# Patient Record
Sex: Female | Born: 1945 | Race: White | Hispanic: No | Marital: Married | Smoking: Former smoker
Health system: Southern US, Community
[De-identification: ages and names within clinical notes are randomized; demographics above are authoritative.]

## PROBLEM LIST (undated history)

## (undated) DIAGNOSIS — I10 Essential (primary) hypertension: Secondary | ICD-10-CM

## (undated) DIAGNOSIS — Z860100 Personal history of colon polyps, unspecified: Secondary | ICD-10-CM

## (undated) DIAGNOSIS — T4145XA Adverse effect of unspecified anesthetic, initial encounter: Secondary | ICD-10-CM

## (undated) DIAGNOSIS — T8859XA Other complications of anesthesia, initial encounter: Secondary | ICD-10-CM

## (undated) DIAGNOSIS — I82409 Acute embolism and thrombosis of unspecified deep veins of unspecified lower extremity: Secondary | ICD-10-CM

## (undated) DIAGNOSIS — K589 Irritable bowel syndrome without diarrhea: Secondary | ICD-10-CM

## (undated) DIAGNOSIS — R112 Nausea with vomiting, unspecified: Secondary | ICD-10-CM

## (undated) DIAGNOSIS — R011 Cardiac murmur, unspecified: Secondary | ICD-10-CM

## (undated) DIAGNOSIS — Z9889 Other specified postprocedural states: Secondary | ICD-10-CM

## (undated) DIAGNOSIS — M1990 Unspecified osteoarthritis, unspecified site: Secondary | ICD-10-CM

## (undated) DIAGNOSIS — R51 Headache: Secondary | ICD-10-CM

## (undated) DIAGNOSIS — R519 Headache, unspecified: Secondary | ICD-10-CM

## (undated) DIAGNOSIS — I809 Phlebitis and thrombophlebitis of unspecified site: Secondary | ICD-10-CM

## (undated) DIAGNOSIS — I739 Peripheral vascular disease, unspecified: Secondary | ICD-10-CM

## (undated) DIAGNOSIS — J449 Chronic obstructive pulmonary disease, unspecified: Secondary | ICD-10-CM

## (undated) DIAGNOSIS — Z8711 Personal history of peptic ulcer disease: Secondary | ICD-10-CM

## (undated) DIAGNOSIS — K219 Gastro-esophageal reflux disease without esophagitis: Secondary | ICD-10-CM

## (undated) HISTORY — PX: SHOULDER ARTHROSCOPY: SHX128

## (undated) HISTORY — PX: ANKLE SURGERY: SHX546

## (undated) HISTORY — PX: APPENDECTOMY: SHX54

## (undated) HISTORY — PX: FOOT SURGERY: SHX648

## (undated) HISTORY — PX: ESOPHAGOGASTRODUODENOSCOPY: SHX1529

## (undated) HISTORY — PX: COLONOSCOPY: SHX174

## (undated) HISTORY — PX: ABDOMINAL HYSTERECTOMY: SHX81

## (undated) HISTORY — PX: BACK SURGERY: SHX140

## (undated) HISTORY — PX: CHOLECYSTECTOMY: SHX55

---

## 1975-03-11 HISTORY — PX: ANKLE SURGERY: SHX546

## 1975-03-11 HISTORY — PX: OTHER SURGICAL HISTORY: SHX169

## 1985-03-10 HISTORY — PX: APPENDECTOMY: SHX54

## 1990-03-10 HISTORY — PX: CERVICAL FUSION: SHX112

## 1998-03-10 HISTORY — PX: OTHER SURGICAL HISTORY: SHX169

## 2002-03-10 HISTORY — PX: CHOLECYSTECTOMY: SHX55

## 2004-12-19 ENCOUNTER — Ambulatory Visit: Payer: Self-pay | Admitting: Internal Medicine

## 2005-04-18 ENCOUNTER — Ambulatory Visit: Payer: Self-pay | Admitting: Unknown Physician Specialty

## 2005-05-21 ENCOUNTER — Ambulatory Visit: Payer: Self-pay | Admitting: Cardiology

## 2006-01-27 ENCOUNTER — Ambulatory Visit: Payer: Self-pay | Admitting: Internal Medicine

## 2007-02-25 ENCOUNTER — Ambulatory Visit: Payer: Self-pay | Admitting: Internal Medicine

## 2008-02-29 ENCOUNTER — Ambulatory Visit: Payer: Self-pay | Admitting: Internal Medicine

## 2008-03-13 ENCOUNTER — Ambulatory Visit: Payer: Self-pay | Admitting: Internal Medicine

## 2009-03-14 ENCOUNTER — Ambulatory Visit: Payer: Self-pay | Admitting: Unknown Physician Specialty

## 2009-04-03 ENCOUNTER — Ambulatory Visit: Payer: Self-pay | Admitting: Unknown Physician Specialty

## 2009-06-11 ENCOUNTER — Ambulatory Visit: Payer: Self-pay | Admitting: Unknown Physician Specialty

## 2009-09-24 ENCOUNTER — Ambulatory Visit: Payer: Self-pay | Admitting: Unknown Physician Specialty

## 2010-03-19 ENCOUNTER — Ambulatory Visit: Payer: Self-pay | Admitting: Unknown Physician Specialty

## 2011-03-31 ENCOUNTER — Ambulatory Visit: Payer: Self-pay | Admitting: Unknown Physician Specialty

## 2012-03-10 HISTORY — PX: FOOT SURGERY: SHX648

## 2012-04-01 ENCOUNTER — Ambulatory Visit: Payer: Self-pay | Admitting: Podiatry

## 2012-04-07 ENCOUNTER — Emergency Department: Payer: Self-pay | Admitting: Emergency Medicine

## 2012-04-07 LAB — COMPREHENSIVE METABOLIC PANEL
Alkaline Phosphatase: 142 U/L — ABNORMAL HIGH (ref 50–136)
Anion Gap: 10 (ref 7–16)
BUN: 16 mg/dL (ref 7–18)
Calcium, Total: 9.2 mg/dL (ref 8.5–10.1)
Chloride: 106 mmol/L (ref 98–107)
EGFR (African American): >60
Osmolality: 285 (ref 275–301)
Potassium: 4.3 mmol/L (ref 3.5–5.1)
SGOT(AST): 92 U/L — ABNORMAL HIGH (ref 15–37)
SGPT (ALT): 110 U/L — ABNORMAL HIGH (ref 12–78)
Sodium: 141 mmol/L (ref 136–145)

## 2012-04-07 LAB — CBC
HCT: 38.9 % (ref 35.0–47.0)
HGB: 13.1 g/dL (ref 12.0–16.0)
MCHC: 33.8 g/dL (ref 32.0–36.0)
MCV: 100 fL (ref 80–100)
Platelet: 254 10*3/uL (ref 150–440)
WBC: 12.6 10*3/uL — ABNORMAL HIGH (ref 3.6–11.0)

## 2012-04-07 LAB — URINALYSIS, COMPLETE
Bilirubin,UR: NEGATIVE
Glucose,UR: NEGATIVE mg/dL (ref 0–75)
Ketone: NEGATIVE
Nitrite: NEGATIVE
Ph: 7 (ref 4.5–8.0)
Protein: NEGATIVE
RBC,UR: <1 /HPF (ref 0–5)
Specific Gravity: 1.009 (ref 1.003–1.030)
Squamous Epithelial: NONE SEEN
WBC UR: 1 /HPF (ref 0–5)

## 2012-04-19 ENCOUNTER — Ambulatory Visit: Payer: Self-pay | Admitting: Physician Assistant

## 2013-05-03 ENCOUNTER — Ambulatory Visit: Payer: Self-pay | Admitting: Physician Assistant

## 2013-05-16 ENCOUNTER — Ambulatory Visit: Payer: Self-pay | Admitting: Physician Assistant

## 2014-04-17 DIAGNOSIS — K582 Mixed irritable bowel syndrome: Secondary | ICD-10-CM | POA: Insufficient documentation

## 2014-04-17 DIAGNOSIS — Z86010 Personal history of colonic polyps: Secondary | ICD-10-CM | POA: Insufficient documentation

## 2014-05-05 ENCOUNTER — Ambulatory Visit: Payer: Self-pay | Admitting: Unknown Physician Specialty

## 2014-05-17 DIAGNOSIS — R519 Headache, unspecified: Secondary | ICD-10-CM | POA: Insufficient documentation

## 2014-05-17 DIAGNOSIS — R5382 Chronic fatigue, unspecified: Secondary | ICD-10-CM | POA: Insufficient documentation

## 2014-05-17 DIAGNOSIS — R202 Paresthesia of skin: Secondary | ICD-10-CM | POA: Insufficient documentation

## 2014-05-17 DIAGNOSIS — G2581 Restless legs syndrome: Secondary | ICD-10-CM | POA: Insufficient documentation

## 2014-07-03 LAB — SURGICAL PATHOLOGY

## 2014-11-15 ENCOUNTER — Other Ambulatory Visit: Payer: Self-pay | Admitting: Physician Assistant

## 2014-11-15 DIAGNOSIS — Z1231 Encounter for screening mammogram for malignant neoplasm of breast: Secondary | ICD-10-CM

## 2014-11-20 ENCOUNTER — Other Ambulatory Visit: Payer: Self-pay | Admitting: Physician Assistant

## 2014-11-20 ENCOUNTER — Ambulatory Visit
Admission: RE | Admit: 2014-11-20 | Discharge: 2014-11-20 | Disposition: A | Discharge status: Home / Self Care | Payer: Medicare Other | Source: Ambulatory Visit | Attending: Physician Assistant | Admitting: Physician Assistant

## 2014-11-20 DIAGNOSIS — Z1231 Encounter for screening mammogram for malignant neoplasm of breast: Secondary | ICD-10-CM

## 2015-02-12 ENCOUNTER — Emergency Department
Admission: EM | Admit: 2015-02-12 | Discharge: 2015-02-12 | Disposition: A | Discharge status: Home / Self Care | Payer: Medicare Other | Attending: Emergency Medicine | Admitting: Emergency Medicine

## 2015-02-12 ENCOUNTER — Emergency Department: Payer: Medicare Other

## 2015-02-12 DIAGNOSIS — R1013 Epigastric pain: Secondary | ICD-10-CM | POA: Diagnosis present

## 2015-02-12 DIAGNOSIS — Z91040 Latex allergy status: Secondary | ICD-10-CM | POA: Insufficient documentation

## 2015-02-12 LAB — TROPONIN I: Troponin I: <0.03 ng/mL (ref <0.031)

## 2015-02-12 LAB — COMPREHENSIVE METABOLIC PANEL
ALBUMIN: 4.5 g/dL (ref 3.5–5.0)
ALK PHOS: 62 U/L (ref 38–126)
ALT: 22 U/L (ref 14–54)
AST: 19 U/L (ref 15–41)
Anion gap: 6 (ref 5–15)
BUN: 11 mg/dL (ref 6–20)
CALCIUM: 9.7 mg/dL (ref 8.9–10.3)
CO2: 26 mmol/L (ref 22–32)
CREATININE: 0.75 mg/dL (ref 0.44–1.00)
Chloride: 106 mmol/L (ref 101–111)
GFR calc Af Amer: >60 mL/min (ref >60)
GLUCOSE: 101 mg/dL — AB (ref 65–99)
POTASSIUM: 3.7 mmol/L (ref 3.5–5.1)
Sodium: 138 mmol/L (ref 135–145)
TOTAL PROTEIN: 7.8 g/dL (ref 6.5–8.1)

## 2015-02-12 LAB — CBC
HEMATOCRIT: 38.3 % (ref 35.0–47.0)
HEMOGLOBIN: 13 g/dL (ref 12.0–16.0)
MCH: 34.2 pg — ABNORMAL HIGH (ref 26.0–34.0)
MCHC: 33.9 g/dL (ref 32.0–36.0)
MCV: 100.7 fL — AB (ref 80.0–100.0)
Platelets: 247 10*3/uL (ref 150–440)
RBC: 3.8 MIL/uL (ref 3.80–5.20)
RDW: 12.5 % (ref 11.5–14.5)
WBC: 5 10*3/uL (ref 3.6–11.0)

## 2015-02-12 LAB — LIPASE, BLOOD: LIPASE: 25 U/L (ref 11–51)

## 2015-02-12 MED ORDER — GI COCKTAIL ~~LOC~~
30.0000 mL | Freq: Once | ORAL | Status: AC
  Administered 2015-02-12: 30 mL via ORAL
  Filled 2015-02-12: qty 30

## 2015-02-12 NOTE — Discharge Instructions (Signed)
Abdominal Pain, Adult Many things can cause abdominal pain. Usually, abdominal pain is not caused by a disease and will improve without treatment. It can often be observed and treated at home. Your health care provider will do a physical exam and possibly order blood tests and X-rays to help determine the seriousness of your pain. However, in many cases, more time must pass before a clear cause of the pain can be found. Before that point, your health care provider may not know if you need more testing or further treatment. HOME CARE INSTRUCTIONS Monitor your abdominal pain for any changes. The following actions may help to alleviate any discomfort you are experiencing:  Only take over-the-counter or prescription medicines as directed by your health care provider.  Do not take laxatives unless directed to do so by your health care provider.  Try a clear liquid diet (broth, tea, or water) as directed by your health care provider. Slowly move to a bland diet as tolerated. SEEK MEDICAL CARE IF:  You have unexplained abdominal pain.  You have abdominal pain associated with nausea or diarrhea.  You have pain when you urinate or have a bowel movement.  You experience abdominal pain that wakes you in the night.  You have abdominal pain that is worsened or improved by eating food.  You have abdominal pain that is worsened with eating fatty foods.  You have a fever.  You have chest pain or shortness of breath SEEK IMMEDIATE MEDICAL CARE IF:  Your pain does not go away within 2 hours.  You keep throwing up (vomiting).  Your pain is felt only in portions of the abdomen, such as the right side or the left lower portion of the abdomen.  You pass bloody or black tarry stools. MAKE SURE YOU:  Understand these instructions.  Will watch your condition.  Will get help right away if you are not doing well or get worse.   This information is not intended to replace advice given to you by your  health care provider. Make sure you discuss any questions you have with your health care provider.   Document Released: 12/04/2004 Document Revised: 11/15/2014 Document Reviewed: 11/03/2012 Elsevier Interactive Patient Education [employer].

## 2015-02-12 NOTE — ED Notes (Signed)
Patient was on her way to [place] today. Patient was riding in car with her husband when symptoms began. States began with chest pressure in center of chest, radiated to back and then her mouth was numb. Numbness was gone after 5-10 minutes. Chest pressure has eased but still feels uncomfortable.

## 2015-02-12 NOTE — ED Provider Notes (Signed)
[facility] Emergency Department Provider Note  ____________________________________________   I have reviewed the triage vital signs and the nursing notes.   HISTORY  Chief Complaint Chest Pain    HPI Jeanette Harris is a 69 y.o. female with a history of "stomach problems", had multiple endoscopies and has had ulcers and polyps in her stomach. Patient also had a negative catheterization of her heart a few years ago. She has no history of CAD. 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 Patient states she has a leg somewhat spicy for lunch. Shortly thereafter around 1:00 she started having epigastric, not chest pain but epigastric pain which was a burning sensation that radiated towards her back and up towards her throat. She did have some burping associated with it. There was no radiation to the arms or legs no diaphoresis no shortness of breath no nausea no vomiting. The pain persists and is directly since that time. She has hadthis kind of pain with her stomach before. She states she did feel a burning on the back of her tongue. She denies any shortness of breath or dyspnea. She states that this is very similar to prior GI upset but she wants to be sure. She has not had any melena or bright red blood per rectum.  History reviewed. No pertinent past medical history.  There are no active problems to display for this patient.   History reviewed. No pertinent past surgical history.  No current outpatient prescriptions on file.  Allergies Ivp dye and Latex  Family History  Problem Relation Age of Onset  . Breast cancer Maternal Aunt     Social History Social History  Substance Use Topics  . Smoking status: Never Smoker   . Smokeless tobacco: None  . Alcohol Use: No    Review of Systems Constitutional: No fever/chills Eyes: No visual changes. ENT: No sore throat. No stiff neck no neck  pain Cardiovascular: Denies chest pain. Respiratory: Denies shortness of breath. Gastrointestinal:   no vomiting.  No diarrhea.  No constipation. Genitourinary: Negative for dysuria. Musculoskeletal: Negative lower extremity swelling Skin: Negative for rash. Neurological: Negative for headaches, focal weakness or numbness. 10-point ROS otherwise negative.  ____________________________________________   PHYSICAL EXAM:  VITAL SIGNS: ED Triage Vitals  Enc Vitals Group     BP 02/12/15 1334 131/70 mmHg     Pulse Rate 02/12/15 1334 82     Resp 02/12/15 1334 16     Temp 02/12/15 1334 98.3 F (36.8 C)     Temp Source 02/12/15 1334 Oral     SpO2 02/12/15 1334 98 %     Weight 02/12/15 1334 170 lb (77.111 kg)     Height 02/12/15 1334 5\' 5"  (1.651 m)     Head Cir --      Peak Flow --      Pain Score 02/12/15 1335 6     Pain Loc --      Pain Edu? --      Excl. in [place]? --     Constitutional: Alert and oriented. Well appearing and in no acute distress. Eyes: Conjunctivae are normal. PERRL. EOMI. Head: Atraumatic. Nose: No congestion/rhinnorhea. Mouth/Throat: Mucous membranes are moist.  Oropharynx non-erythematous. Neck: No stridor.   Nontender with no meningismus Cardiovascular: Normal rate, regular rhythm. Grossly normal heart sounds.  Good peripheral circulation. Respiratory: Normal respiratory effort.  No retractions. Lungs CTAB. Abdominal: There is positive mild tenderness to the  epigastric region which reproduces the patient's discomfort. By process the spot patient states "ouch that's the pain right there", there is no abdominal discomfort anywhere else in her abdomen on serial exams. No guarding no rebound Back:  There is no focal tenderness or step off there is no midline tenderness there are no lesions noted. there is no CVA tenderness Musculoskeletal: No lower extremity tenderness. No joint effusions, no DVT signs strong distal pulses no edema Neurologic:  Normal speech and  language. No gross focal neurologic deficits are appreciated.  Skin:  Skin is warm, dry and intact. No rash noted. Psychiatric: Mood and affect are normal. Speech and behavior are normal.  ____________________________________________   LABS (all labs ordered are listed, but only abnormal results are displayed)  Labs Reviewed  CBC - Abnormal; Notable for the following:    MCV 100.7 (*)    MCH 34.2 (*)    All other components within normal limits  COMPREHENSIVE METABOLIC PANEL - Abnormal; Notable for the following:    Glucose, Bld 101 (*)    Total Bilirubin <0.1 (*)    All other components within normal limits  TROPONIN I  TROPONIN I  LIPASE, BLOOD   ____________________________________________  EKG  I personally interpreted any EKGs ordered by me or triage EKG shows normal sinus rhythm, rate 85 bpm no acute ST elevation or acute ST depression normal axis unremarkable EKG ____________________________________________  RADIOLOGY  I reviewed any imaging ordered by me or triage that were performed during my shift ____________________________________________   PROCEDURES  Procedure(s) performed: None  Critical Care performed: None  ____________________________________________   INITIAL IMPRESSION / ASSESSMENT AND PLAN / ED COURSE  Pertinent labs & imaging results that were available during my care of the patient were reviewed by me and considered in my medical decision making (see chart for details).  Patient with reproducible epigastric abdominal discomfort who takes daily antiacid has a history of reflux and ulcer disease with no evidence of GI bleed. Low suspicion for ACS. Patient has had negative heart catheter in the past she states. We did do 2 sets of troponins which are negative. I do not feel that further cardiac workup is weren't at this time. Nor do I feel that further imaging of her abdomen is warranted. Patient has no pain the instant she took a GI cocktail she  felt better. While this is not diagnostic necessarily it certainly contributes to confirmatory supposition about her condition. The patient has been eager to go since she got here and now that her pain is completely gone she really would like to be discharged. She declines admission to the hospital and I do not think it is warranted anyway. We will discharge the patient with close follow-up with her doctor as well as her GI specialist all of whom she has information for already. Return precautions and follow-up given. ____________________________________________   FINAL CLINICAL IMPRESSION(S) / ED DIAGNOSES  Final diagnoses:  None     Schuyler Amor, MD 02/12/15 [phone]

## 2015-02-12 NOTE — ED Notes (Signed)
Sudden onset of chest pain at 13:15 with jaw numbness and dizziness.  Patient denies nausea, vomiting, or shortness of breath.

## 2015-07-07 ENCOUNTER — Ambulatory Visit
Admission: EM | Admit: 2015-07-07 | Discharge: 2015-07-07 | Disposition: A | Discharge status: Home / Self Care | Payer: Medicare Other | Attending: Family Medicine | Admitting: Family Medicine

## 2015-07-07 ENCOUNTER — Ambulatory Visit: Payer: Medicare Other

## 2015-07-07 DIAGNOSIS — S93402A Sprain of unspecified ligament of left ankle, initial encounter: Secondary | ICD-10-CM | POA: Diagnosis not present

## 2015-07-07 DIAGNOSIS — Z87891 Personal history of nicotine dependence: Secondary | ICD-10-CM | POA: Diagnosis not present

## 2015-07-07 DIAGNOSIS — W19XXXA Unspecified fall, initial encounter: Secondary | ICD-10-CM | POA: Insufficient documentation

## 2015-07-07 DIAGNOSIS — M79662 Pain in left lower leg: Secondary | ICD-10-CM | POA: Diagnosis present

## 2015-07-07 DIAGNOSIS — S8012XA Contusion of left lower leg, initial encounter: Secondary | ICD-10-CM | POA: Diagnosis not present

## 2015-07-07 NOTE — ED Notes (Signed)
Pain and swelling after dog tripped pt on Wednesday, causing her to fall. Pt is concerned because the swelling is not improving.

## 2015-07-07 NOTE — Discharge Instructions (Signed)
Take medication as prescribed. Rest. Apply ice and elevate. Wear brace at home as needed for support.   Follow up with orthopedic this week as needed for continued pain.   Follow up with your primary care physician this week as needed. Return to Urgent care for new or worsening concerns.    Ankle Sprain An ankle sprain is an injury to the strong, fibrous tissues (ligaments) that hold the bones of your ankle joint together.  CAUSES An ankle sprain is usually caused by a fall or by twisting your ankle. Ankle sprains most commonly occur when you step on the outer edge of your foot, and your ankle turns inward. People who participate in sports are more prone to these types of injuries.  SYMPTOMS   Pain in your ankle. The pain may be present at rest or only when you are trying to stand or walk.  Swelling.  Bruising. Bruising may develop immediately or within 1 to 2 days after your injury.  Difficulty standing or walking, particularly when turning corners or changing directions. DIAGNOSIS  Your caregiver will ask you details about your injury and perform a physical exam of your ankle to determine if you have an ankle sprain. During the physical exam, your caregiver will press on and apply pressure to specific areas of your foot and ankle. Your caregiver will try to move your ankle in certain ways. An X-ray exam may be done to be sure a bone was not broken or a ligament did not separate from one of the bones in your ankle (avulsion fracture).  TREATMENT  Certain types of braces can help stabilize your ankle. Your caregiver can make a recommendation for this. Your caregiver may recommend the use of medicine for pain. If your sprain is severe, your caregiver may refer you to a surgeon who helps to restore function to parts of your skeletal system (orthopedist) or a physical therapist. [place] ice to your injury for 1-2 days or as directed by your caregiver. Applying ice helps  to reduce inflammation and pain.  Put ice in a plastic bag.  Place a towel between your skin and the bag.  Leave the ice on for 15-20 minutes at a time, every 2 hours while you are awake.  Only take over-the-counter or prescription medicines for pain, discomfort, or fever as directed by your caregiver.  Elevate your injured ankle above the level of your heart as much as possible for 2-3 days.  If your caregiver recommends crutches, use them as instructed. Gradually put weight on the affected ankle. Continue to use crutches or a cane until you can walk without feeling pain in your ankle.  If you have a plaster splint, wear the splint as directed by your caregiver. Do not rest it on anything harder than a pillow for the first 24 hours. Do not put weight on it. Do not get it wet. You may take it off to take a shower or bath.  You may have been given an elastic bandage to wear around your ankle to provide support. If the elastic bandage is too tight (you have numbness or tingling in your foot or your foot becomes cold and blue), adjust the bandage to make it comfortable.  If you have an air splint, you may blow more air into it or let air out to make it more comfortable. You may take your splint off at night and before taking a shower or bath. Wiggle your toes in  the splint several times per day to decrease swelling. SEEK MEDICAL CARE IF:   You have rapidly increasing bruising or swelling.  Your toes feel extremely cold or you lose feeling in your foot.  Your pain is not relieved with medicine. SEEK IMMEDIATE MEDICAL CARE IF:  Your toes are numb or blue.  You have severe pain that is increasing. MAKE SURE YOU:   Understand these instructions.  Will watch your condition.  Will get help right away if you are not doing well or get worse.   This information is not intended to replace advice given to you by your health care provider. Make sure you discuss any questions you have with  your health care provider.   Document Released: 02/24/2005 Document Revised: 03/17/2014 Document Reviewed: 03/08/2011 Elsevier Interactive Patient Education 2016 [place] A contusion is a deep bruise. Contusions are the result of a blunt injury to tissues and muscle fibers under the skin. The injury causes bleeding under the skin. The skin overlying the contusion may turn blue, purple, or yellow. Minor injuries will give you a painless contusion, but more severe contusions may stay painful and swollen for a few weeks.  CAUSES  This condition is usually caused by a blow, trauma, or direct force to an area of the body. SYMPTOMS  Symptoms of this condition include:  Swelling of the injured area.  Pain and tenderness in the injured area.  Discoloration. The area may have redness and then turn blue, purple, or yellow. DIAGNOSIS  This condition is diagnosed based on a physical exam and medical history. An X-ray, CT scan, or MRI may be needed to determine if there are any associated injuries, such as broken bones (fractures). TREATMENT  Specific treatment for this condition depends on what area of the body was injured. In general, the best treatment for a contusion is resting, icing, applying pressure to (compression), and elevating the injured area. This is often called the RICE strategy. Over-the-counter anti-inflammatory medicines may also be recommended for pain control.  HOME CARE INSTRUCTIONS   Rest the injured area.  If directed, apply ice to the injured area:  Put ice in a plastic bag.  Place a towel between your skin and the bag.  Leave the ice on for 20 minutes, 2-3 times per day.  If directed, apply light compression to the injured area using an elastic bandage. Make sure the bandage is not wrapped too tightly. Remove and reapply the bandage as directed by your health care provider.  If possible, raise (elevate) the injured area above the level of your heart  while you are sitting or lying down.  Take over-the-counter and prescription medicines only as told by your health care provider. SEEK MEDICAL CARE IF:  Your symptoms do not improve after several days of treatment.  Your symptoms get worse.  You have difficulty moving the injured area. SEEK IMMEDIATE MEDICAL CARE IF:   You have severe pain.  You have numbness in a hand or foot.  Your hand or foot turns pale or cold.   This information is not intended to replace advice given to you by your health care provider. Make sure you discuss any questions you have with your health care provider.   Document Released: 12/04/2004 Document Revised: 11/15/2014 Document Reviewed: 07/12/2014 Elsevier Interactive Patient Education [employer].

## 2015-07-07 NOTE — ED Provider Notes (Signed)
[facility]  ____________________________________________  Time seen: Approximately 11:27 AM  I have reviewed the triage vital signs and the nursing notes.   HISTORY  Chief Complaint Ankle Pain    HPI Jeanette Harris is a 70 y.o. female presents with a complaint of left lower leg pain post injury. Patient reports that this past Wednesday evening she was outside and on her deck area reports that her dog which is a large puppy ran into her left lower leg causing her to roll her left ankle and fall. Denies any other pain or injury. Denies head injury or loss of consciousness. Reports that she came in today as she is concerned that she still has pain and swelling to left ankle and concern that it may be broken.  Patient reports that she has been able to continue to walk but with a limp. Patient states that pain at rest is mild but pain when walking increases. Denies any numbness or tingling sensation. Patient reports that she does have chronic decreased sensation to both lower legs but denies any changes acutely. Patient states that she only fell because the dog tripped her.  Denies weakness, dizziness, chest pain, shortness of breath, abdominal pain, neck pain, back pain, other extremity pain or other extremity swelling.    No past medical history on file.  There are no active problems to display for this patient.   No past surgical history on file.  Current Outpatient Rx  Name  Route  Sig  Dispense  Refill  . acetaminophen (TYLENOL) 500 MG tablet   Oral   Take 500 mg by mouth every 6 (six) hours as needed.         . calcium-vitamin D (OSCAL WITH D) 500-200 MG-UNIT tablet   Oral   Take 1 tablet by mouth.         . Cyanocobalamin (B-12 COMPLIANCE INJECTION IJ)   Injection   Inject as directed.         . docusate sodium (COLACE) 250 MG capsule   Oral   Take 250 mg by mouth daily.         Marland Kitchen gabapentin (NEURONTIN) 300 MG capsule   Oral   Take 300 mg by  mouth 3 (three) times daily.         . lansoprazole (PREVACID) 30 MG capsule   Oral   Take 30 mg by mouth daily at 12 noon.         . Multiple Vitamin (MULTIVITAMIN) capsule   Oral   Take 1 capsule by mouth daily.         . nortriptyline (PAMELOR) 75 MG capsule   Oral   Take 75 mg by mouth at bedtime.         . sucralfate (CARAFATE) 1 g tablet   Oral   Take 1 g by mouth 4 (four) times daily -  with meals and at bedtime.         . topiramate (TOPAMAX) 50 MG tablet   Oral   Take 50 mg by mouth 2 (two) times daily.           Allergies Aspirin; Ivp dye; Nsaids; and Latex  Family History  Problem Relation Age of Onset  . Breast cancer Maternal Aunt     Social History Social History  Substance Use Topics  . Smoking status: Former Research scientist (life sciences)  . Smokeless tobacco: None  . Alcohol Use: No    Review of Systems Constitutional: No fever/chills Eyes: No  visual changes. ENT: No sore throat. Cardiovascular: Denies chest pain. Respiratory: Denies shortness of breath. Gastrointestinal: No abdominal pain.  No nausea, no vomiting.  No diarrhea.  No constipation. Genitourinary: Negative for dysuria. Musculoskeletal: Negative for back pain.Positive left ankle pain. Skin: Negative for rash. Neurological: Negative for headaches, focal weakness or numbness.  10-point ROS otherwise negative.  ____________________________________________   PHYSICAL EXAM:  VITAL SIGNS: ED Triage Vitals  Enc Vitals Group     BP 07/07/15 1032 127/88 mmHg     Pulse Rate 07/07/15 1032 73     Resp 07/07/15 1032 16     Temp 07/07/15 1032 98 F (36.7 C)     Temp Source 07/07/15 1032 Oral     SpO2 07/07/15 1032 100 %     Weight 07/07/15 1032 170 lb (77.111 kg)     Height 07/07/15 1032 5\' 6"  (1.676 m)     Head Cir --      Peak Flow --      Pain Score 07/07/15 1034 7     Pain Loc --      Pain Edu? --      Excl. in [place]? --     Constitutional: Alert and oriented. Well appearing and in  no acute distress. Eyes: Conjunctivae are normal. PERRL. EOMI. Head: Atraumatic.  Nose: No congestion/rhinnorhea.  Mouth/Throat: Mucous membranes are moist.   Neck: No stridor.  No cervical spine tenderness to palpation. Cardiovascular: Normal rate, regular rhythm. Grossly normal heart sounds.  Good peripheral circulation. Respiratory: Normal respiratory effort.  No retractions. Lungs CTAB. No wheezes, rales or rhonchi. Good air movement. Gastrointestinal: Soft and nontender. Musculoskeletal: No lower or upper extremity tenderness nor edema.  Bilateral pedal pulses equal and easily palpated.  No cervical, thoracic or lumbar tenderness to palpation. Except: Left lateral mid to distal fibula mild tenderness to palpation, lateral malleolus moderate tenderness to palpation with mild swelling, lateral  proximal foot mild to moderate tenderness to palpation with mild swelling, mild ecchymosis, pain with ankle rotation and left foot plantar flexion and dorsiflexion, sensation intact bilaterally, capillary refill less than 2 seconds to all toes bilateral feet, no motor or tendon deficits to left foot. Mild antalgic gait.  Neurologic:  Normal speech and language. No gross focal neurologic deficits are appreciated. No gait instability. Skin:  Skin is warm, dry and intact. No rash noted. Psychiatric: Mood and affect are normal. Speech and behavior are normal.  ____________________________________________   LABS (all labs ordered are listed, but only abnormal results are displayed)  Labs Reviewed - No data to display  RADIOLOGY   EXAM: LEFT TIBIA AND FIBULA - 2 VIEW  COMPARISON: None.  FINDINGS: There is no evidence of fracture or other focal bone lesions. Soft tissues are unremarkable.  IMPRESSION: Negative.  Signed,  Dulcy Fanny. Earleen Newport, DO  [facility]  [facility]   Electronically Signed By: Corrie Mckusick D.O. On: 07/07/2015  11:56          DG Foot Complete Left (Final result) Result time: 07/07/15 11:57:42   Final result by Rad Results In Interface (07/07/15 11:57:42)   Narrative:   CLINICAL DATA: 70 year old female with a history of fall  EXAM: LEFT FOOT - COMPLETE 3+ VIEW  COMPARISON: None.  FINDINGS: No acute fracture.  Surgical changes of fixation of the left fifth metatarsal, as well as at the head of the second metatarsal.  No unexpected radiopaque foreign body. No focal soft tissue swelling. Degenerative changes of the interphalangeal  joints  IMPRESSION: Negative for acute bony abnormality.  Signed,  Dulcy Fanny. Earleen Newport, DO  [facility]  [facility]   Electronically Signed By: Corrie Mckusick D.O. On: 07/07/2015 11:57   I, Marylene Land, personally viewed and evaluated these images (plain radiographs) as part of my medical decision making, as well as reviewing the written report by the radiologist.  ____________________________________________   PROCEDURES  Procedure(s) performed:  Denies need for splint or crutches  _________________________________________   INITIAL IMPRESSION / [place] / ED COURSE  Pertinent labs & imaging results that were available during my care of the patient were reviewed by me and considered in my medical decision making (see chart for details).  Very well-appearing patient. No acute distress. Presents for the complaints of left lower extremity pain post mechanical fall and injury. Reports only feel because her dog ran into her ankle. Denies other pain or injury. Will evaluate left tib-fib and left foot x-rays.  Left tibia and fibula and left foot x-ray is negative for acute bony abnormality per radiology. Patient states that she has a ankle brace support at home, denies need for splinting, bracing or crutches in urgent care. Denies need for pain medication. Encouraged rest, ice,  elevation and follow up with PCP or orthopedics as needed for continued pain.  Discussed follow up with Primary care physician this week. Discussed follow up and return parameters including no resolution or any worsening concerns. Patient verbalized understanding and agreed to plan.   ____________________________________________    FINAL CLINICAL IMPRESSION(S) / ED DIAGNOSES  Final diagnoses:  Left ankle sprain, initial encounter  Multiple leg contusions, left, initial encounter      Note: This dictation was prepared with Dragon dictation along with smaller phrase technology. Any transcriptional errors that result from this process are unintentional.    Marylene Land, NP 07/07/15 1320

## 2015-11-27 ENCOUNTER — Other Ambulatory Visit: Payer: Self-pay | Admitting: Physician Assistant

## 2015-11-27 DIAGNOSIS — Z1231 Encounter for screening mammogram for malignant neoplasm of breast: Secondary | ICD-10-CM

## 2015-12-17 ENCOUNTER — Ambulatory Visit
Admission: RE | Admit: 2015-12-17 | Discharge: 2015-12-17 | Disposition: A | Discharge status: Home / Self Care | Payer: Medicare Other | Source: Ambulatory Visit | Attending: Physician Assistant | Admitting: Physician Assistant

## 2015-12-17 DIAGNOSIS — Z1231 Encounter for screening mammogram for malignant neoplasm of breast: Secondary | ICD-10-CM | POA: Diagnosis present

## 2016-02-07 NOTE — Discharge Instructions (Signed)
Cataract Surgery, Care After °Refer to this sheet in the next few weeks. These instructions provide you with information about caring for yourself after your procedure. Your health care provider may also give you more specific instructions. Your treatment has been planned according to current medical practices, but problems sometimes occur. Call your health care provider if you have any problems or questions after your procedure. °What can I expect after the procedure? °After the procedure, it is common to have: °· Itching. °· Discomfort. °· Fluid discharge. °· Sensitivity to light and to touch. °· Bruising. °Follow these instructions at home: °Eye Care  °· Check your eye every day for signs of infection. Watch for: °¨ Redness, swelling, or pain. °¨ Fluid, blood, or pus. °¨ Warmth. °¨ Bad smell. °Activity  °· Avoid strenuous activities, such as playing contact sports, for as long as told by your health care provider. °· Do not drive or operate heavy machinery until your health care provider approves. °· Do not bend or lift heavy objects . Bending increases pressure in the eye. You can walk, climb stairs, and do light household chores. °· Ask your health care provider when you can return to work. If you work in a dusty environment, you may be advised to wear protective eyewear for a period of time. °General instructions  °· Take or apply over-the-counter and prescription medicines only as told by your health care provider. This includes eye drops. °· Do not touch or rub your eyes. °· If you were given a protective shield, wear it as told by your health care provider. If you were not given a protective shield, wear sunglasses as told by your health care provider to protect your eyes. °· Keep the area around your eye clean and dry. Avoid swimming or allowing water to hit you directly in the face while showering until told by your health care provider. Keep soap and shampoo out of your eyes. °· Do not put a contact lens  into the affected eye or eyes until your health care provider approves. °· Keep all follow-up visits as told by your health care provider. This is important. °Contact a health care provider if: ° °· You have increased bruising around your eye. °· You have pain that is not helped with medicine. °· You have a fever. °· You have redness, swelling, or pain in your eye. °· You have fluid, blood, or pus coming from your incision. °· Your vision gets worse. °Get help right away if: °· You have sudden vision loss. °This information is not intended to replace advice given to you by your health care provider. Make sure you discuss any questions you have with your health care provider. °Document Released: 09/13/2004 Document Revised: 07/05/2015 Document Reviewed: 01/04/2015 °Elsevier Interactive Patient Education © 2017 Elsevier Inc[employer] ° ° ° ° °General Anesthesia, Adult, Care After °These instructions provide you with information about caring for yourself after your procedure. Your health care provider may also give you more specific instructions. Your treatment has been planned according to current medical practices, but problems sometimes occur. Call your health care provider if you have any problems or questions after your procedure. °What can I expect after the procedure? °After the procedure, it is common to have: °· Vomiting. °· A sore throat. °· Mental slowness. °It is common to feel: °· Nauseous. °· Cold or shivery. °· Sleepy. °· Tired. °· Sore or achy, even in parts of your body where you did not have surgery. °Follow these instructions at   home: °For at least 24 hours after the procedure:  °· Do not: °¨ Participate in activities where you could fall or become injured. °¨ Drive. °¨ Use heavy machinery. °¨ Drink alcohol. °¨ Take sleeping pills or medicines that cause drowsiness. °¨ Make important decisions or sign legal documents. °¨ Take care of children on your own. °· Rest. °Eating and drinking  °· If you vomit, drink  water, juice, or soup when you can drink without vomiting. °· Drink enough fluid to keep your urine clear or pale yellow. °· Make sure you have little or no nausea before eating solid foods. °· Follow the diet recommended by your health care provider. °General instructions  °· Have a responsible adult stay with you until you are awake and alert. °· Return to your normal activities as told by your health care provider. Ask your health care provider what activities are safe for you. °· Take over-the-counter and prescription medicines only as told by your health care provider. °· If you smoke, do not smoke without supervision. °· Keep all follow-up visits as told by your health care provider. This is important. °Contact a health care provider if: °· You continue to have nausea or vomiting at home, and medicines are not helpful. °· You cannot drink fluids or start eating again. °· You cannot urinate after 8-12 hours. °· You develop a skin rash. °· You have fever. °· You have increasing redness at the site of your procedure. °Get help right away if: °· You have difficulty breathing. °· You have chest pain. °· You have unexpected bleeding. °· You feel that you are having a life-threatening or urgent problem. °This information is not intended to replace advice given to you by your health care provider. Make sure you discuss any questions you have with your health care provider. °Document Released: 06/02/2000 Document Revised: 07/30/2015 Document Reviewed: 02/08/2015 °Elsevier Interactive Patient Education © 2017 Elsevier Inc. ° °

## 2016-02-13 ENCOUNTER — Encounter
Admission: RE | Disposition: A | Discharge status: Home / Self Care | Payer: Self-pay | Source: Ambulatory Visit | Attending: Ophthalmology

## 2016-02-13 ENCOUNTER — Ambulatory Visit
Admission: RE | Admit: 2016-02-13 | Discharge: 2016-02-13 | Disposition: A | Discharge status: Home / Self Care | Payer: Medicare Other | Source: Ambulatory Visit | Attending: Ophthalmology | Admitting: Ophthalmology

## 2016-02-13 ENCOUNTER — Ambulatory Visit: Payer: Medicare Other | Admitting: Anesthesiology

## 2016-02-13 DIAGNOSIS — H2512 Age-related nuclear cataract, left eye: Secondary | ICD-10-CM | POA: Diagnosis present

## 2016-02-13 DIAGNOSIS — K219 Gastro-esophageal reflux disease without esophagitis: Secondary | ICD-10-CM | POA: Diagnosis not present

## 2016-02-13 DIAGNOSIS — Z87891 Personal history of nicotine dependence: Secondary | ICD-10-CM | POA: Insufficient documentation

## 2016-02-13 DIAGNOSIS — J449 Chronic obstructive pulmonary disease, unspecified: Secondary | ICD-10-CM | POA: Insufficient documentation

## 2016-02-13 HISTORY — DX: Peripheral vascular disease, unspecified: I73.9

## 2016-02-13 HISTORY — DX: Phlebitis and thrombophlebitis of unspecified site: I80.9

## 2016-02-13 HISTORY — DX: Gastro-esophageal reflux disease without esophagitis: K21.9

## 2016-02-13 HISTORY — PX: CATARACT EXTRACTION W/PHACO: SHX586

## 2016-02-13 HISTORY — DX: Adverse effect of unspecified anesthetic, initial encounter: T41.45XA

## 2016-02-13 HISTORY — DX: Other complications of anesthesia, initial encounter: T88.59XA

## 2016-02-13 HISTORY — DX: Cardiac murmur, unspecified: R01.1

## 2016-02-13 HISTORY — DX: Other specified postprocedural states: Z98.890

## 2016-02-13 HISTORY — DX: Chronic obstructive pulmonary disease, unspecified: J44.9

## 2016-02-13 HISTORY — DX: Nausea with vomiting, unspecified: R11.2

## 2016-02-13 HISTORY — DX: Headache, unspecified: R51.9

## 2016-02-13 HISTORY — DX: Unspecified osteoarthritis, unspecified site: M19.90

## 2016-02-13 HISTORY — DX: Acute embolism and thrombosis of unspecified deep veins of unspecified lower extremity: I82.409

## 2016-02-13 HISTORY — DX: Headache: R51

## 2016-02-13 SURGERY — PHACOEMULSIFICATION, CATARACT, WITH IOL INSERTION
Anesthesia: Monitor Anesthesia Care | Laterality: Left | Wound class: Clean

## 2016-02-13 MED ORDER — EPINEPHRINE PF 1 MG/ML IJ SOLN
INTRAOCULAR | Status: DC | PRN
  Administered 2016-02-13: 63 mL via OPHTHALMIC

## 2016-02-13 MED ORDER — POLYMYXIN B-TRIMETHOPRIM 10000-0.1 UNIT/ML-% OP SOLN
1.0000 [drp] | OPHTHALMIC | Status: AC
  Administered 2016-02-13: 1 [drp] via OPHTHALMIC

## 2016-02-13 MED ORDER — TIMOLOL MALEATE 0.5 % OP SOLN
OPHTHALMIC | Status: DC | PRN
  Administered 2016-02-13: 1 [drp] via OPHTHALMIC

## 2016-02-13 MED ORDER — LIDOCAINE HCL (PF) 4 % IJ SOLN
INTRAOCULAR | Status: DC | PRN
  Administered 2016-02-13: 1 mL via OPHTHALMIC

## 2016-02-13 MED ORDER — CEFUROXIME OPHTHALMIC INJECTION 1 MG/0.1 ML
INJECTION | OPHTHALMIC | Status: DC | PRN
  Administered 2016-02-13: 0.1 mL via OPHTHALMIC

## 2016-02-13 MED ORDER — POLYMYXIN B-TRIMETHOPRIM 10000-0.1 UNIT/ML-% OP SOLN
1.0000 [drp] | OPHTHALMIC | Status: DC | PRN
  Administered 2016-02-13 (×2): 1 [drp] via OPHTHALMIC

## 2016-02-13 MED ORDER — MOXIFLOXACIN HCL 0.5 % OP SOLN: 1.0000 [drp] | OPHTHALMIC | Status: DC | PRN

## 2016-02-13 MED ORDER — ACETAMINOPHEN 160 MG/5ML PO SOLN: 325.0000 mg | ORAL | Status: DC | PRN

## 2016-02-13 MED ORDER — MIDAZOLAM HCL 2 MG/2ML IJ SOLN
INTRAMUSCULAR | Status: DC | PRN
  Administered 2016-02-13: 2 mg via INTRAVENOUS

## 2016-02-13 MED ORDER — FENTANYL CITRATE (PF) 100 MCG/2ML IJ SOLN
INTRAMUSCULAR | Status: DC | PRN
  Administered 2016-02-13: 100 ug via INTRAVENOUS

## 2016-02-13 MED ORDER — BRIMONIDINE TARTRATE 0.2 % OP SOLN
OPHTHALMIC | Status: DC | PRN
  Administered 2016-02-13: 1 [drp] via OPHTHALMIC

## 2016-02-13 MED ORDER — NA HYALUR & NA CHOND-NA HYALUR 0.4-0.35 ML IO KIT
PACK | INTRAOCULAR | Status: DC | PRN
  Administered 2016-02-13: 1 mL via INTRAOCULAR

## 2016-02-13 MED ORDER — ACETAMINOPHEN 325 MG PO TABS
325.0000 mg | ORAL_TABLET | ORAL | Status: DC | PRN
  Administered 2016-02-13: 650 mg via ORAL

## 2016-02-13 MED ORDER — ONDANSETRON HCL 4 MG/2ML IJ SOLN
INTRAMUSCULAR | Status: DC | PRN
  Administered 2016-02-13: 4 mg via INTRAVENOUS

## 2016-02-13 MED ORDER — ARMC OPHTHALMIC DILATING DROPS
1.0000 "application " | OPHTHALMIC | Status: DC | PRN
  Administered 2016-02-13 (×3): 1 via OPHTHALMIC

## 2016-02-13 SURGICAL SUPPLY — 27 items
CANNULA ANT/CHMB 27GA (MISCELLANEOUS) ×3 IMPLANT
CARTRIDGE ABBOTT (MISCELLANEOUS) IMPLANT
GLOVE SURG LX 7.5 STRW (GLOVE) ×2
GLOVE SURG LX STRL 7.5 STRW (GLOVE) ×1 IMPLANT
GLOVE SURG TRIUMPH 8.0 PF LTX (GLOVE) ×3 IMPLANT
GOWN STRL REUS W/ TWL LRG LVL3 (GOWN DISPOSABLE) ×2 IMPLANT
GOWN STRL REUS W/TWL LRG LVL3 (GOWN DISPOSABLE) ×4
LENS IOL ACRSF IQ TRC 5 21.5 (Intraocular Lens) ×1 IMPLANT
LENS IOL ACRYSOF IQ TORIC 21.5 (Intraocular Lens) ×2 IMPLANT
LENS IOL IQ TORIC 5 21.5 (Intraocular Lens) ×1 IMPLANT
MARKER SKIN DUAL TIP RULER LAB (MISCELLANEOUS) ×3 IMPLANT
NDL RETROBULBAR .5 NSTRL (NEEDLE) IMPLANT
NEEDLE FILTER BLUNT 18X 1/2SAF (NEEDLE) ×2
NEEDLE FILTER BLUNT 18X1 1/2 (NEEDLE) ×1 IMPLANT
PACK CATARACT BRASINGTON (MISCELLANEOUS) ×3 IMPLANT
PACK EYE AFTER SURG (MISCELLANEOUS) ×3 IMPLANT
PACK OPTHALMIC (MISCELLANEOUS) ×3 IMPLANT
RING MALYGIN 7.0 (MISCELLANEOUS) IMPLANT
SUT ETHILON 10-0 CS-B-6CS-B-6 (SUTURE)
SUT VICRYL  9 0 (SUTURE)
SUT VICRYL 9 0 (SUTURE) IMPLANT
SUTURE EHLN 10-0 CS-B-6CS-B-6 (SUTURE) IMPLANT
SYR 3ML LL SCALE MARK (SYRINGE) ×3 IMPLANT
SYR 5ML LL (SYRINGE) ×3 IMPLANT
SYR TB 1ML LUER SLIP (SYRINGE) ×3 IMPLANT
WATER STERILE IRR 250ML POUR (IV SOLUTION) ×3 IMPLANT
WIPE NON LINTING 3.25X3.25 (MISCELLANEOUS) ×3 IMPLANT

## 2016-02-13 NOTE — Op Note (Signed)
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   PROCEDURE:  Phacoemulsification with Toric posterior chamber intraocular lens placement of the left eye.   LENS:  Implant Name Type Inv. Item Serial No. Manufacturer Lot No. LRB No. Used  [id number] 21.5D     [id number] ALCON   Left 1   Toric intraocular lens with 3.0 diopters of cylindrical power with axis orientation at 73 degrees.   ULTRASOUND TIME: 13 % of 1 minutes, 8 seconds.  CDE 8.7   SURGEON:  Wyonia Hough, MD   ANESTHESIA:  Topical with tetracaine drops and 2% Xylocaine jelly, augmented with 1% preservative-free intracameral lidocaine.  COMPLICATIONS:  None.   DESCRIPTION OF PROCEDURE:  The patient was identified in the holding room and transported to the operating suite and placed in the supine position under the operating microscope.  The left eye was identified as the operative eye, and it was prepped and draped in the usual sterile ophthalmic fashion.    A clear-corneal paracentesis incision was made at the 1:30 position.  0.5 ml of preservative-free 1% lidocaine was injected into the anterior chamber. The anterior chamber was filled with Viscoat.  A 2.4 millimeter near clear corneal incision was then made at the 10:30 position.  A cystotome and capsulorrhexis forceps were then used to make a curvilinear capsulorrhexis.  Hydrodissection and hydrodelineation were then performed using balanced salt solution.   Phacoemulsification was then used in stop and chop fashion to remove the lens, nucleus and epinucleus.  The remaining cortex was aspirated using the irrigation and aspiration handpiece.  Provisc viscoelastic was then placed into the capsular bag to distend it for lens placement.  The Verion digital marker was used to align the implant at the intended axis.   A 21.5 diopter lens was  then injected into the capsular bag.  It was rotated clockwise until the axis marks on the lens were approximately 15 degrees in the counterclockwise direction to the intended alignment.  The viscoelastic was aspirated from the eye using the irrigation aspiration handpiece.  Then, a Koch spatula through the sideport incision was used to rotate the lens in a clockwise direction until the axis markings of the intraocular lens were lined up with the Verion alignment.  Balanced salt solution was then used to hydrate the wounds. Cefuroxime 0.1 ml of a 10mg /ml solution was injected into the anterior chamber for a dose of 1 mg of intracameral antibiotic at the completion of the case.    The eye was noted to have a physiologic pressure and there was no wound leak noted.   Timolol and Brimonidine drops were applied to the eye.  The patient was taken to the recovery room in stable condition having had no complications of anesthesia or surgery.  Jeanette Harris 02/13/2016, 11:32 AM

## 2016-02-13 NOTE — Anesthesia Procedure Notes (Signed)
Procedure Name: MAC Performed by: Corben Auzenne Pre-anesthesia Checklist: Patient identified, Emergency Drugs available, Suction available, Timeout performed and Patient being monitored Patient Re-evaluated:Patient Re-evaluated prior to inductionOxygen Delivery Method: Nasal cannula Placement Confirmation: positive ETCO2       

## 2016-02-13 NOTE — H&P (Signed)
The History and Physical notes are on paper, have been signed, and are to be scanned. The patient remains stable and unchanged from the H&P.   Previous H&P reviewed, patient examined, and there are no changes.  Dali Kraner 02/13/2016 10:11 AM

## 2016-02-13 NOTE — Transfer of Care (Signed)
Immediate Anesthesia Transfer of Care Note  Patient: Jeanette Harris  Procedure(s) Performed: Procedure(s) with comments: CATARACT EXTRACTION PHACO AND INTRAOCULAR LENS PLACEMENT (IOC) (Left) - TORIC  Patient Location: PACU  Anesthesia Type: MAC  Level of Consciousness: awake, alert  and patient cooperative  Airway and Oxygen Therapy: Patient Spontanous Breathing and Patient connected to supplemental oxygen  Post-op Assessment: Post-op Vital signs reviewed, Patient's Cardiovascular Status Stable, Respiratory Function Stable, Patent Airway and No signs of Nausea or vomiting  Post-op Vital Signs: Reviewed and stable  Complications: No apparent anesthesia complications

## 2016-02-13 NOTE — Anesthesia Preprocedure Evaluation (Signed)
Anesthesia Evaluation  Patient identified by MRN, date of birth, ID band Patient awake    Reviewed: Allergy & Precautions, H&P , NPO status , Patient's Chart, lab work & pertinent test results  Airway Mallampati: II  TM Distance: >3 FB Neck ROM: full    Dental no notable dental hx.    Pulmonary COPD, former smoker,    Pulmonary exam normal        Cardiovascular Normal cardiovascular exam     Neuro/Psych    GI/Hepatic GERD  ,  Endo/Other    Renal/GU      Musculoskeletal   Abdominal   Peds  Hematology   Anesthesia Other Findings   Reproductive/Obstetrics                             Anesthesia Physical Anesthesia Plan  ASA: II  Anesthesia Plan: MAC   Post-op Pain Management:    Induction:   Airway Management Planned:   Additional Equipment:   Intra-op Plan:   Post-operative Plan:   Informed Consent: I have reviewed the patients History and Physical, chart, labs and discussed the procedure including the risks, benefits and alternatives for the proposed anesthesia with the patient or authorized representative who has indicated his/her understanding and acceptance.     Plan Discussed with:   Anesthesia Plan Comments:         Anesthesia Quick Evaluation

## 2016-02-13 NOTE — Anesthesia Postprocedure Evaluation (Signed)
Anesthesia Post Note  Patient: Jeanette Harris  Procedure(s) Performed: Procedure(s) (LRB): CATARACT EXTRACTION PHACO AND INTRAOCULAR LENS PLACEMENT (IOC) (Left)  Patient location during evaluation: PACU Anesthesia Type: MAC Level of consciousness: awake and alert and oriented Pain management: satisfactory to patient Vital Signs Assessment: post-procedure vital signs reviewed and stable Respiratory status: spontaneous breathing, nonlabored ventilation and respiratory function stable Cardiovascular status: blood pressure returned to baseline and stable Postop Assessment: Adequate PO intake and No signs of nausea or vomiting Anesthetic complications: no    Raliegh Ip

## 2016-02-14 ENCOUNTER — Encounter: Payer: Self-pay | Admitting: Ophthalmology

## 2016-02-25 ENCOUNTER — Encounter: Payer: Self-pay | Admitting: *Deleted

## 2016-02-27 ENCOUNTER — Ambulatory Visit: Payer: Medicare Other | Admitting: Anesthesiology

## 2016-02-27 ENCOUNTER — Encounter
Admission: RE | Disposition: A | Discharge status: Home / Self Care | Payer: Self-pay | Source: Ambulatory Visit | Attending: Ophthalmology

## 2016-02-27 ENCOUNTER — Ambulatory Visit
Admission: RE | Admit: 2016-02-27 | Discharge: 2016-02-27 | Disposition: A | Discharge status: Home / Self Care | Payer: Medicare Other | Source: Ambulatory Visit | Attending: Ophthalmology | Admitting: Ophthalmology

## 2016-02-27 DIAGNOSIS — R011 Cardiac murmur, unspecified: Secondary | ICD-10-CM | POA: Insufficient documentation

## 2016-02-27 DIAGNOSIS — M1990 Unspecified osteoarthritis, unspecified site: Secondary | ICD-10-CM | POA: Insufficient documentation

## 2016-02-27 DIAGNOSIS — H2511 Age-related nuclear cataract, right eye: Secondary | ICD-10-CM | POA: Insufficient documentation

## 2016-02-27 DIAGNOSIS — Z87891 Personal history of nicotine dependence: Secondary | ICD-10-CM | POA: Diagnosis not present

## 2016-02-27 DIAGNOSIS — J449 Chronic obstructive pulmonary disease, unspecified: Secondary | ICD-10-CM | POA: Diagnosis not present

## 2016-02-27 DIAGNOSIS — K219 Gastro-esophageal reflux disease without esophagitis: Secondary | ICD-10-CM | POA: Insufficient documentation

## 2016-02-27 HISTORY — PX: CATARACT EXTRACTION W/PHACO: SHX586

## 2016-02-27 SURGERY — PHACOEMULSIFICATION, CATARACT, WITH IOL INSERTION
Anesthesia: Monitor Anesthesia Care | Laterality: Right | Wound class: Clean

## 2016-02-27 MED ORDER — BRIMONIDINE TARTRATE 0.2 % OP SOLN
OPHTHALMIC | Status: DC | PRN
  Administered 2016-02-27: 1 [drp] via OPHTHALMIC

## 2016-02-27 MED ORDER — TIMOLOL MALEATE 0.5 % OP SOLN
OPHTHALMIC | Status: DC | PRN
  Administered 2016-02-27: 1 [drp] via OPHTHALMIC

## 2016-02-27 MED ORDER — CEFUROXIME OPHTHALMIC INJECTION 1 MG/0.1 ML
INJECTION | OPHTHALMIC | Status: DC | PRN
  Administered 2016-02-27: .3 mL via OPHTHALMIC

## 2016-02-27 MED ORDER — MOXIFLOXACIN HCL 0.5 % OP SOLN
1.0000 [drp] | OPHTHALMIC | Status: DC | PRN
  Administered 2016-02-27 (×3): 1 [drp] via OPHTHALMIC

## 2016-02-27 MED ORDER — ACETAMINOPHEN 325 MG PO TABS
325.0000 mg | ORAL_TABLET | ORAL | Status: DC | PRN
Start: 2016-02-27 — End: 2016-02-27

## 2016-02-27 MED ORDER — NA HYALUR & NA CHOND-NA HYALUR 0.4-0.35 ML IO KIT
PACK | INTRAOCULAR | Status: DC | PRN
  Administered 2016-02-27: 1 mL via INTRAOCULAR

## 2016-02-27 MED ORDER — EPINEPHRINE PF 1 MG/ML IJ SOLN
INTRAMUSCULAR | Status: DC | PRN
  Administered 2016-02-27: 60 mL via OPHTHALMIC

## 2016-02-27 MED ORDER — MIDAZOLAM HCL 2 MG/2ML IJ SOLN
INTRAMUSCULAR | Status: DC | PRN
  Administered 2016-02-27: 2 mg via INTRAVENOUS

## 2016-02-27 MED ORDER — LIDOCAINE HCL (PF) 4 % IJ SOLN
INTRAOCULAR | Status: DC | PRN
  Administered 2016-02-27: 1.5 mL via OPHTHALMIC

## 2016-02-27 MED ORDER — ACETAMINOPHEN 160 MG/5ML PO SOLN: 325.0000 mg | ORAL | Status: DC | PRN

## 2016-02-27 MED ORDER — LACTATED RINGERS IV SOLN: INTRAVENOUS | Status: DC

## 2016-02-27 MED ORDER — ARMC OPHTHALMIC DILATING DROPS
1.0000 | OPHTHALMIC | Status: DC | PRN
Start: 2016-02-27 — End: 2016-02-27
  Administered 2016-02-27 (×2): 1 via OPHTHALMIC

## 2016-02-27 MED ORDER — FENTANYL CITRATE (PF) 100 MCG/2ML IJ SOLN
INTRAMUSCULAR | Status: DC | PRN
  Administered 2016-02-27: 100 ug via INTRAVENOUS

## 2016-02-27 SURGICAL SUPPLY — 27 items
CANNULA ANT/CHMB 27GA (MISCELLANEOUS) ×3 IMPLANT
CARTRIDGE ABBOTT (MISCELLANEOUS) IMPLANT
GLOVE SURG LX 7.5 STRW (GLOVE) ×2
GLOVE SURG LX STRL 7.5 STRW (GLOVE) ×1 IMPLANT
GLOVE SURG TRIUMPH 8.0 PF LTX (GLOVE) ×3 IMPLANT
GOWN STRL REUS W/ TWL LRG LVL3 (GOWN DISPOSABLE) ×2 IMPLANT
GOWN STRL REUS W/TWL LRG LVL3 (GOWN DISPOSABLE) ×4
LENS IOL ACRSF IQ TRC 5 20.5 (Intraocular Lens) ×1 IMPLANT
LENS IOL ACRYSOF IQ TORIC 20.5 (Intraocular Lens) ×2 IMPLANT
LENS IOL IQ TORIC 5 20.5 (Intraocular Lens) ×1 IMPLANT
MARKER SKIN DUAL TIP RULER LAB (MISCELLANEOUS) ×3 IMPLANT
NDL RETROBULBAR .5 NSTRL (NEEDLE) IMPLANT
NEEDLE FILTER BLUNT 18X 1/2SAF (NEEDLE) ×2
NEEDLE FILTER BLUNT 18X1 1/2 (NEEDLE) ×1 IMPLANT
PACK CATARACT BRASINGTON (MISCELLANEOUS) ×3 IMPLANT
PACK EYE AFTER SURG (MISCELLANEOUS) ×3 IMPLANT
PACK OPTHALMIC (MISCELLANEOUS) ×3 IMPLANT
RING MALYGIN 7.0 (MISCELLANEOUS) IMPLANT
SUT ETHILON 10-0 CS-B-6CS-B-6 (SUTURE)
SUT VICRYL  9 0 (SUTURE)
SUT VICRYL 9 0 (SUTURE) IMPLANT
SUTURE EHLN 10-0 CS-B-6CS-B-6 (SUTURE) IMPLANT
SYR 3ML LL SCALE MARK (SYRINGE) ×3 IMPLANT
SYR 5ML LL (SYRINGE) ×3 IMPLANT
SYR TB 1ML LUER SLIP (SYRINGE) ×3 IMPLANT
WATER STERILE IRR 250ML POUR (IV SOLUTION) ×3 IMPLANT
WIPE NON LINTING 3.25X3.25 (MISCELLANEOUS) ×3 IMPLANT

## 2016-02-27 NOTE — Anesthesia Preprocedure Evaluation (Signed)
Anesthesia Evaluation  Patient identified by MRN, date of birth, ID band Patient awake    Reviewed: Allergy & Precautions, H&P , NPO status , Patient's Chart, lab work & pertinent test results  Airway Mallampati: II  TM Distance: >3 FB Neck ROM: full    Dental no notable dental hx.    Pulmonary COPD, former smoker,    Pulmonary exam normal        Cardiovascular Normal cardiovascular exam     Neuro/Psych    GI/Hepatic GERD  ,  Endo/Other    Renal/GU      Musculoskeletal   Abdominal   Peds  Hematology   Anesthesia Other Findings   Reproductive/Obstetrics                             Anesthesia Physical  Anesthesia Plan  ASA: II  Anesthesia Plan: MAC   Post-op Pain Management:    Induction:   Airway Management Planned:   Additional Equipment:   Intra-op Plan:   Post-operative Plan:   Informed Consent: I have reviewed the patients History and Physical, chart, labs and discussed the procedure including the risks, benefits and alternatives for the proposed anesthesia with the patient or authorized representative who has indicated his/her understanding and acceptance.     Plan Discussed with:   Anesthesia Plan Comments:         Anesthesia Quick Evaluation

## 2016-02-27 NOTE — Anesthesia Postprocedure Evaluation (Signed)
Anesthesia Post Note  Patient: Rayen L Kinlaw  Procedure(s) Performed: Procedure(s) (LRB): CATARACT EXTRACTION PHACO AND INTRAOCULAR LENS PLACEMENT (IOC) (Right)  Patient location during evaluation: PACU Anesthesia Type: MAC Level of consciousness: awake and alert and oriented Pain management: satisfactory to patient Vital Signs Assessment: post-procedure vital signs reviewed and stable Respiratory status: spontaneous breathing, nonlabored ventilation and respiratory function stable Cardiovascular status: blood pressure returned to baseline and stable Postop Assessment: Adequate PO intake and No signs of nausea or vomiting Anesthetic complications: no    Raliegh Ip

## 2016-02-27 NOTE — Discharge Instructions (Signed)
Cataract Surgery, Care After °Refer to this sheet in the next few weeks. These instructions provide you with information about caring for yourself after your procedure. Your health care provider may also give you more specific instructions. Your treatment has been planned according to current medical practices, but problems sometimes occur. Call your health care provider if you have any problems or questions after your procedure. °What can I expect after the procedure? °After the procedure, it is common to have: °· Itching. °· Discomfort. °· Fluid discharge. °· Sensitivity to light and to touch. °· Bruising. °Follow these instructions at home: °Eye Care  °· Check your eye every day for signs of infection. Watch for: °¨ Redness, swelling, or pain. °¨ Fluid, blood, or pus. °¨ Warmth. °¨ Bad smell. °Activity  °· Avoid strenuous activities, such as playing contact sports, for as long as told by your health care provider. °· Do not drive or operate heavy machinery until your health care provider approves. °· Do not bend or lift heavy objects . Bending increases pressure in the eye. You can walk, climb stairs, and do light household chores. °· Ask your health care provider when you can return to work. If you work in a dusty environment, you may be advised to wear protective eyewear for a period of time. °General instructions  °· Take or apply over-the-counter and prescription medicines only as told by your health care provider. This includes eye drops. °· Do not touch or rub your eyes. °· If you were given a protective shield, wear it as told by your health care provider. If you were not given a protective shield, wear sunglasses as told by your health care provider to protect your eyes. °· Keep the area around your eye clean and dry. Avoid swimming or allowing water to hit you directly in the face while showering until told by your health care provider. Keep soap and shampoo out of your eyes. °· Do not put a contact lens  into the affected eye or eyes until your health care provider approves. °· Keep all follow-up visits as told by your health care provider. This is important. °Contact a health care provider if: ° °· You have increased bruising around your eye. °· You have pain that is not helped with medicine. °· You have a fever. °· You have redness, swelling, or pain in your eye. °· You have fluid, blood, or pus coming from your incision. °· Your vision gets worse. °Get help right away if: °· You have sudden vision loss. °This information is not intended to replace advice given to you by your health care provider. Make sure you discuss any questions you have with your health care provider. °Document Released: 09/13/2004 Document Revised: 07/05/2015 Document Reviewed: 01/04/2015 °Elsevier Interactive Patient Education © 2017 Elsevier Inc[employer] ° ° ° ° °General Anesthesia, Adult, Care After °These instructions provide you with information about caring for yourself after your procedure. Your health care provider may also give you more specific instructions. Your treatment has been planned according to current medical practices, but problems sometimes occur. Call your health care provider if you have any problems or questions after your procedure. °What can I expect after the procedure? °After the procedure, it is common to have: °· Vomiting. °· A sore throat. °· Mental slowness. °It is common to feel: °· Nauseous. °· Cold or shivery. °· Sleepy. °· Tired. °· Sore or achy, even in parts of your body where you did not have surgery. °Follow these instructions at   home: °For at least 24 hours after the procedure:  °· Do not: °¨ Participate in activities where you could fall or become injured. °¨ Drive. °¨ Use heavy machinery. °¨ Drink alcohol. °¨ Take sleeping pills or medicines that cause drowsiness. °¨ Make important decisions or sign legal documents. °¨ Take care of children on your own. °· Rest. °Eating and drinking  °· If you vomit, drink  water, juice, or soup when you can drink without vomiting. °· Drink enough fluid to keep your urine clear or pale yellow. °· Make sure you have little or no nausea before eating solid foods. °· Follow the diet recommended by your health care provider. °General instructions  °· Have a responsible adult stay with you until you are awake and alert. °· Return to your normal activities as told by your health care provider. Ask your health care provider what activities are safe for you. °· Take over-the-counter and prescription medicines only as told by your health care provider. °· If you smoke, do not smoke without supervision. °· Keep all follow-up visits as told by your health care provider. This is important. °Contact a health care provider if: °· You continue to have nausea or vomiting at home, and medicines are not helpful. °· You cannot drink fluids or start eating again. °· You cannot urinate after 8-12 hours. °· You develop a skin rash. °· You have fever. °· You have increasing redness at the site of your procedure. °Get help right away if: °· You have difficulty breathing. °· You have chest pain. °· You have unexpected bleeding. °· You feel that you are having a life-threatening or urgent problem. °This information is not intended to replace advice given to you by your health care provider. Make sure you discuss any questions you have with your health care provider. °Document Released: 06/02/2000 Document Revised: 07/30/2015 Document Reviewed: 02/08/2015 °Elsevier Interactive Patient Education © 2017 Elsevier Inc. ° °

## 2016-02-27 NOTE — Transfer of Care (Signed)
Immediate Anesthesia Transfer of Care Note  Patient: Jeanette Harris  Procedure(s) Performed: Procedure(s) with comments: CATARACT EXTRACTION PHACO AND INTRAOCULAR LENS PLACEMENT (IOC) (Right) - TORIC Latex sensitivity  Patient Location: PACU  Anesthesia Type: MAC  Level of Consciousness: awake, alert  and patient cooperative  Airway and Oxygen Therapy: Patient Spontanous Breathing and Patient connected to supplemental oxygen  Post-op Assessment: Post-op Vital signs reviewed, Patient's Cardiovascular Status Stable, Respiratory Function Stable, Patent Airway and No signs of Nausea or vomiting  Post-op Vital Signs: Reviewed and stable  Complications: No apparent anesthesia complications

## 2016-02-27 NOTE — Anesthesia Procedure Notes (Signed)
Procedure Name: MAC Date/Time: 02/27/2016 11:50 AM Performed by: Janna Arch Pre-anesthesia Checklist: Patient identified, Emergency Drugs available, Suction available and Patient being monitored Patient Re-evaluated:Patient Re-evaluated prior to inductionOxygen Delivery Method: Nasal cannula

## 2016-02-27 NOTE — H&P (Signed)
The History and Physical notes are on paper, have been signed, and are to be scanned. The patient remains stable and unchanged from the H&P.   Previous H&P reviewed, patient examined, and there are no changes.  Jeanette Harris 02/27/2016 11:00 AM

## 2016-02-27 NOTE — Op Note (Signed)
LOCATION:  [place]   PREOPERATIVE DIAGNOSIS:  Nuclear sclerotic cataract of the right eye.  H25.11   POSTOPERATIVE DIAGNOSIS:  Nuclear sclerotic cataract of the right eye.   PROCEDURE:  Phacoemulsification with Toric posterior chamber intraocular lens placement of the right eye.   LENS:   Implant Name Type Inv. Item Serial No. Manufacturer Lot No. LRB No. Used  AcrySof IQ  IOL  Intraocular Lens   [id number] ALCON   Right 1     [id number] 20.5 D Toric intraocular lens with 3.0 diopters of cylindrical power with axis orientation at 92 degrees.   ULTRASOUND TIME: 16 % of 0 minutes, 53 seconds.  CDE 8.4   SURGEON:  Wyonia Hough, MD   ANESTHESIA: Topical with tetracaine drops and 2% Xylocaine jelly, augmented with 1% preservative-free intracameral lidocaine. .   COMPLICATIONS:  None.   DESCRIPTION OF PROCEDURE:  The patient was identified in the holding room and transported to the operating suite and placed in the supine position under the operating microscope.  The right eye was identified as the operative eye, and it was prepped and draped in the usual sterile ophthalmic fashion.    A clear-corneal paracentesis incision was made at the 12:00 position.  0.5 ml of preservative-free 1% lidocaine was injected into the anterior chamber. The anterior chamber was filled with Viscoat.  A 2.4 millimeter near clear corneal incision was then made at the 9:00 position.  A cystotome and capsulorrhexis forceps were then used to make a curvilinear capsulorrhexis.  Hydrodissection and hydrodelineation were then performed using balanced salt solution.   Phacoemulsification was then used in stop and chop fashion to remove the lens, nucleus and epinucleus.  The remaining cortex was aspirated using the irrigation and aspiration handpiece.  Provisc viscoelastic was then placed into the capsular bag to distend it for lens placement.  The Verion digital marker was used to align the implant at  the intended axis.   A Toric lens was then injected into the capsular bag.  It was rotated clockwise until the axis marks on the lens were approximately 15 degrees in the counterclockwise direction to the intended alignment.  The viscoelastic was aspirated from the eye using the irrigation aspiration handpiece.  Then, a Koch spatula through the sideport incision was used to rotate the lens in a clockwise direction until the axis markings of the intraocular lens were lined up with the Verion alignment.  Balanced salt solution was then used to hydrate the wounds. Cefuroxime 0.1 ml of a 10mg /ml solution was injected into the anterior chamber for a dose of 1 mg of intracameral antibiotic at the completion of the case.    The eye was noted to have a physiologic pressure and there was no wound leak noted.   Timolol and Brimonidine drops were applied to the eye.  The patient was taken to the recovery room in stable condition having had no complications of anesthesia or surgery.  Amelio Brosky 02/27/2016, 12:09 PM

## 2016-02-28 ENCOUNTER — Encounter: Payer: Self-pay | Admitting: Ophthalmology

## 2016-06-12 DIAGNOSIS — E782 Mixed hyperlipidemia: Secondary | ICD-10-CM | POA: Insufficient documentation

## 2016-12-10 ENCOUNTER — Other Ambulatory Visit: Payer: Self-pay | Admitting: Physician Assistant

## 2016-12-10 DIAGNOSIS — Z1231 Encounter for screening mammogram for malignant neoplasm of breast: Secondary | ICD-10-CM

## 2016-12-22 ENCOUNTER — Ambulatory Visit
Admission: RE | Admit: 2016-12-22 | Discharge: 2016-12-22 | Disposition: A | Discharge status: Home / Self Care | Payer: Medicare Other | Source: Ambulatory Visit | Attending: Physician Assistant | Admitting: Physician Assistant

## 2016-12-22 DIAGNOSIS — Z1231 Encounter for screening mammogram for malignant neoplasm of breast: Secondary | ICD-10-CM

## 2017-01-02 ENCOUNTER — Other Ambulatory Visit: Payer: Self-pay | Admitting: Physician Assistant

## 2017-01-02 DIAGNOSIS — N6310 Unspecified lump in the right breast, unspecified quadrant: Secondary | ICD-10-CM

## 2017-01-02 DIAGNOSIS — N63 Unspecified lump in unspecified breast: Secondary | ICD-10-CM

## 2017-01-02 DIAGNOSIS — N6320 Unspecified lump in the left breast, unspecified quadrant: Secondary | ICD-10-CM

## 2017-01-05 ENCOUNTER — Other Ambulatory Visit: Payer: Self-pay | Admitting: Physician Assistant

## 2017-01-05 DIAGNOSIS — N6459 Other signs and symptoms in breast: Secondary | ICD-10-CM

## 2017-01-07 ENCOUNTER — Ambulatory Visit
Admission: RE | Admit: 2017-01-07 | Discharge: 2017-01-07 | Disposition: A | Discharge status: Home / Self Care | Payer: Medicare Other | Source: Ambulatory Visit | Attending: Physician Assistant | Admitting: Physician Assistant

## 2017-01-07 ENCOUNTER — Other Ambulatory Visit: Payer: Self-pay | Admitting: Physician Assistant

## 2017-01-07 DIAGNOSIS — Z1231 Encounter for screening mammogram for malignant neoplasm of breast: Secondary | ICD-10-CM

## 2017-01-07 DIAGNOSIS — N6459 Other signs and symptoms in breast: Secondary | ICD-10-CM

## 2017-02-06 ENCOUNTER — Other Ambulatory Visit: Payer: Self-pay | Admitting: Physician Assistant

## 2017-02-06 DIAGNOSIS — R198 Other specified symptoms and signs involving the digestive system and abdomen: Secondary | ICD-10-CM

## 2017-02-11 ENCOUNTER — Ambulatory Visit
Admission: RE | Admit: 2017-02-11 | Discharge: 2017-02-11 | Disposition: A | Discharge status: Home / Self Care | Payer: Medicare Other | Source: Ambulatory Visit | Attending: Physician Assistant | Admitting: Physician Assistant

## 2017-02-11 DIAGNOSIS — R198 Other specified symptoms and signs involving the digestive system and abdomen: Secondary | ICD-10-CM | POA: Insufficient documentation

## 2017-02-28 ENCOUNTER — Emergency Department: Payer: Medicare Other

## 2017-02-28 ENCOUNTER — Encounter: Payer: Self-pay | Admitting: Emergency Medicine

## 2017-02-28 ENCOUNTER — Emergency Department
Admission: EM | Admit: 2017-02-28 | Discharge: 2017-02-28 | Disposition: A | Discharge status: Home / Self Care | Payer: Medicare Other | Attending: Emergency Medicine | Admitting: Emergency Medicine

## 2017-02-28 ENCOUNTER — Other Ambulatory Visit: Payer: Self-pay

## 2017-02-28 DIAGNOSIS — Z87891 Personal history of nicotine dependence: Secondary | ICD-10-CM | POA: Insufficient documentation

## 2017-02-28 DIAGNOSIS — J449 Chronic obstructive pulmonary disease, unspecified: Secondary | ICD-10-CM | POA: Diagnosis not present

## 2017-02-28 DIAGNOSIS — Y998 Other external cause status: Secondary | ICD-10-CM | POA: Insufficient documentation

## 2017-02-28 DIAGNOSIS — Y9301 Activity, walking, marching and hiking: Secondary | ICD-10-CM | POA: Insufficient documentation

## 2017-02-28 DIAGNOSIS — Y929 Unspecified place or not applicable: Secondary | ICD-10-CM | POA: Diagnosis not present

## 2017-02-28 DIAGNOSIS — Z91040 Latex allergy status: Secondary | ICD-10-CM | POA: Diagnosis not present

## 2017-02-28 DIAGNOSIS — Z79899 Other long term (current) drug therapy: Secondary | ICD-10-CM | POA: Insufficient documentation

## 2017-02-28 DIAGNOSIS — W19XXXA Unspecified fall, initial encounter: Secondary | ICD-10-CM | POA: Diagnosis not present

## 2017-02-28 DIAGNOSIS — S79912A Unspecified injury of left hip, initial encounter: Secondary | ICD-10-CM | POA: Diagnosis present

## 2017-02-28 DIAGNOSIS — S83512A Sprain of anterior cruciate ligament of left knee, initial encounter: Secondary | ICD-10-CM | POA: Diagnosis not present

## 2017-02-28 MED ORDER — OXYCODONE HCL 5 MG PO TABS
5.0000 mg | ORAL_TABLET | Freq: Once | ORAL | Status: AC
  Administered 2017-02-28: 5 mg via ORAL
  Filled 2017-02-28: qty 1

## 2017-02-28 MED ORDER — OXYCODONE-ACETAMINOPHEN 5-325 MG PO TABS: 1.0000 | ORAL_TABLET | ORAL | 0 refills | Status: DC | PRN

## 2017-02-28 NOTE — ED Triage Notes (Signed)
L leg edema and pain since yesterday. Fell 3 weeks ago, no other injury. Does not appear swollen to me but states feels swollen and unable to bend well.

## 2017-02-28 NOTE — ED Provider Notes (Signed)
[facility] Emergency Department Provider Note ____________________________________________  Time seen: Approximately 2:38 PM  I have reviewed the triage vital signs and the nursing notes.   HISTORY  Chief Complaint Leg Pain    HPI Jeanette Harris is a 71 y.o. female who presents to the emergency department for evaluation of left leg pain. She states that she fell 3 weeks ago due to feeling that her "leg gave out." She landed on her left hip/leg and has had pain since that time. Pain is worse with ambulation. She feels that "something is definitely wrong." She went to [facility] but was sent here to rule out DVT.  Past Medical History:  Diagnosis Date  . Arthritis   . Complication of anesthesia   . COPD (chronic obstructive pulmonary disease) ([place])    patient denies  . DVT (deep venous thrombosis) ([place])   . GERD (gastroesophageal reflux disease)   . Headache   . Heart murmur   . Peripheral vascular disease (HCC)    phlebitis  . Phlebitis   . PONV (postoperative nausea and vomiting)     There are no active problems to display for this patient.   Past Surgical History:  Procedure Laterality Date  . ABDOMINAL HYSTERECTOMY    . ANKLE SURGERY    . APPENDECTOMY    . BACK SURGERY    . CATARACT EXTRACTION W/PHACO Left 02/13/2016   Procedure: CATARACT EXTRACTION PHACO AND INTRAOCULAR LENS PLACEMENT (IOC);  Surgeon: Leandrew Koyanagi, MD;  Location: [place];  Service: Ophthalmology;  Laterality: Left;  TORIC  . CATARACT EXTRACTION W/PHACO Right 02/27/2016   Procedure: CATARACT EXTRACTION PHACO AND INTRAOCULAR LENS PLACEMENT (IOC);  Surgeon: Leandrew Koyanagi, MD;  Location: [place];  Service: Ophthalmology;  Laterality: Right;  TORIC Latex sensitivity  . CHOLECYSTECTOMY    . COLONOSCOPY    . ESOPHAGOGASTRODUODENOSCOPY    . FOOT SURGERY    . SHOULDER ARTHROSCOPY      Prior to Admission medications   Medication Sig  Start Date End Date Taking? Authorizing Provider  acetaminophen (TYLENOL) 500 MG tablet Take 500 mg by mouth every 6 (six) hours as needed.    [provider]  calcium-vitamin D (OSCAL WITH D) 500-200 MG-UNIT tablet Take 1 tablet by mouth.    [provider]  Cyanocobalamin (B-12 COMPLIANCE INJECTION IJ) Inject as directed.    [provider]  docusate sodium (COLACE) 250 MG capsule Take 250 mg by mouth daily.    [provider]  lansoprazole (PREVACID) 30 MG capsule Take 30 mg by mouth daily at 12 noon.    [provider]  Multiple Vitamin (MULTIVITAMIN) capsule Take 1 capsule by mouth daily.    [provider]  nortriptyline (PAMELOR) 75 MG capsule Take 75 mg by mouth at bedtime.    [provider]  oxyCODONE-acetaminophen (ROXICET) 5-325 MG tablet Take 1 tablet by mouth every 4 (four) hours as needed for severe pain. 02/28/17   Natina Wiginton B, FNP  rOPINIRole (REQUIP) 0.5 MG tablet Take 0.5 mg by mouth at bedtime.    [provider]  sucralfate (CARAFATE) 1 g tablet Take 1 g by mouth 4 (four) times daily -  with meals and at bedtime.    [provider]  topiramate (TOPAMAX) 50 MG tablet Take 50 mg by mouth 2 (two) times daily.    [provider]    Allergies Aspirin; Ivp dye [iodinated diagnostic agents]; Nsaids; and Latex  Family History  Problem Relation Age of Onset  . Breast cancer Maternal Aunt     Social History Social History   Tobacco Use  . Smoking status: Former Research scientist (life sciences)  . Smokeless tobacco: Never Used  Substance Use Topics  . Alcohol use: No  . Drug use: No    Review of Systems Constitutional: Positive for recent fall. Cardiovascular: Negative for chest pain. Respiratory: Negative for shortness of breath. Musculoskeletal: Positive for left leg pain. Skin: Negative for open wounds or lesions.  Neurological: Negative for  paresthesias.  ____________________________________________   PHYSICAL EXAM:  VITAL SIGNS: ED Triage Vitals  Enc Vitals Group     BP 02/28/17 1241 (!) 120/54     Pulse Rate 02/28/17 1241 88     Resp 02/28/17 1241 20     Temp 02/28/17 1241 98.3 F (36.8 C)     Temp Source 02/28/17 1241 Oral     SpO2 02/28/17 1241 98 %     Weight 02/28/17 1243 178 lb (80.7 kg)     Height 02/28/17 1243 5' 5.5" (1.664 m)     Head Circumference --      Peak Flow --      Pain Score 02/28/17 1241 9     Pain Loc --      Pain Edu? --      Excl. in [place]? --     Constitutional: Alert and oriented. Well appearing and in no acute distress. Eyes: Conjunctivae are clear without discharge or drainage Head: Atraumatic Neck: Full, active ROM observed. Respiratory: Respirations even and unlabored. Musculoskeletal: Tenderness with palpation over the lateral hip. No bony tenderness over the left femur. No obvious deformity of the patella. Patella tracks midline. Tender with palpation in the popliteal area. Mild swelling diffuse over the left knee. No bony tenderness over the tibia or fibula or ankle. Lachman's Test is positive for stimulation of pain. Neurologic: Awake, alert, and oriented. Sharp and dull sensation intact over the left lower extremity. Skin:  Chronic skin changes over the left lower extremity. No obvious wounds or lesions. No erythema noted.  Psychiatric: Affect and behavior appropriate.   ____________________________________________   LABS (all labs ordered are listed, but only abnormal results are displayed)  Labs Reviewed - No data to display ____________________________________________  RADIOLOGY  US of the left lower extremity is negative for DVT per radiology.  X-ray of the left femur and hip is negative for acute bony abnormality. ____________________________________________   PROCEDURES  .Splint Application Date/Time: 97/98/9211 3:59 PM Performed by: Victorino Dike,  FNP Authorized by: Victorino Dike, FNP   Consent:    Consent obtained:  Verbal   Consent given by:  Patient Pre-procedure details:    Sensation:  Normal Procedure details:    Laterality:  Left   Location:  Leg   Splint type:  Knee immobilizer   Supplies:  Prefabricated splint Post-procedure details:    Pain:  Unchanged   Sensation:  Normal   Patient tolerance of procedure:  Tolerated well, no immediate complications    ____________________________________________   INITIAL IMPRESSION / ASSESSMENT AND PLAN / ED COURSE  Jeanette Harris is a 71 y.o. female who presents to the emergency department for evaluation and treatment of left leg pain. She was placed in a knee immobilizer and encouraged to call and schedule an appointment with her orthopedist. She was advised to return to the ER for symptoms that change or worsen if unable to see her PCP or specialist.  Medications  oxyCODONE (Oxy  IR/ROXICODONE) immediate release tablet 5 mg (5 mg Oral Given 02/28/17 1452)    Pertinent labs & imaging results that were available during my care of the patient were reviewed by me and considered in my medical decision making (see chart for details).  _________________________________________   FINAL CLINICAL IMPRESSION(S) / ED DIAGNOSES  Final diagnoses:  Sprain of anterior cruciate ligament of left knee, initial encounter    ED Discharge Orders        Ordered    oxyCODONE-acetaminophen (ROXICET) 5-325 MG tablet  Every 4 hours PRN     02/28/17 1534       If controlled substance prescribed during this visit, 12 month history viewed on the [place] prior to issuing an initial prescription for Schedule II or III opiod.    Victorino Dike, FNP 02/28/17 1601    Nena Polio, MD 03/01/17 [phone]

## 2017-02-28 NOTE — ED Notes (Signed)
[place] neg for DVT, to flex wait.

## 2017-03-20 ENCOUNTER — Other Ambulatory Visit: Payer: Self-pay | Admitting: Physician Assistant

## 2017-03-20 DIAGNOSIS — M2392 Unspecified internal derangement of left knee: Secondary | ICD-10-CM

## 2017-03-26 ENCOUNTER — Ambulatory Visit
Admission: RE | Admit: 2017-03-26 | Discharge: 2017-03-26 | Disposition: A | Discharge status: Home / Self Care | Payer: Medicare Other | Source: Ambulatory Visit | Attending: Physician Assistant | Admitting: Physician Assistant

## 2017-03-26 DIAGNOSIS — X58XXXA Exposure to other specified factors, initial encounter: Secondary | ICD-10-CM | POA: Diagnosis not present

## 2017-03-26 DIAGNOSIS — S83282A Other tear of lateral meniscus, current injury, left knee, initial encounter: Secondary | ICD-10-CM | POA: Insufficient documentation

## 2017-03-26 DIAGNOSIS — M2392 Unspecified internal derangement of left knee: Secondary | ICD-10-CM

## 2017-03-26 DIAGNOSIS — S83242A Other tear of medial meniscus, current injury, left knee, initial encounter: Secondary | ICD-10-CM | POA: Diagnosis not present

## 2017-04-01 ENCOUNTER — Other Ambulatory Visit: Payer: Self-pay | Admitting: Orthopedic Surgery

## 2017-04-01 ENCOUNTER — Ambulatory Visit
Admission: RE | Admit: 2017-04-01 | Discharge: 2017-04-01 | Disposition: A | Discharge status: Home / Self Care | Payer: Medicare Other | Source: Ambulatory Visit | Attending: Orthopedic Surgery | Admitting: Orthopedic Surgery

## 2017-04-01 DIAGNOSIS — I82509 Chronic embolism and thrombosis of unspecified deep veins of unspecified lower extremity: Secondary | ICD-10-CM | POA: Insufficient documentation

## 2017-04-01 DIAGNOSIS — I82402 Acute embolism and thrombosis of unspecified deep veins of left lower extremity: Secondary | ICD-10-CM | POA: Insufficient documentation

## 2017-04-01 DIAGNOSIS — I82409 Acute embolism and thrombosis of unspecified deep veins of unspecified lower extremity: Secondary | ICD-10-CM

## 2017-04-01 DIAGNOSIS — I824Y2 Acute embolism and thrombosis of unspecified deep veins of left proximal lower extremity: Secondary | ICD-10-CM | POA: Insufficient documentation

## 2017-04-01 HISTORY — DX: Acute embolism and thrombosis of unspecified deep veins of unspecified lower extremity: I82.409

## 2017-05-13 ENCOUNTER — Other Ambulatory Visit: Payer: Self-pay | Admitting: Physician Assistant

## 2017-05-13 ENCOUNTER — Ambulatory Visit
Admission: RE | Admit: 2017-05-13 | Discharge: 2017-05-13 | Disposition: A | Discharge status: Home / Self Care | Payer: Medicare Other | Source: Ambulatory Visit | Attending: Physician Assistant | Admitting: Physician Assistant

## 2017-05-13 DIAGNOSIS — M79605 Pain in left leg: Secondary | ICD-10-CM | POA: Diagnosis not present

## 2017-05-13 DIAGNOSIS — Z86718 Personal history of other venous thrombosis and embolism: Secondary | ICD-10-CM | POA: Diagnosis not present

## 2017-05-13 DIAGNOSIS — M79604 Pain in right leg: Secondary | ICD-10-CM | POA: Insufficient documentation

## 2017-06-01 ENCOUNTER — Encounter (INDEPENDENT_AMBULATORY_CARE_PROVIDER_SITE_OTHER): Payer: Self-pay | Admitting: Vascular Surgery

## 2017-06-01 ENCOUNTER — Ambulatory Visit (INDEPENDENT_AMBULATORY_CARE_PROVIDER_SITE_OTHER): Payer: Medicare Other | Admitting: Vascular Surgery

## 2017-06-01 VITALS — BP 129/74 | HR 85 | Resp 16 | Ht 66.0 in | Wt 180.0 lb

## 2017-06-01 DIAGNOSIS — M5137 Other intervertebral disc degeneration, lumbosacral region: Secondary | ICD-10-CM

## 2017-06-01 DIAGNOSIS — I82402 Acute embolism and thrombosis of unspecified deep veins of left lower extremity: Secondary | ICD-10-CM | POA: Diagnosis not present

## 2017-06-01 DIAGNOSIS — E70319 Ocular albinism, unspecified: Secondary | ICD-10-CM

## 2017-06-01 DIAGNOSIS — Z86718 Personal history of other venous thrombosis and embolism: Secondary | ICD-10-CM | POA: Insufficient documentation

## 2017-06-01 DIAGNOSIS — I872 Venous insufficiency (chronic) (peripheral): Secondary | ICD-10-CM | POA: Insufficient documentation

## 2017-06-01 DIAGNOSIS — M51379 Other intervertebral disc degeneration, lumbosacral region without mention of lumbar back pain or lower extremity pain: Secondary | ICD-10-CM | POA: Insufficient documentation

## 2017-06-01 NOTE — Progress Notes (Signed)
Subjective:    Patient ID: Jeanette Harris, female    DOB: Jun 18, 1945, 72 y.o.   MRN: [id number] Chief Complaint  Patient presents with  . New Patient (Initial Visit)    bilateral leg pain   Presents as a new patient referred by PA [facility] for evaluation of left lower extremity DVT and bilateral lower extremity pain.  Seen with friend.  The patient endorses a long-standing history of bilateral lower extremity edema and pain.  The patient notes that she was told she has "brain problems" in the "deep veins" and she was told that there was "nothing they can do".  The patient is scheduled to undergo a left knee replacement with Dr. Posey Pronto in the near future.  Patient notes an acute worsening in pain to her left lower extremity and December/January.  The patient states that she was originally diagnosed with a DVT to the left lower extremity.  The patient underwent an ultrasound on February 28, 2017 which was notable for no lower extremity DVT.  On April 01, 2017 the patient was found to have "POSITIVE for occlusive DVT in left calf veins, popliteal and femoral veins".  These 2 ultrasounds were completed at [facility].  The patient underwent another ultrasound on May 13, 2017 at the [facility] outpatient imaging center which was notable for no DVT.  Patient presents today complaining of discoloration to the bilateral feet especially her left foot which has been present for many years.  Patient notes a bilateral pain to the back of her thighs with and without ambulation.  Patient notes 2 back surgeries.  The patient notes swelling to the bilateral lower extremity.  The patient has become very frustrated with her symptoms and feels that they are lifestyle limiting.  The patient denies any rest pain or ulceration to the bilateral lower extremity.  The patient denies any fever, nausea or vomiting.  Review of Systems  Constitutional: Negative.   HENT: Negative.   Eyes: Negative.     Respiratory: Negative.   Cardiovascular: Positive for leg swelling.       LLE DVT Venous Reflux  Gastrointestinal: Negative.   Endocrine: Negative.   Genitourinary: Negative.   Musculoskeletal: Negative.   Skin: Negative.   Allergic/Immunologic: Negative.   Neurological: Negative.   Hematological: Negative.   Psychiatric/Behavioral: Negative.       Objective:   Physical Exam  Constitutional: She is oriented to person, place, and time. She appears well-developed and well-nourished. No distress.  HENT:  Head: Normocephalic and atraumatic.  Eyes: Pupils are equal, round, and reactive to light. Conjunctivae are normal.  Neck: Normal range of motion.  Cardiovascular: Normal rate, regular rhythm, normal heart sounds and intact distal pulses.  Pulses:      Radial pulses are 2+ on the right side, and 2+ on the left side.  Hard to palpate pedal pulses  Pulmonary/Chest: Effort normal and breath sounds normal.  Musculoskeletal: Normal range of motion. She exhibits edema (Mild nonpitting edema noted bilaterally).  Neurological: She is alert and oriented to person, place, and time.  Skin: She is not diaphoretic.  Severe stasis dermatitis to the bilateral feet.  No skin changes.  There is no cellulitis.  There are less than 1 cm varicosities noted to the bilateral lower extremity.  Psychiatric: She has a normal mood and affect. Her behavior is normal. Judgment and thought content normal.  Vitals reviewed.  BP 129/74   Pulse 85   Resp 16   Ht 5'  6" (1.676 m)   Wt 180 lb (81.6 kg)   BMI 29.05 kg/m   Past Medical History:  Diagnosis Date  . Arthritis   . Complication of anesthesia   . COPD (chronic obstructive pulmonary disease) ([place])    patient denies  . DVT (deep venous thrombosis) ([place])   . GERD (gastroesophageal reflux disease)   . Headache   . Heart murmur   . Peripheral vascular disease (HCC)    phlebitis  . Phlebitis   . PONV (postoperative nausea and vomiting)     Social History   Socioeconomic History  . Marital status: Married    Spouse name: Not on file  . Number of children: Not on file  . Years of education: Not on file  . Highest education level: Not on file  Occupational History  . Not on file  Social Needs  . Financial resource strain: Not on file  . Food insecurity:    Worry: Not on file    Inability: Not on file  . Transportation needs:    Medical: Not on file    Non-medical: Not on file  Tobacco Use  . Smoking status: Former Research scientist (life sciences)  . Smokeless tobacco: Never Used  Substance and Sexual Activity  . Alcohol use: No  . Drug use: No  . Sexual activity: Not on file  Lifestyle  . Physical activity:    Days per week: Not on file    Minutes per session: Not on file  . Stress: Not on file  Relationships  . Social connections:    Talks on phone: Not on file    Gets together: Not on file    Attends religious service: Not on file    Active member of club or organization: Not on file    Attends meetings of clubs or organizations: Not on file    Relationship status: Not on file  . Intimate partner violence:    Fear of current or ex partner: Not on file    Emotionally abused: Not on file    Physically abused: Not on file    Forced sexual activity: Not on file  Other Topics Concern  . Not on file  Social History Narrative  . Not on file   Past Surgical History:  Procedure Laterality Date  . ABDOMINAL HYSTERECTOMY    . ANKLE SURGERY    . APPENDECTOMY    . BACK SURGERY    . CATARACT EXTRACTION W/PHACO Left 02/13/2016   Procedure: CATARACT EXTRACTION PHACO AND INTRAOCULAR LENS PLACEMENT (IOC);  Surgeon: Leandrew Koyanagi, MD;  Location: [place];  Service: Ophthalmology;  Laterality: Left;  TORIC  . CATARACT EXTRACTION W/PHACO Right 02/27/2016   Procedure: CATARACT EXTRACTION PHACO AND INTRAOCULAR LENS PLACEMENT (IOC);  Surgeon: Leandrew Koyanagi, MD;  Location: [place];  Service: Ophthalmology;   Laterality: Right;  TORIC Latex sensitivity  . CHOLECYSTECTOMY    . COLONOSCOPY    . ESOPHAGOGASTRODUODENOSCOPY    . FOOT SURGERY    . SHOULDER ARTHROSCOPY     Family History  Problem Relation Age of Onset  . Breast cancer Maternal Aunt    Allergies  Allergen Reactions  . Aspirin Other (See Comments)    unknown  . Ivp Dye [Iodinated Diagnostic Agents] Nausea And Vomiting  . Nsaids Other (See Comments)    Unknown  . Latex Rash      Assessment & Plan:  Presents as a new patient referred by PA [facility] for evaluation of left lower extremity DVT  and bilateral lower extremity pain.  Seen with friend.  The patient endorses a long-standing history of bilateral lower extremity edema and pain.  The patient notes that she was told she has "brain problems" in the "deep veins" and she was told that there was "nothing they can do".  The patient is scheduled to undergo a left knee replacement with Dr. Posey Pronto in the near future.  Patient notes an acute worsening in pain to her left lower extremity and December/January.  The patient states that she was originally diagnosed with a DVT to the left lower extremity.  The patient underwent an ultrasound on February 28, 2017 which was notable for no lower extremity DVT.  On April 01, 2017 the patient was found to have "POSITIVE for occlusive DVT in left calf veins, popliteal and femoral veins".  These 2 ultrasounds were completed at [facility].  The patient underwent another ultrasound on May 13, 2017 at the [facility] outpatient imaging center which was notable for no DVT.  Patient presents today complaining of discoloration to the bilateral feet especially her left foot which has been present for many years.  Patient notes a bilateral pain to the back of her thighs with and without ambulation.  Patient notes 2 back surgeries.  The patient notes swelling to the bilateral lower extremity.  The patient has become very frustrated with her  symptoms and feels that they are lifestyle limiting.  The patient denies any rest pain or ulceration to the bilateral lower extremity.  The patient denies any fever, nausea or vomiting.  1. Deep vein thrombosis (DVT) of left lower extremity, unspecified chronicity, unspecified vein (HCC) - New I will bring the patient back to undergo a bilateral DVT / Reflux study Patient is certain that she has a left lower extremity DVT even though the recent DVT study noted that there is no DVT or SVT seen.  And is upset that our office does not complete for the ultrasounds of the legs. I will also bring the patient back to undergo a venous reflux study as her physical exam does seem that she has some type of venous reflux.  The patient notes that she was told in the past she has venous reflux of the deep system however has just recently started wearing compression socks. I will also bring the patient back to undergo an ABI to assess for any contributing peripheral artery disease due to the patient's discomfort which I do feel is osteoarthritis or degenerative disc disease from her lumbosacral disease of her spine. In the meantime, the patient should continue wearing compression socks and elevating her legs on a daily basis  - VAS [place] ABI WITH/WO TBI; Future - VAS [place] LOWER EXTREMITY VENOUS REFLUX; Future  2. Venous reflux - New As above  3. OA (ocular albinism) (HCC) - Stable This is most likely a contributing factor to the patient's lower extremity pain however I will order an ABI to rule out any contributing peripheral artery disease  4. DDD (degenerative disc disease), lumbosacral - Stable This is most likely a contributing factor to the patient's lower extremity pain however I will order an ABI to rule out any contributing peripheral artery disease  Current Outpatient Medications on File Prior to Visit  Medication Sig Dispense Refill  . acetaminophen (TYLENOL) 500 MG tablet Take 500 mg by mouth every 6  (six) hours as needed.    . Cyanocobalamin (B-12 COMPLIANCE INJECTION IJ) Inject as directed.    . docusate  sodium (COLACE) 250 MG capsule Take 250 mg by mouth daily.    . lansoprazole (PREVACID) 30 MG capsule Take 30 mg by mouth daily at 12 noon.    . Multiple Vitamin (MULTIVITAMIN) capsule Take 1 capsule by mouth daily.    . nortriptyline (PAMELOR) 75 MG capsule Take 75 mg by mouth at bedtime.    . sucralfate (CARAFATE) 1 g tablet Take 1 g by mouth 4 (four) times daily -  with meals and at bedtime.    . topiramate (TOPAMAX) 50 MG tablet Take 50 mg by mouth 2 (two) times daily.    . calcium-vitamin D (OSCAL WITH D) 500-200 MG-UNIT tablet Take 1 tablet by mouth.    . oxyCODONE-acetaminophen (ROXICET) 5-325 MG tablet Take 1 tablet by mouth every 4 (four) hours as needed for severe pain. (Patient not taking: Reported on 06/01/2017) 20 tablet 0  . rOPINIRole (REQUIP) 0.5 MG tablet Take 0.5 mg by mouth at bedtime.     No current facility-administered medications on file prior to visit.    There are no Patient Instructions on file for this visit. No follow-ups on file.  Calysta Craigo A Mosella Kasa, PA-C

## 2017-06-18 ENCOUNTER — Other Ambulatory Visit (INDEPENDENT_AMBULATORY_CARE_PROVIDER_SITE_OTHER): Payer: Self-pay | Admitting: Vascular Surgery

## 2017-06-18 ENCOUNTER — Encounter (INDEPENDENT_AMBULATORY_CARE_PROVIDER_SITE_OTHER): Payer: Medicare Other

## 2017-06-18 ENCOUNTER — Ambulatory Visit (INDEPENDENT_AMBULATORY_CARE_PROVIDER_SITE_OTHER): Payer: Medicare Other | Admitting: Vascular Surgery

## 2017-06-18 ENCOUNTER — Encounter (INDEPENDENT_AMBULATORY_CARE_PROVIDER_SITE_OTHER): Payer: Self-pay | Admitting: Vascular Surgery

## 2017-06-18 ENCOUNTER — Ambulatory Visit (INDEPENDENT_AMBULATORY_CARE_PROVIDER_SITE_OTHER): Payer: Medicare Other

## 2017-06-18 VITALS — BP 123/67 | HR 78 | Resp 16 | Ht 66.0 in | Wt 181.0 lb

## 2017-06-18 DIAGNOSIS — I82402 Acute embolism and thrombosis of unspecified deep veins of left lower extremity: Secondary | ICD-10-CM

## 2017-06-18 DIAGNOSIS — M5137 Other intervertebral disc degeneration, lumbosacral region: Secondary | ICD-10-CM

## 2017-06-18 DIAGNOSIS — I872 Venous insufficiency (chronic) (peripheral): Secondary | ICD-10-CM | POA: Diagnosis not present

## 2017-06-18 DIAGNOSIS — M1712 Unilateral primary osteoarthritis, left knee: Secondary | ICD-10-CM

## 2017-06-21 ENCOUNTER — Encounter (INDEPENDENT_AMBULATORY_CARE_PROVIDER_SITE_OTHER): Payer: Self-pay | Admitting: Vascular Surgery

## 2017-06-21 DIAGNOSIS — M1990 Unspecified osteoarthritis, unspecified site: Secondary | ICD-10-CM | POA: Insufficient documentation

## 2017-06-21 NOTE — Progress Notes (Signed)
MRN : [id number]  Jeanette Harris is a 72 y.o. (26-Oct-1945) female who presents with chief complaint of  Chief Complaint  Patient presents with  . Follow-up  .  History of Present Illness: The patient presents to the office for evaluation of DVT.  DVT was identified several months ago in the left leg at [facility] by Duplex ultrasound.  The initial symptoms were pain and swelling in the lower extremity.  She has a torn left meniscus and is planning to have Dr. Posey Pronto repair this within the month.  Currently is she is taking Eliquis 5 mg twice daily.  The patient notes the leg continues to be very painful with dependency and swells quite a bite.  Symptoms are much better with elevation.  The patient notes minimal edema in the morning which steadily worsens throughout the day.    The patient has not been using compression therapy at this point.  No SOB or pleuritic chest pains.  No cough or hemoptysis.  No blood per rectum or blood in any sputum.  No excessive bruising per the patient.   Current Meds  Medication Sig  . acetaminophen (TYLENOL) 500 MG tablet Take 500 mg by mouth every 6 (six) hours as needed.  . calcium-vitamin D (OSCAL WITH D) 500-200 MG-UNIT tablet Take 1 tablet by mouth.  . Cyanocobalamin (B-12 COMPLIANCE INJECTION IJ) Inject as directed.  . docusate sodium (COLACE) 250 MG capsule Take 250 mg by mouth daily.  . lansoprazole (PREVACID) 30 MG capsule Take 30 mg by mouth daily at 12 noon.  . Multiple Vitamin (MULTIVITAMIN) capsule Take 1 capsule by mouth daily.  . nortriptyline (PAMELOR) 75 MG capsule Take 75 mg by mouth at bedtime.  . sucralfate (CARAFATE) 1 g tablet Take 1 g by mouth 4 (four) times daily -  with meals and at bedtime.  . topiramate (TOPAMAX) 50 MG tablet Take 50 mg by mouth 2 (two) times daily.    Past Medical History:  Diagnosis Date  . Arthritis   . Complication of anesthesia   . COPD (chronic obstructive pulmonary disease) ([place])    patient denies    . DVT (deep venous thrombosis) ([place])   . GERD (gastroesophageal reflux disease)   . Headache   . Heart murmur   . Peripheral vascular disease (HCC)    phlebitis  . Phlebitis   . PONV (postoperative nausea and vomiting)     Past Surgical History:  Procedure Laterality Date  . ABDOMINAL HYSTERECTOMY    . ANKLE SURGERY    . APPENDECTOMY    . BACK SURGERY    . CATARACT EXTRACTION W/PHACO Left 02/13/2016   Procedure: CATARACT EXTRACTION PHACO AND INTRAOCULAR LENS PLACEMENT (IOC);  Surgeon: Leandrew Koyanagi, MD;  Location: [place];  Service: Ophthalmology;  Laterality: Left;  TORIC  . CATARACT EXTRACTION W/PHACO Right 02/27/2016   Procedure: CATARACT EXTRACTION PHACO AND INTRAOCULAR LENS PLACEMENT (IOC);  Surgeon: Leandrew Koyanagi, MD;  Location: [place];  Service: Ophthalmology;  Laterality: Right;  TORIC Latex sensitivity  . CHOLECYSTECTOMY    . COLONOSCOPY    . ESOPHAGOGASTRODUODENOSCOPY    . FOOT SURGERY    . SHOULDER ARTHROSCOPY      Social History Social History   Tobacco Use  . Smoking status: Former Research scientist (life sciences)  . Smokeless tobacco: Never Used  Substance Use Topics  . Alcohol use: No  . Drug use: No    Family History Family History  Problem Relation Age of Onset  .  Breast cancer Maternal Aunt   No family history of bleeding/clotting disorders, porphyria or autoimmune disease   Allergies  Allergen Reactions  . Aspirin Other (See Comments)    unknown  . Ivp Dye [Iodinated Diagnostic Agents] Nausea And Vomiting  . Nsaids Other (See Comments)    Unknown  . Latex Rash     REVIEW OF SYSTEMS (Negative unless checked)  Constitutional: [] Weight loss  [] Fever  [] Chills Cardiac: [] Chest pain   [] Chest pressure   [] Palpitations   [] Shortness of breath when laying flat   [] Shortness of breath with exertion. Vascular:  [] Pain in legs with walking   [x] Pain in legs with standing  [x] History of DVT   [] Phlebitis   [x] Swelling in legs    [] Varicose veins   [] Non-healing ulcers Pulmonary:   [] Uses home oxygen   [] Productive cough   [] Hemoptysis   [] Wheeze  [] COPD   [] Asthma Neurologic:  [] Dizziness   [] Seizures   [] History of stroke   [] History of TIA  [] Aphasia   [] Vissual changes   [] Weakness or numbness in arm   [] Weakness or numbness in leg Musculoskeletal:   [x] Joint swelling   [x] Joint pain   [] Low back pain Hematologic:  [] Easy bruising  [] Easy bleeding   [] Hypercoagulable state   [] Anemic Gastrointestinal:  [] Diarrhea   [] Vomiting  [] Gastroesophageal reflux/heartburn   [] Difficulty swallowing. Genitourinary:  [] Chronic kidney disease   [] Difficult urination  [] Frequent urination   [] Blood in urine Skin:  [] Rashes   [] Ulcers  Psychological:  [] History of anxiety   []  History of major depression.  Physical Examination  Vitals:   06/18/17 1050  BP: 123/67  Pulse: 78  Resp: 16  Weight: 181 lb (82.1 kg)  Height: 5\' 6"  (1.676 m)   Body mass index is 29.21 kg/m. Gen: WD/WN, NAD Head: [place]/AT, No temporalis wasting.  Ear/Nose/Throat: Hearing grossly intact, nares w/o erythema or drainage, poor dentition Eyes: PER, EOMI, sclera nonicteric.  Neck: Supple, no masses.  No bruit or JVD.  Pulmonary:  Good air movement, clear to auscultation bilaterally, no use of accessory muscles.  Cardiac: RRR, normal S1, S2, no Murmurs. Vascular: swelling 2+ left leg  Scattered small varicose veins Vessel Right Left  Radial Palpable Palpable  PT Palpable Palpable  DP Palpable Palpable  Gastrointestinal: soft, non-distended. No guarding/no peritoneal signs.  Musculoskeletal: M/S 5/5 throughout.  No deformity or atrophy.  Neurologic: CN 2-12 intact. Pain and light touch intact in extremities.  Symmetrical.  Speech is fluent. Motor exam as listed above. Psychiatric: Judgment intact, Mood & affect appropriate for pt's clinical situation. Dermatologic: No rashes or ulcers noted.  No changes consistent with cellulitis. Lymph : No Cervical  lymphadenopathy, no lichenification or skin changes of chronic lymphedema.  CBC Lab Results  Component Value Date   WBC 5.0 02/12/2015   HGB 13.0 02/12/2015   HCT 38.3 02/12/2015   MCV 100.7 (H) 02/12/2015   PLT 247 02/12/2015    BMET    Component Value Date/Time   NA 138 02/12/2015 1341   NA 141 04/07/2012 0214   K 3.7 02/12/2015 1341   K 4.3 04/07/2012 0214   CL 106 02/12/2015 1341   CL 106 04/07/2012 0214   CO2 26 02/12/2015 1341   CO2 25 04/07/2012 0214   GLUCOSE 101 (H) 02/12/2015 1341   GLUCOSE 137 (H) 04/07/2012 0214   BUN 11 02/12/2015 1341   BUN 16 04/07/2012 0214   CREATININE 0.75 02/12/2015 1341   CREATININE 0.95 04/07/2012 0214   CALCIUM  9.7 02/12/2015 1341   CALCIUM 9.2 04/07/2012 0214   GFRNONAA >60 02/12/2015 1341   GFRNONAA >60 04/07/2012 0214   GFRAA >60 02/12/2015 1341   GFRAA >60 04/07/2012 0214   CrCl cannot be calculated (Patient's most recent lab result is older than the maximum 21 days allowed.).  COAG No results found for: INR, PROTIME  Radiology No results found.  Assessment/Plan 1. 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  IVC filter is not indicated at present.  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  ABIs performed today are normal bilaterally  The patient is on anticoagulation and will finish 4 months of therapy in approximately 3 weeks  Elevation was stressed, use of a recliner was discussed.  I have had a long discussion with the patient regarding DVT and post phlebitic changes such as swelling and why it  causes symptoms such as pain.  The patient will wear graduated compression stockings class 1 (20-30 mmHg), beginning after three full days of anticoagulation, on a daily basis a prescription was given. The  patient will  beginning wearing the stockings first thing in the morning and removing them in the evening. The patient is instructed specifically not to sleep in the stockings.  In addition, behavioral modification including elevation during the day and avoidance of prolonged dependency will be initiated.    The patient will continue anticoagulation for now as there have not been any problems or complications at this point.   She is cleared for orthopedic surgery and meniscus repair from a vascular standpoint.  I will discuss with Dr. Posey Pronto perioperative DVT prophylaxis.  It would seem prudent if her repair is in the near future that she just continue her Eliquis.  I do not believe filter insertion is warranted at this time given the findings on venous ultrasound today.   A total of 70 minutes was spent with this patient and greater than 50% was spent in counseling and coordination of care with the patient.  Discussion included the treatment options for vascular disease including indications for surgery and intervention.  Also discussed is the appropriate timing of treatment.  In addition medical therapy was discussed.  2. Venous reflux See #1  3. Primary osteoarthritis of left knee Continue NSAID medications as already ordered, these medications have been reviewed and there are no changes at this time.  Continued activity and therapy was stressed.  Okay for meniscus repair from vascular.   4. DDD (degenerative disc disease), lumbosacral Continue NSAID medications as already ordered, these medications have been reviewed and there are no changes at this time.  Continued activity and therapy was stressed.     Hortencia Pilar, MD  06/21/2017 12:26 PM

## 2017-07-20 ENCOUNTER — Ambulatory Visit (INDEPENDENT_AMBULATORY_CARE_PROVIDER_SITE_OTHER): Payer: Medicare Other | Admitting: Vascular Surgery

## 2017-07-20 ENCOUNTER — Encounter (INDEPENDENT_AMBULATORY_CARE_PROVIDER_SITE_OTHER): Payer: Self-pay | Admitting: Vascular Surgery

## 2017-07-20 VITALS — BP 122/70 | HR 74 | Resp 16 | Ht 65.5 in | Wt 182.8 lb

## 2017-07-20 DIAGNOSIS — K219 Gastro-esophageal reflux disease without esophagitis: Secondary | ICD-10-CM | POA: Insufficient documentation

## 2017-07-20 DIAGNOSIS — I82402 Acute embolism and thrombosis of unspecified deep veins of left lower extremity: Secondary | ICD-10-CM | POA: Diagnosis not present

## 2017-07-20 DIAGNOSIS — M1712 Unilateral primary osteoarthritis, left knee: Secondary | ICD-10-CM | POA: Diagnosis not present

## 2017-07-20 NOTE — Progress Notes (Signed)
MRN : [id number]  Jeanette Harris is a 72 y.o. (1946-02-04) female who presents with chief complaint of  Chief Complaint  Patient presents with  . Follow-up    1 month no studies  .  History of Present Illness:   The patient presents to the office for evaluation of DVT.  DVT was identified several months ago in the left leg at [facility] by Duplex ultrasound.  The initial symptoms were pain and swelling in the lower extremity.  She has a torn left meniscus and is planning to have Dr. Posey Pronto repair this on July 25th.  Currently is she is taking Eliquis 5 mg twice daily.  The patient notes the leg continues to be painful with dependency and swells quite a bite.  Symptoms are much better with elevation.  The patient notes minimal edema in the morning which steadily worsens throughout the day.    The patient has not been using compression therapy at this point.  She relates that her niece died last month of a PE and her brother has had multiple PE's  Current Meds  Medication Sig  . acetaminophen (TYLENOL) 500 MG tablet Take 500 mg by mouth every 6 (six) hours as needed.  Marland Kitchen apixaban (ELIQUIS) 5 MG TABS tablet Take by mouth.  . calcium-vitamin D (OSCAL WITH D) 500-200 MG-UNIT tablet Take 1 tablet by mouth.  . Cyanocobalamin (B-12 COMPLIANCE INJECTION IJ) Inject as directed.  . docusate sodium (COLACE) 250 MG capsule Take 250 mg by mouth daily.  . lansoprazole (PREVACID) 30 MG capsule Take 30 mg by mouth daily at 12 noon.  . Multiple Vitamin (MULTIVITAMIN) capsule Take 1 capsule by mouth daily.  . nortriptyline (PAMELOR) 75 MG capsule Take 75 mg by mouth at bedtime.  Marland Kitchen rOPINIRole (REQUIP) 0.5 MG tablet Take 0.5 mg by mouth at bedtime.  . sucralfate (CARAFATE) 1 g tablet Take 1 g by mouth 4 (four) times daily -  with meals and at bedtime.  . topiramate (TOPAMAX) 50 MG tablet Take 50 mg by mouth 2 (two) times daily.    Past Medical History:  Diagnosis Date  . Arthritis   .  Complication of anesthesia   . COPD (chronic obstructive pulmonary disease) ([place])    patient denies  . DVT (deep venous thrombosis) ([place])   . GERD (gastroesophageal reflux disease)   . Headache   . Heart murmur   . Peripheral vascular disease (HCC)    phlebitis  . Phlebitis   . PONV (postoperative nausea and vomiting)     Past Surgical History:  Procedure Laterality Date  . ABDOMINAL HYSTERECTOMY    . ANKLE SURGERY    . APPENDECTOMY    . BACK SURGERY    . CATARACT EXTRACTION W/PHACO Left 02/13/2016   Procedure: CATARACT EXTRACTION PHACO AND INTRAOCULAR LENS PLACEMENT (IOC);  Surgeon: Leandrew Koyanagi, MD;  Location: [place];  Service: Ophthalmology;  Laterality: Left;  TORIC  . CATARACT EXTRACTION W/PHACO Right 02/27/2016   Procedure: CATARACT EXTRACTION PHACO AND INTRAOCULAR LENS PLACEMENT (IOC);  Surgeon: Leandrew Koyanagi, MD;  Location: [place];  Service: Ophthalmology;  Laterality: Right;  TORIC Latex sensitivity  . CHOLECYSTECTOMY    . COLONOSCOPY    . ESOPHAGOGASTRODUODENOSCOPY    . FOOT SURGERY    . SHOULDER ARTHROSCOPY      Social History Social History   Tobacco Use  . Smoking status: Former Research scientist (life sciences)  . Smokeless tobacco: Never Used  Substance Use Topics  . Alcohol use:  No  . Drug use: No    Family History Family History  Problem Relation Age of Onset  . Breast cancer Maternal Aunt     Allergies  Allergen Reactions  . Aspirin Other (See Comments)    unknown  . Ivp Dye [Iodinated Diagnostic Agents] Nausea And Vomiting  . Nsaids Other (See Comments)    Unknown  . Latex Rash     REVIEW OF SYSTEMS (Negative unless checked)  Constitutional: [] Weight loss  [] Fever  [] Chills Cardiac: [] Chest pain   [] Chest pressure   [] Palpitations   [] Shortness of breath when laying flat   [] Shortness of breath with exertion. Vascular:  [] Pain in legs with walking   [x] Pain in legs at rest  [x] History of DVT   [] Phlebitis   [x] Swelling in  legs   [x] Varicose veins   [] Non-healing ulcers Pulmonary:   [] Uses home oxygen   [] Productive cough   [] Hemoptysis   [] Wheeze  [] COPD   [] Asthma Neurologic:  [] Dizziness   [] Seizures   [] History of stroke   [] History of TIA  [] Aphasia   [] Vissual changes   [] Weakness or numbness in arm   [] Weakness or numbness in leg Musculoskeletal:   [] Joint swelling   [] Joint pain   [] Low back pain Hematologic:  [] Easy bruising  [] Easy bleeding   [] Hypercoagulable state   [] Anemic Gastrointestinal:  [] Diarrhea   [] Vomiting  [] Gastroesophageal reflux/heartburn   [] Difficulty swallowing. Genitourinary:  [] Chronic kidney disease   [] Difficult urination  [] Frequent urination   [] Blood in urine Skin:  [] Rashes   [] Ulcers  Psychological:  [] History of anxiety   []  History of major depression.  Physical Examination  Vitals:   07/20/17 1014  BP: 122/70  Pulse: 74  Resp: 16  Weight: 182 lb 12.8 oz (82.9 kg)  Height: 5' 5.5" (1.664 m)   Body mass index is 29.96 kg/m. Gen: WD/WN, NAD Head: [place]/AT, No temporalis wasting.  Ear/Nose/Throat: Hearing grossly intact, nares w/o erythema or drainage Eyes: PER, EOMI, sclera nonicteric.  Neck: Supple, no large masses.   Pulmonary:  Good air movement, no audible wheezing bilaterally, no use of accessory muscles.  Cardiac: RRR, no JVD Vascular: scattered varicosities present bilaterally.  Mild venous stasis changes to the legs bilaterally.  2+ soft pitting edema Vessel Right Left  Radial Palpable Palpable  PT Palpable Palpable  DP Palpable Palpable  Gastrointestinal: Non-distended. No guarding/no peritoneal signs.  Musculoskeletal: M/S 5/5 throughout.  No deformity or atrophy.  Neurologic: CN 2-12 intact. Symmetrical.  Speech is fluent. Motor exam as listed above. Psychiatric: Judgment intact, Mood & affect appropriate for pt's clinical situation. Dermatologic: mild venous rashes no ulcers noted.  No changes consistent with cellulitis. Lymph : No lichenification  or skin changes of chronic lymphedema.  CBC Lab Results  Component Value Date   WBC 5.0 02/12/2015   HGB 13.0 02/12/2015   HCT 38.3 02/12/2015   MCV 100.7 (H) 02/12/2015   PLT 247 02/12/2015    BMET    Component Value Date/Time   NA 138 02/12/2015 1341   NA 141 04/07/2012 0214   K 3.7 02/12/2015 1341   K 4.3 04/07/2012 0214   CL 106 02/12/2015 1341   CL 106 04/07/2012 0214   CO2 26 02/12/2015 1341   CO2 25 04/07/2012 0214   GLUCOSE 101 (H) 02/12/2015 1341   GLUCOSE 137 (H) 04/07/2012 0214   BUN 11 02/12/2015 1341   BUN 16 04/07/2012 0214   CREATININE 0.75 02/12/2015 1341   CREATININE 0.95 04/07/2012 0214  CALCIUM 9.7 02/12/2015 1341   CALCIUM 9.2 04/07/2012 0214   GFRNONAA >60 02/12/2015 1341   GFRNONAA >60 04/07/2012 0214   GFRAA >60 02/12/2015 1341   GFRAA >60 04/07/2012 0214   CrCl cannot be calculated (Patient's most recent lab result is older than the maximum 21 days allowed.).  COAG No results found for: INR, PROTIME  Radiology No results found.   Assessment/Plan 1. 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  IVC filter is not indicated at present.  The patient is initiated on anticoagulation   Elevation was stressed, use of a recliner was discussed.  I have had a long discussion with the patient regarding DVT and post phlebitic changes such as swelling and why it  causes symptoms such as pain.  The patient will wear graduated compression stockings class 1 (20-30 mmHg), beginning after three full days of anticoagulation, on a daily basis a prescription was given. The patient will  beginning wearing the stockings first thing in the morning and removing them in the evening. The patient is instructed specifically not to sleep in the stockings.  In addition, behavioral modification including elevation during the day and avoidance of  prolonged dependency will be initiated.    The patient will continue anticoagulation for now as there have not been any problems or complications at this point.   I will obtain a hypercoagulable pannal as she has a significant family history of DVT as well as her personal history  - VAS [place] LOWER EXTREMITY VENOUS (DVT); Future - Hypercoagulable panel, comprehensive; Future  2. Primary osteoarthritis of left knee Continue NSAID medications as already ordered, these medications have been reviewed and there are no changes at this time.  Continued activity and therapy was stressed.   3. Gastroesophageal reflux disease, esophagitis presence not specified Continue antihypertensive medications as already ordered, these medications have been reviewed and there are no changes at this time.  Avoidence of caffeine and alcohol  Moderate elevation of the head of the bed     Hortencia Pilar, MD  07/20/2017 10:42 AM

## 2017-08-17 ENCOUNTER — Telehealth (INDEPENDENT_AMBULATORY_CARE_PROVIDER_SITE_OTHER): Payer: Self-pay

## 2017-08-17 NOTE — Telephone Encounter (Signed)
Patient called asking about the procedure she will get done at [facility]  and was concerned about the insurance coverage.I spoke with GS and the patient should get the hyper coagulable panel 2weeks before coming in the office for her next appointment on 09/24/17.Any questions about insurance the patient should speak to the hospital

## 2017-08-18 ENCOUNTER — Telehealth (INDEPENDENT_AMBULATORY_CARE_PROVIDER_SITE_OTHER): Payer: Self-pay

## 2017-08-18 NOTE — Telephone Encounter (Signed)
Patient called to find out the name of the testing that Dr. Delana Meyer has ordered for her to have done over at the hospital. I called her back and gave her the name of the test, and I also informed her to contact the hospital to get the cost of the testing and how much it's going to cost her.

## 2017-08-20 ENCOUNTER — Other Ambulatory Visit (INDEPENDENT_AMBULATORY_CARE_PROVIDER_SITE_OTHER): Payer: Self-pay | Admitting: Vascular Surgery

## 2017-08-20 DIAGNOSIS — I82402 Acute embolism and thrombosis of unspecified deep veins of left lower extremity: Secondary | ICD-10-CM

## 2017-08-20 NOTE — Progress Notes (Signed)
I will reorder the hypercoagulable panel that was ordered last month at the time of her visit.  Patient went to have it drawn in the lab said there are no orders.  There is clearly an order placed in early May.

## 2017-08-31 ENCOUNTER — Other Ambulatory Visit
Admission: RE | Admit: 2017-08-31 | Discharge: 2017-08-31 | Disposition: A | Discharge status: Home / Self Care | Payer: Medicare Other | Source: Ambulatory Visit | Attending: Vascular Surgery | Admitting: Vascular Surgery

## 2017-08-31 DIAGNOSIS — D6859 Other primary thrombophilia: Secondary | ICD-10-CM | POA: Insufficient documentation

## 2017-08-31 DIAGNOSIS — I82409 Acute embolism and thrombosis of unspecified deep veins of unspecified lower extremity: Secondary | ICD-10-CM | POA: Insufficient documentation

## 2017-08-31 DIAGNOSIS — I82402 Acute embolism and thrombosis of unspecified deep veins of left lower extremity: Secondary | ICD-10-CM

## 2017-08-31 LAB — ANTITHROMBIN III: AntiThromb III Func: 104 % (ref 75–120)

## 2017-09-01 LAB — HOMOCYSTEINE: Homocysteine: 8.2 umol/L (ref 0.0–15.0)

## 2017-09-01 LAB — LUPUS ANTICOAGULANT PANEL
DRVVT: 27.1 s (ref 0.0–47.0)
PTT Lupus Anticoagulant: 28.5 s (ref 0.0–51.9)

## 2017-09-01 LAB — PROTEIN S, TOTAL: Protein S Ag, Total: 105 % (ref 60–150)

## 2017-09-01 LAB — PROTEIN S ACTIVITY: Protein S Activity: 100 % (ref 63–140)

## 2017-09-01 LAB — CARDIOLIPIN ANTIBODIES, IGG, IGM, IGA: Anticardiolipin IgA: <9 APL U/mL (ref 0–11)

## 2017-09-01 LAB — PROTEIN C ACTIVITY: Protein C Activity: 147 % (ref 73–180)

## 2017-09-02 LAB — BETA-2-GLYCOPROTEIN I ABS, IGG/M/A: Beta-2-Glycoprotein I IgA: <9 GPI IgA units (ref 0–25)

## 2017-09-02 LAB — PROTEIN C, TOTAL: Protein C, Total: 128 % (ref 60–150)

## 2017-09-04 LAB — FACTOR 5 LEIDEN

## 2017-09-07 LAB — PROTHROMBIN GENE MUTATION

## 2017-09-23 ENCOUNTER — Telehealth (INDEPENDENT_AMBULATORY_CARE_PROVIDER_SITE_OTHER): Payer: Self-pay

## 2017-09-23 ENCOUNTER — Other Ambulatory Visit: Payer: Self-pay

## 2017-09-23 ENCOUNTER — Encounter: Payer: Self-pay | Admitting: *Deleted

## 2017-09-23 NOTE — Telephone Encounter (Signed)
Patient called and stated that there is a scheduling conflict because her appointment was changed to a different day. She would like a call back today to get clarification.

## 2017-09-24 ENCOUNTER — Ambulatory Visit (INDEPENDENT_AMBULATORY_CARE_PROVIDER_SITE_OTHER): Payer: Medicare Other | Admitting: Vascular Surgery

## 2017-09-28 ENCOUNTER — Encounter (INDEPENDENT_AMBULATORY_CARE_PROVIDER_SITE_OTHER): Payer: Self-pay | Admitting: Vascular Surgery

## 2017-09-28 ENCOUNTER — Ambulatory Visit (INDEPENDENT_AMBULATORY_CARE_PROVIDER_SITE_OTHER): Payer: Medicare Other | Admitting: Vascular Surgery

## 2017-09-28 VITALS — BP 117/73 | HR 78 | Resp 16 | Ht 65.5 in | Wt 181.8 lb

## 2017-09-28 DIAGNOSIS — I82402 Acute embolism and thrombosis of unspecified deep veins of left lower extremity: Secondary | ICD-10-CM | POA: Diagnosis not present

## 2017-09-28 DIAGNOSIS — I872 Venous insufficiency (chronic) (peripheral): Secondary | ICD-10-CM | POA: Diagnosis not present

## 2017-09-28 DIAGNOSIS — M1712 Unilateral primary osteoarthritis, left knee: Secondary | ICD-10-CM | POA: Diagnosis not present

## 2017-09-28 NOTE — Progress Notes (Signed)
MRN : [id number]  Jeanette Harris is a 72 y.o. (26-Sep-1945) female who presents with chief complaint of  Chief Complaint  Patient presents with  . Follow-up    40month no studies  .  History of Present Illness:  The patient presents to the office for evaluation of DVT.  DVT was identified at [facility] by Duplex ultrasound.  The initial symptoms were pain and swelling in the lower extremity.  The patient notes her leg  is not very painful with dependency and swells just a little.  Symptoms are better with elevation.  The patient notes minimal edema in the morning which steadily worsens throughout the day.  Patient was taken off her Eliquis at the last appointment and has done well without anticoagulation.  The patient has not been using compression therapy at this point.  No SOB or pleuritic chest pains.  No cough or hemoptysis.  No blood per rectum or blood in any sputum.  No excessive bruising per the patient.       Current Meds  Medication Sig  . acetaminophen (TYLENOL) 500 MG tablet Take 500 mg by mouth every 6 (six) hours as needed.  . calcium-vitamin D (OSCAL WITH D) 500-200 MG-UNIT tablet Take 1 tablet by mouth.  . Cyanocobalamin (B-12 COMPLIANCE INJECTION IJ) Inject as directed.  . docusate sodium (COLACE) 250 MG capsule Take 250 mg by mouth daily.  . lansoprazole (PREVACID) 30 MG capsule Take 30 mg by mouth daily at 12 noon.  . Multiple Vitamin (MULTIVITAMIN) capsule Take 1 capsule by mouth daily.  . nortriptyline (PAMELOR) 75 MG capsule Take 75 mg by mouth at bedtime.  Marland Kitchen rOPINIRole (REQUIP) 0.5 MG tablet Take 0.5 mg by mouth at bedtime.  . sucralfate (CARAFATE) 1 g tablet Take 1 g by mouth 4 (four) times daily -  with meals and at bedtime.  . topiramate (TOPAMAX) 50 MG tablet Take 50 mg by mouth 2 (two) times daily.    Past Medical History:  Diagnosis Date  . Arthritis   . Complication of anesthesia   . COPD (chronic obstructive pulmonary disease) ([place])    patient  denies  . DVT (deep venous thrombosis) ([place]) 04/01/2017   left lower leg  . GERD (gastroesophageal reflux disease)   . Headache   . Heart murmur   . Peripheral vascular disease (HCC)    phlebitis  . Phlebitis   . PONV (postoperative nausea and vomiting)     Past Surgical History:  Procedure Laterality Date  . ABDOMINAL HYSTERECTOMY    . ANKLE SURGERY    . APPENDECTOMY    . BACK SURGERY    . CATARACT EXTRACTION W/PHACO Left 02/13/2016   Procedure: CATARACT EXTRACTION PHACO AND INTRAOCULAR LENS PLACEMENT (IOC);  Surgeon: Leandrew Koyanagi, MD;  Location: [place];  Service: Ophthalmology;  Laterality: Left;  TORIC  . CATARACT EXTRACTION W/PHACO Right 02/27/2016   Procedure: CATARACT EXTRACTION PHACO AND INTRAOCULAR LENS PLACEMENT (IOC);  Surgeon: Leandrew Koyanagi, MD;  Location: [place];  Service: Ophthalmology;  Laterality: Right;  TORIC Latex sensitivity  . CHOLECYSTECTOMY    . COLONOSCOPY    . ESOPHAGOGASTRODUODENOSCOPY    . FOOT SURGERY    . SHOULDER ARTHROSCOPY      Social History Social History   Tobacco Use  . Smoking status: Former Research scientist (life sciences)  . Smokeless tobacco: Never Used  . Tobacco comment: quit in the 1980s  Substance Use Topics  . Alcohol use: No  . Drug use: No  Family History Family History  Problem Relation Age of Onset  . Breast cancer Maternal Aunt     Allergies  Allergen Reactions  . Aspirin Other (See Comments)    unknown  . Ivp Dye [Iodinated Diagnostic Agents] Nausea And Vomiting  . Nsaids Other (See Comments)    Unknown  . Latex Rash     REVIEW OF SYSTEMS (Negative unless checked)  Constitutional: [] Weight loss  [] Fever  [] Chills Cardiac: [] Chest pain   [] Chest pressure   [] Palpitations   [] Shortness of breath when laying flat   [] Shortness of breath with exertion. Vascular:  [] Pain in legs with walking   [] Pain in legs at rest  [] History of DVT   [] Phlebitis   [] Swelling in legs   [] Varicose veins    [] Non-healing ulcers Pulmonary:   [] Uses home oxygen   [] Productive cough   [] Hemoptysis   [] Wheeze  [] COPD   [] Asthma Neurologic:  [] Dizziness   [] Seizures   [] History of stroke   [] History of TIA  [] Aphasia   [] Vissual changes   [] Weakness or numbness in arm   [] Weakness or numbness in leg Musculoskeletal:   [] Joint swelling   [] Joint pain   [] Low back pain Hematologic:  [] Easy bruising  [] Easy bleeding   [] Hypercoagulable state   [] Anemic Gastrointestinal:  [] Diarrhea   [] Vomiting  [] Gastroesophageal reflux/heartburn   [] Difficulty swallowing. Genitourinary:  [] Chronic kidney disease   [] Difficult urination  [] Frequent urination   [] Blood in urine Skin:  [] Rashes   [] Ulcers  Psychological:  [] History of anxiety   []  History of major depression.  Physical Examination  Vitals:   09/28/17 1537  BP: 117/73  Pulse: 78  Resp: 16  Weight: 181 lb 12.8 oz (82.5 kg)  Height: 5' 5.5" (1.664 m)   Body mass index is 29.79 kg/m. Gen: WD/WN, NAD Head: [place]/AT, No temporalis wasting.  Ear/Nose/Throat: Hearing grossly intact, nares w/o erythema or drainage Eyes: PER, EOMI, sclera nonicteric.  Neck: Supple, no large masses.   Pulmonary:  Good air movement, no audible wheezing bilaterally, no use of accessory muscles.  Cardiac: RRR, no JVD Vascular: scattered varicosities present bilaterally.  Mild venous stasis changes to the legs bilaterally.  Trace soft pitting edema Vessel Right Left  Radial Palpable Palpable  PT Palpable Palpable  DP Palpable Palpable  Gastrointestinal: Non-distended. No guarding/no peritoneal signs.  Musculoskeletal: M/S 5/5 throughout.  No deformity or atrophy.  Neurologic: CN 2-12 intact. Symmetrical.  Speech is fluent. Motor exam as listed above. Psychiatric: Judgment intact, Mood & affect appropriate for pt's clinical situation. Dermatologic: Mild venous rashes no ulcers noted.  No changes consistent with cellulitis. Lymph : No lichenification or skin changes of  chronic lymphedema.  CBC Lab Results  Component Value Date   WBC 5.0 02/12/2015   HGB 13.0 02/12/2015   HCT 38.3 02/12/2015   MCV 100.7 (H) 02/12/2015   PLT 247 02/12/2015    BMET    Component Value Date/Time   NA 138 02/12/2015 1341   NA 141 04/07/2012 0214   K 3.7 02/12/2015 1341   K 4.3 04/07/2012 0214   CL 106 02/12/2015 1341   CL 106 04/07/2012 0214   CO2 26 02/12/2015 1341   CO2 25 04/07/2012 0214   GLUCOSE 101 (H) 02/12/2015 1341   GLUCOSE 137 (H) 04/07/2012 0214   BUN 11 02/12/2015 1341   BUN 16 04/07/2012 0214   CREATININE 0.75 02/12/2015 1341   CREATININE 0.95 04/07/2012 0214   CALCIUM 9.7 02/12/2015 1341   CALCIUM  9.2 04/07/2012 0214   GFRNONAA >60 02/12/2015 1341   GFRNONAA >60 04/07/2012 0214   GFRAA >60 02/12/2015 1341   GFRAA >60 04/07/2012 0214   CrCl cannot be calculated (Patient's most recent lab result is older than the maximum 21 days allowed.).  COAG No results found for: INR, PROTIME  Radiology No results found.    Assessment/Plan  1. Deep vein thrombosis (DVT) of left lower extremity, unspecified chronicity, unspecified vein (HCC) Patient has done well off anticoagulation.  She continues to elevate.  She has minimal postphlebitic symptoms.  She is scheduled for knee surgery upcoming she is cleared from a vascular standpoint.  I would recommend that she used her remaining Eliquis as her post procedure prophylaxis and instructed her to cut her 5 mg pills in half and take 2.5 mg in the morning and 2.5 mg in the evening for 2 weeks post procedure.  Patient is again urged to wear graduated compression.  I have reviewed the patient's hypercoagulable panel and printed out a copy for her it was completely negative.  Therefore there is no indication to restart anticoagulation at this time.  She will follow-up with me as needed.  2. Primary osteoarthritis of left knee Plans for surgery with Dr. Posey Pronto  3. Venous reflux Graduated compression is  recommended.   Hortencia Pilar, MD  09/28/2017 3:56 PM

## 2017-09-30 ENCOUNTER — Encounter (INDEPENDENT_AMBULATORY_CARE_PROVIDER_SITE_OTHER): Payer: Self-pay | Admitting: Vascular Surgery

## 2017-10-01 ENCOUNTER — Ambulatory Visit
Admission: RE | Admit: 2017-10-01 | Discharge: 2017-10-01 | Disposition: A | Discharge status: Home / Self Care | Payer: Medicare Other | Source: Ambulatory Visit | Attending: Orthopedic Surgery | Admitting: Orthopedic Surgery

## 2017-10-01 ENCOUNTER — Ambulatory Visit: Payer: Medicare Other | Admitting: Anesthesiology

## 2017-10-01 ENCOUNTER — Encounter
Admission: RE | Disposition: A | Discharge status: Home / Self Care | Payer: Self-pay | Source: Ambulatory Visit | Attending: Orthopedic Surgery

## 2017-10-01 DIAGNOSIS — M1712 Unilateral primary osteoarthritis, left knee: Secondary | ICD-10-CM | POA: Diagnosis not present

## 2017-10-01 DIAGNOSIS — R011 Cardiac murmur, unspecified: Secondary | ICD-10-CM | POA: Diagnosis not present

## 2017-10-01 DIAGNOSIS — S83232A Complex tear of medial meniscus, current injury, left knee, initial encounter: Secondary | ICD-10-CM | POA: Diagnosis present

## 2017-10-01 DIAGNOSIS — Z888 Allergy status to other drugs, medicaments and biological substances status: Secondary | ICD-10-CM | POA: Insufficient documentation

## 2017-10-01 DIAGNOSIS — X58XXXA Exposure to other specified factors, initial encounter: Secondary | ICD-10-CM | POA: Diagnosis not present

## 2017-10-01 DIAGNOSIS — Z87891 Personal history of nicotine dependence: Secondary | ICD-10-CM | POA: Diagnosis not present

## 2017-10-01 DIAGNOSIS — Z91040 Latex allergy status: Secondary | ICD-10-CM | POA: Diagnosis not present

## 2017-10-01 DIAGNOSIS — Z86010 Personal history of colonic polyps: Secondary | ICD-10-CM | POA: Insufficient documentation

## 2017-10-01 DIAGNOSIS — G629 Polyneuropathy, unspecified: Secondary | ICD-10-CM | POA: Insufficient documentation

## 2017-10-01 DIAGNOSIS — Z91041 Radiographic dye allergy status: Secondary | ICD-10-CM | POA: Diagnosis not present

## 2017-10-01 DIAGNOSIS — K589 Irritable bowel syndrome without diarrhea: Secondary | ICD-10-CM | POA: Diagnosis not present

## 2017-10-01 DIAGNOSIS — Z886 Allergy status to analgesic agent status: Secondary | ICD-10-CM | POA: Diagnosis not present

## 2017-10-01 DIAGNOSIS — I10 Essential (primary) hypertension: Secondary | ICD-10-CM | POA: Diagnosis not present

## 2017-10-01 DIAGNOSIS — S83282A Other tear of lateral meniscus, current injury, left knee, initial encounter: Secondary | ICD-10-CM | POA: Insufficient documentation

## 2017-10-01 DIAGNOSIS — K219 Gastro-esophageal reflux disease without esophagitis: Secondary | ICD-10-CM | POA: Insufficient documentation

## 2017-10-01 DIAGNOSIS — I739 Peripheral vascular disease, unspecified: Secondary | ICD-10-CM | POA: Diagnosis not present

## 2017-10-01 DIAGNOSIS — E785 Hyperlipidemia, unspecified: Secondary | ICD-10-CM | POA: Diagnosis not present

## 2017-10-01 HISTORY — PX: KNEE ARTHROSCOPY WITH MENISCAL REPAIR: SHX5653

## 2017-10-01 SURGERY — ARTHROSCOPY, KNEE, WITH MENISCUS REPAIR
Anesthesia: General | Laterality: Left | Wound class: "Clean "

## 2017-10-01 MED ORDER — FENTANYL CITRATE (PF) 100 MCG/2ML IJ SOLN
25.0000 ug | INTRAMUSCULAR | Status: DC | PRN
  Administered 2017-10-01 (×3): 25 ug via INTRAVENOUS

## 2017-10-01 MED ORDER — LIDOCAINE HCL (CARDIAC) PF 100 MG/5ML IV SOSY
PREFILLED_SYRINGE | INTRAVENOUS | Status: DC | PRN
  Administered 2017-10-01: 30 mg via INTRATRACHEAL

## 2017-10-01 MED ORDER — PROPOFOL 10 MG/ML IV BOLUS
INTRAVENOUS | Status: DC | PRN
  Administered 2017-10-01: 140 mg via INTRAVENOUS
  Administered 2017-10-01: 40 mg via INTRAVENOUS

## 2017-10-01 MED ORDER — ONDANSETRON HCL 4 MG/2ML IJ SOLN
INTRAMUSCULAR | Status: DC | PRN
  Administered 2017-10-01: 4 mg via INTRAVENOUS

## 2017-10-01 MED ORDER — OXYCODONE HCL 5 MG PO TABS: 5.0000 mg | ORAL_TABLET | ORAL | 0 refills | Status: AC | PRN

## 2017-10-01 MED ORDER — DEXAMETHASONE SODIUM PHOSPHATE 4 MG/ML IJ SOLN
INTRAMUSCULAR | Status: DC | PRN
  Administered 2017-10-01: 4 mg via INTRAVENOUS

## 2017-10-01 MED ORDER — DEXTROSE 5 % IV SOLN
2000.0000 mg | Freq: Once | INTRAVENOUS | Status: AC
  Administered 2017-10-01: 2000 mg via INTRAVENOUS

## 2017-10-01 MED ORDER — ONDANSETRON 4 MG PO TBDP: 4.0000 mg | ORAL_TABLET | Freq: Three times a day (TID) | ORAL | 0 refills | Status: DC | PRN

## 2017-10-01 MED ORDER — OXYCODONE HCL 5 MG/5ML PO SOLN
5.0000 mg | Freq: Once | ORAL | Status: AC | PRN
  Administered 2017-10-01: 5 mg via ORAL

## 2017-10-01 MED ORDER — LIDOCAINE-EPINEPHRINE 1 %-1:100000 IJ SOLN
INTRAMUSCULAR | Status: DC | PRN
  Administered 2017-10-01: 30 mL

## 2017-10-01 MED ORDER — ACETAMINOPHEN 10 MG/ML IV SOLN
1000.0000 mg | Freq: Once | INTRAVENOUS | Status: AC
  Administered 2017-10-01: 1000 mg via INTRAVENOUS

## 2017-10-01 MED ORDER — BUPIVACAINE HCL 0.5 % IJ SOLN
INTRAMUSCULAR | Status: DC | PRN
  Administered 2017-10-01: 30 mL

## 2017-10-01 MED ORDER — SCOPOLAMINE 1 MG/3DAYS TD PT72
1.0000 | MEDICATED_PATCH | TRANSDERMAL | Status: DC
  Administered 2017-10-01: 1.5 mg via TRANSDERMAL

## 2017-10-01 MED ORDER — LACTATED RINGERS IV SOLN
INTRAVENOUS | Status: DC
  Administered 2017-10-01: 12:00:00 via INTRAVENOUS

## 2017-10-01 MED ORDER — MIDAZOLAM HCL 5 MG/5ML IJ SOLN
INTRAMUSCULAR | Status: DC | PRN
  Administered 2017-10-01: 1 mg via INTRAVENOUS

## 2017-10-01 MED ORDER — OXYCODONE HCL 5 MG PO TABS: 5.0000 mg | ORAL_TABLET | Freq: Once | ORAL | Status: AC | PRN

## 2017-10-01 MED ORDER — ACETAMINOPHEN 500 MG PO TABS: 1000.0000 mg | ORAL_TABLET | Freq: Three times a day (TID) | ORAL | 2 refills | Status: AC

## 2017-10-01 MED ORDER — LACTATED RINGERS IV SOLN
INTRAVENOUS | Status: DC
  Administered 2017-10-01: 13:00:00 via INTRAVENOUS

## 2017-10-01 MED ORDER — APIXABAN 5 MG PO TABS: 2.5000 mg | ORAL_TABLET | Freq: Two times a day (BID) | ORAL | 0 refills | Status: DC

## 2017-10-01 MED ORDER — FENTANYL CITRATE (PF) 100 MCG/2ML IJ SOLN
INTRAMUSCULAR | Status: DC | PRN
  Administered 2017-10-01: 50 ug via INTRAVENOUS
  Administered 2017-10-01 (×2): 25 ug via INTRAVENOUS

## 2017-10-01 MED ORDER — PROMETHAZINE HCL 25 MG/ML IJ SOLN: 6.2500 mg | INTRAMUSCULAR | Status: DC | PRN

## 2017-10-01 SURGICAL SUPPLY — 57 items
"PENCIL ELECTRO HAND CTR " (MISCELLANEOUS) IMPLANT
ADAPTER IRRIG TUBE 2 SPIKE SOL (ADAPTER) ×4 IMPLANT
BANDAGE ACE 6X5 VEL STRL LF (GAUZE/BANDAGES/DRESSINGS) ×2 IMPLANT
BLADE SURG 15 STRL LF DISP TIS (BLADE) ×1 IMPLANT
BLADE SURG 15 STRL SS (BLADE) ×1
BLADE SURG SZ11 CARB STEEL (BLADE) ×2 IMPLANT
BNDG COHESIVE 4X5 TAN STRL (GAUZE/BANDAGES/DRESSINGS) ×2 IMPLANT
BNDG ESMARK 6X12 TAN STRL LF (GAUZE/BANDAGES/DRESSINGS) IMPLANT
BRACE KNEE POST OP SHORT (BRACE) ×1 IMPLANT
BUR RADIUS 3.5 (BURR) ×2 IMPLANT
BUR RADIUS 4.0X18.5 (BURR) IMPLANT
CHLORAPREP W/TINT 26ML (MISCELLANEOUS) ×2 IMPLANT
CLEANER CAUTERY TIP 5X5 PAD (MISCELLANEOUS) ×1 IMPLANT
COOLER POLAR GLACIER W/PUMP (MISCELLANEOUS) ×2 IMPLANT
COVER LIGHT HANDLE UNIVERSAL (MISCELLANEOUS) ×4 IMPLANT
CUFF TOURN SGL QUICK 30 (MISCELLANEOUS) ×1
CUFF TOURN SGL QUICK 34 (TOURNIQUET CUFF)
CUFF TRNQT CYL 34X4X40X1 (TOURNIQUET CUFF) IMPLANT
CUFF TRNQT CYL LO 30X4X (MISCELLANEOUS) IMPLANT
DRAPE IMP U-DRAPE 54X76 (DRAPES) ×2 IMPLANT
DRAPE LEGGINS SURG 28X43 STRL (DRAPES) ×2 IMPLANT
GAUZE SPONGE 4X4 12PLY STRL (GAUZE/BANDAGES/DRESSINGS) ×2 IMPLANT
GLOVE BIO SURGEON STRL SZ7.5 (GLOVE) ×2 IMPLANT
GLOVE BIOGEL PI IND STRL 8 (GLOVE) ×2 IMPLANT
GLOVE BIOGEL PI INDICATOR 8 (GLOVE)
GLOVE INDICATOR 7.5 STRL GRN (GLOVE) ×2 IMPLANT
GLOVE PI ULTRA LF STRL 7.5 (GLOVE) IMPLANT
GLOVE PI ULTRA NON LATEX 7.5 (GLOVE) ×2
GOWN STRL REUS W/ TWL LRG LVL3 (GOWN DISPOSABLE) ×1 IMPLANT
GOWN STRL REUS W/ TWL XL LVL3 (GOWN DISPOSABLE) ×1 IMPLANT
GOWN STRL REUS W/TWL LRG LVL3 (GOWN DISPOSABLE) ×1
GOWN STRL REUS W/TWL XL LVL3 (GOWN DISPOSABLE) ×1
IV LACTATED RINGER IRRG 3000ML (IV SOLUTION) ×10
IV LR IRRIG 3000ML ARTHROMATIC (IV SOLUTION) ×10 IMPLANT
KIT TURNOVER KIT A (KITS) ×2 IMPLANT
MAT ABSORB  FLUID 56X50 GRAY (MISCELLANEOUS) ×2
MAT ABSORB FLUID 56X50 GRAY (MISCELLANEOUS) ×2 IMPLANT
NEPTUNE MANIFOLD (MISCELLANEOUS) ×2 IMPLANT
PACK ARTHROSCOPY KNEE (MISCELLANEOUS) ×2 IMPLANT
PAD CLEANER CAUTERY TIP 5X5 (MISCELLANEOUS)
PAD WRAPON POLAR KNEE (MISCELLANEOUS) ×1 IMPLANT
PADDING CAST BLEND 6X4 STRL (MISCELLANEOUS) ×1 IMPLANT
PADDING STRL CAST 6IN (MISCELLANEOUS) ×1
PENCIL ELECTRO HAND CTR (MISCELLANEOUS) IMPLANT
SET TUBE SUCT SHAVER OUTFL 24K (TUBING) ×2 IMPLANT
SET TUBE TIP INTRA-ARTICULAR (MISCELLANEOUS) ×2 IMPLANT
SUT ETHILON 3 0 FSLX (SUTURE) ×2 IMPLANT
SUT FIBERWIRE 2-0 18 17.9 3/8 (SUTURE)
SUT VIC AB 0 CT1 27 (SUTURE)
SUT VIC AB 0 CT1 27XCR 8 STRN (SUTURE) IMPLANT
SUT VIC AB 2-0 CT1 27 (SUTURE)
SUT VIC AB 2-0 CT1 TAPERPNT 27 (SUTURE) IMPLANT
SUTURE FIBERWR 2-0 18 17.9 3/8 (SUTURE) IMPLANT
TUBING ARTHRO INFLOW-ONLY STRL (TUBING) ×2 IMPLANT
WAND HAND CNTRL MULTIVAC 50 (MISCELLANEOUS) IMPLANT
WAND HAND CNTRL MULTIVAC 90 (MISCELLANEOUS) ×1 IMPLANT
WRAPON POLAR PAD KNEE (MISCELLANEOUS) ×2

## 2017-10-01 NOTE — Op Note (Addendum)
Operative Note    SURGERY DATE: 10/01/2017   PRE-OP DIAGNOSIS:  1. Left medial meniscus tear 2. Left lateral meniscus tear 3. Left knee degenerative changes   POST-OP DIAGNOSIS:  1. Left medial meniscus tear 2. Left lateral meniscus tear 3 Left knee degenerative changes   PROCEDURES:  1. Left knee arthroscopy, partial medial and partial lateral meniscectomy 2. Chondroplasty of left knee medial and lateral compartments   SURGEON: Cato Mulligan, MD   ANESTHESIA: Gen   ESTIMATED BLOOD LOSS: minimal   TOTAL IV FLUIDS: per anesthesia   INDICATION(S):  Jeanette Harris is a 72 y.o. female with signs and symptoms as well as MRI finding of medial meniscus tear and lateral meniscus tear. She was found to have DVT on her MRI and confirmed with DVT scan. She was on anticoagulation for ~6 months prior to the procedure. She has known mild degenerative changes and understands that she may not get complete or long-lasting pain relief from this operation. After discussion of risks, benefits, and alternatives to surgery, the patient elected to proceed.   OPERATIVE FINDINGS:    Examination under anesthesia: A careful examination under anesthesia was performed.  Passive range of motion was: Hyperextension: 2.  Extension: 0.  Flexion: 120.  Lachman: normal. Pivot Shift: normal.  Posterior drawer: normal.  Varus stability in full extension: normal.  Varus stability in 30 degrees of flexion: normal.  Valgus stability in full extension: normal.  Valgus stability in 30 degrees of flexion: normal.   Intra-operative findings: A thorough arthroscopic examination of the knee was performed.  The findings are: 1. Suprapatellar pouch: Normal 2. Undersurface of median ridge: Deep chondral fissure 3. Medial patellar facet: Grade 2 degenerative changes 4. Lateral patellar facet: Grade 1 degenerative changes 5. Trochlea: Grade 1 degenerative changes 6. Lateral gutter/popliteus tendon: Normal 7. Hoffa's fat pad:  Inflamed 8. Medial gutter/plica: Normal 9. ACL: Normal 10. PCL: Normal 11. Medial meniscus: Complex tear with both radial component adjacent to the medial meniscus root. This was not a full meniscal width tear as ~15% of the meniscus rim at the posterior horn adjacent to the root was intact after debriding the tear. Root was intact and stable to probing. 12. Medial compartment cartilage: Focal area of Grade 3 degenerative changes on MFC measuring ~50mm x 63mm. Remainder of cartilage surfaces normal to grade 1 changes. 13. Lateral meniscus: Tear of ~20% of the anterior fibers of the lateral meniscus root. Root was intact and stable to probing. Mild fraying of the anterior horn fibers ~10% of the meniscal width.  14. Lateral compartment cartilage: ~15 x75mm focal Grade 4 chondral defect of LFC with surrounding softening of cartilage. Remainder of compartment had normal or Grade 1 degenerative changes.    OPERATIVE REPORT:     I identified Jeanette Harris in the pre-operative holding area. I marked the operative knee with my initials. I reviewed the risks and benefits of the proposed surgical intervention and the patient (and/or patient's guardian) wished to proceed. The patient was transferred to the operative suite and placed in the supine position with all bony prominences padded.  Anesthesia was administered. Appropriate IV antibiotics were administered prior to incision. The extremity was then prepped and draped in standard fashion. A time out was performed confirming the correct extremity, correct patient, and correct procedure.   Arthroscopy portals were marked. Local anesthetic was injected to the planned portal sites. The anterolateral portal was established with an 11 blade.      The  arthroscope was placed in the anterolateral portal and then into the suprapatellar pouch.  A diagnostic knee scope was completed with the above findings. The medial meniscus and lateral meniscus tears were  identified.   Next the medial portal was established under needle localization. The lateral meniscus tear was debrided using an oscillating shaver and arthroscopic biter until the meniscus had stable edges. Lateral compartment chondroplasty was performed by debriding the grade 4 lesion until stable cartilage edges were obtained. Next, the MCL was pie-crusted to improve visualization of the posterior horn of the medial meniscus. The meniscal tear was debrided using an arthroscopic biter and an oscillating shaver until the meniscus had stable borders. A chondroplasty was performed of the medial compartment such that there were stable cartilage edges without any loose fragments of cartilage. Arthroscopic fluid was removed from the joint.   The portals were closed with 3-0 Nylon suture. Sterile dressings included Xeroform, 4x4s, Sof-Rol, and Bias wrap. A Polarcare was placed.  The patient was then awakened and taken to the PACU hemodynamically stable without complication.     POSTOPERATIVE PLAN: The patient will be discharged home today once they meet PACU criteria. Eliquis 2.5mg  bid for 2 weeks for DVT prophylaxis (per recommendations of her vascular surgeon). Physical therapy will start on POD#3-4. Weight-bearing as tolerated. They will follow up in 2 weeks per protocol.

## 2017-10-01 NOTE — Transfer of Care (Signed)
Immediate Anesthesia Transfer of Care Note  Patient: Jeanette Harris  Procedure(s) Performed: KNEE ARTHROSCOPY PARTIAL AND LATERAL MENISCECTOMY AND CHONDROPLASTY (Left )  Patient Location: PACU  Anesthesia Type: General  Level of Consciousness: awake, alert  and patient cooperative  Airway and Oxygen Therapy: Patient Spontanous Breathing and Patient connected to supplemental oxygen  Post-op Assessment: Post-op Vital signs reviewed, Patient's Cardiovascular Status Stable, Respiratory Function Stable, Patent Airway and No signs of Nausea or vomiting  Post-op Vital Signs: Reviewed and stable  Complications: No apparent anesthesia complications

## 2017-10-01 NOTE — Anesthesia Procedure Notes (Signed)
Procedure Name: LMA Insertion Date/Time: 10/01/2017 1:02 PM Performed by: Cameron Ali, CRNA Pre-anesthesia Checklist: Patient identified, Emergency Drugs available, Suction available, Timeout performed and Patient being monitored Patient Re-evaluated:Patient Re-evaluated prior to induction Oxygen Delivery Method: Circle system utilized Preoxygenation: Pre-oxygenation with 100% oxygen Induction Type: IV induction LMA: LMA inserted LMA Size: 4.0 and 3.0 Number of attempts: 1 Placement Confirmation: positive ETCO2 and breath sounds checked- equal and bilateral Tube secured with: Tape Dental Injury: Teeth and Oropharynx as per pre-operative assessment

## 2017-10-01 NOTE — H&P (Signed)
Paper H&P to be scanned into permanent record. H&P reviewed. No significant changes noted.  

## 2017-10-01 NOTE — Anesthesia Preprocedure Evaluation (Addendum)
Anesthesia Evaluation    History of Anesthesia Complications (+) PONV  Airway Mallampati: II  TM Distance: >3 FB Neck ROM: Full    Dental no notable dental hx.    Pulmonary neg COPD, former smoker,  Denies COPD   Pulmonary exam normal breath sounds clear to auscultation       Cardiovascular + Peripheral Vascular Disease  negative cardio ROS Normal cardiovascular exam+ Valvular Problems/Murmurs  Rhythm:Regular Rate:Normal  Known heart murmur, no symptoms. Excellent functional capacity   Neuro/Psych  Headaches,    GI/Hepatic GERD  Controlled,  Endo/Other    Renal/GU      Musculoskeletal  (+) Arthritis ,   Abdominal   Peds  Hematology   Anesthesia Other Findings   Reproductive/Obstetrics                            Anesthesia Physical Anesthesia Plan  ASA: II  Anesthesia Plan: General   Post-op Pain Management:    Induction: Intravenous  PONV Risk Score and Plan:   Airway Management Planned:   Additional Equipment:   Intra-op Plan:   Post-operative Plan: Extubation in OR  Informed Consent: I have reviewed the patients History and Physical, chart, labs and discussed the procedure including the risks, benefits and alternatives for the proposed anesthesia with the patient or authorized representative who has indicated his/her understanding and acceptance.   Dental advisory given  Plan Discussed with: CRNA  Anesthesia Plan Comments:         Anesthesia Quick Evaluation

## 2017-10-01 NOTE — Anesthesia Postprocedure Evaluation (Signed)
Anesthesia Post Note  Patient: Jeanette Harris  Procedure(s) Performed: KNEE ARTHROSCOPY PARTIAL AND LATERAL MENISCECTOMY AND CHONDROPLASTY (Left )  Patient location during evaluation: PACU Anesthesia Type: General Level of consciousness: awake and alert Pain management: pain level controlled Vital Signs Assessment: post-procedure vital signs reviewed and stable Respiratory status: spontaneous breathing, nonlabored ventilation, respiratory function stable and patient connected to nasal cannula oxygen Cardiovascular status: blood pressure returned to baseline and stable Postop Assessment: no apparent nausea or vomiting Anesthetic complications: no    Daisy Lites C

## 2017-10-01 NOTE — Discharge Instructions (Signed)
Arthroscopic Knee Surgery - Partial Meniscectomy   Post-Op Instructions   1. Bracing or crutches: Crutches will be provided at the time of discharge from the surgery center if you do not already have them.   2. Ice: You may be provided with a device [facility]) that allows you to ice the affected area effectively. Otherwise you can ice manually.    3. Driving:  Plan on not driving for at least 1-2 weeks. Please note that you are advised NOT to drive while taking narcotic pain medications as you may be impaired and unsafe to drive.   4. Activity: Ankle pumps several times an hour while awake to prevent blood clots. Weight bearing: as tolerated. Use crutches/walker for as needed (usually ~1 week or less) until pain allows you to ambulate without a limp. Bending and straightening the knee is unlimited. Elevate knee above heart level as much as possible for one week. Avoid standing more than 5 minutes (consecutively) for the first week.  Avoid long distance travel for 2 weeks.  5. Medications:  - You have been provided a prescription for narcotic pain medicine. After surgery, take 1-2 narcotic tablets every 4 hours if needed for severe pain.  - You may take up to 3000mg /day of tylenol (acetaminophen). You can take 1000mg  3x/day. Please check your narcotic. If you have acetaminophen in your narcotic (each tablet will be 325mg ), be careful not to exceed a total of 3000mg /day of acetaminophen.  - A prescription for anti-nausea medication will be provided in case the narcotic medicine or anesthesia causes nausea - take 1 tablet every 6 hours only if nauseated.  - Take Eliquis 2.5mg  twice/day for 2 weeks to prevent blood clots. Start taking this the day after surgery.   6. Bandages: The physical therapist should change the bandages at the first post-op appointment. If needed, the dressing supplies have been provided to you.   7. Physical Therapy: 1-2 times per week for 6 weeks. Therapy typically starts  on post operative Day 3 or 4. You have been provided an order for physical therapy. The therapist will provide home exercises.   8. Work: May return to full work usually around 2 weeks after 1st post-operative visit. May do light duty/desk job in approximately 1-2 weeks when off of narcotics, pain is well-controlled, and swelling has decreased. Labor intensive jobs may require 4-6 weeks to return.    9. Post-Op Appointments: Your first post-op appointment will be with Dr. Posey Pronto in approximately 2 weeks time.    If you find that they have not been scheduled please call the Orthopaedic Appointment front desk at [phone].     General Anesthesia, Adult, Care After These instructions provide you with information about caring for yourself after your procedure. Your health care provider may also give you more specific instructions. Your treatment has been planned according to current medical practices, but problems sometimes occur. Call your health care provider if you have any problems or questions after your procedure. What can I expect after the procedure? After the procedure, it is common to have:  Vomiting.  A sore throat.  Mental slowness.  It is common to feel:  Nauseous.  Cold or shivery.  Sleepy.  Tired.  Sore or achy, even in parts of your body where you did not have surgery.  Follow these instructions at home: For at least 24 hours after the procedure:  Do not: ? Participate in activities where you could fall or become injured. ? Drive. ? Use  heavy machinery. ? Drink alcohol. ? Take sleeping pills or medicines that cause drowsiness. ? Make important decisions or sign legal documents. ? Take care of children on your own.  Rest. Eating and drinking  If you vomit, drink water, juice, or soup when you can drink without vomiting.  Drink enough fluid to keep your urine clear or pale yellow.  Make sure you have little or no nausea before eating solid  foods.  Follow the diet recommended by your health care provider. General instructions  Have a responsible adult stay with you until you are awake and alert.  Return to your normal activities as told by your health care provider. Ask your health care provider what activities are safe for you.  Take over-the-counter and prescription medicines only as told by your health care provider.  If you smoke, do not smoke without supervision.  Keep all follow-up visits as told by your health care provider. This is important. Contact a health care provider if:  You continue to have nausea or vomiting at home, and medicines are not helpful.  You cannot drink fluids or start eating again.  You cannot urinate after 8-12 hours.  You develop a skin rash.  You have fever.  You have increasing redness at the site of your procedure. Get help right away if:  You have difficulty breathing.  You have chest pain.  You have unexpected bleeding.  You feel that you are having a life-threatening or urgent problem. This information is not intended to replace advice given to you by your health care provider. Make sure you discuss any questions you have with your health care provider. Document Released: 06/02/2000 Document Revised: 07/30/2015 Document Reviewed: 02/08/2015 Elsevier Interactive Patient Education  [employer].

## 2017-10-02 ENCOUNTER — Encounter: Payer: Self-pay | Admitting: Orthopedic Surgery

## 2017-12-23 IMAGING — US US PELVIS COMPLETE TRANSABD/TRANSVAG
1 series · 14 of 25 positions shown · non-contrast
Comparison: No recent studies in PACs

CLINICAL DATA: Right lower quadrant fullness. History of
hysterectomy at age 25

EXAM:
TRANSABDOMINAL AND TRANSVAGINAL ULTRASOUND OF PELVIS
TECHNIQUE: Both transabdominal and transvaginal ultrasound examinations of the
pelvis were performed. Transabdominal technique was performed for
global imaging of the pelvis including uterus, ovaries, adnexal
regions, and pelvic cul-de-sac. It was necessary to proceed with
endovaginal exam following the transabdominal exam to visualize the
ovaries and adnexal structures..

[Series 1: us pelvis complete transabd/transvag · 0.24mm/px · 14 of 58 slices shown]
[im 1/58]
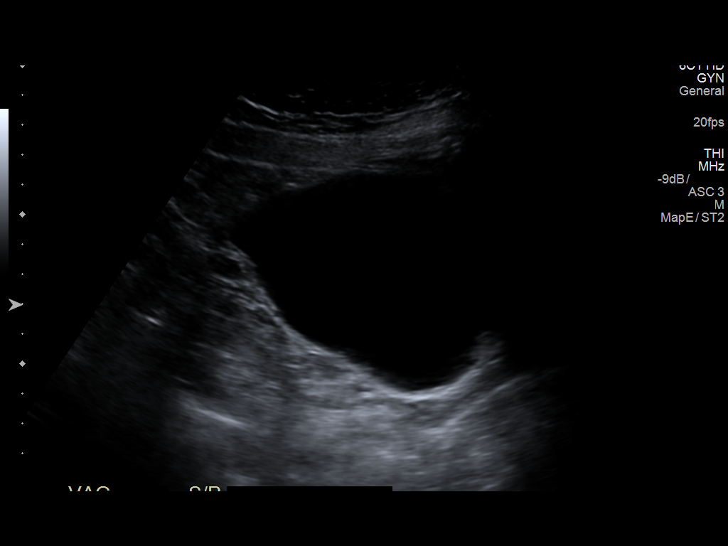
[im 5/58]
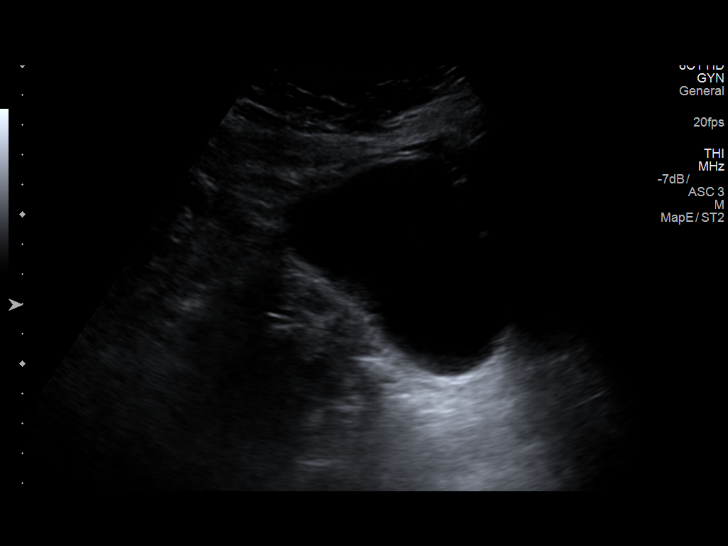
[im 10/58]
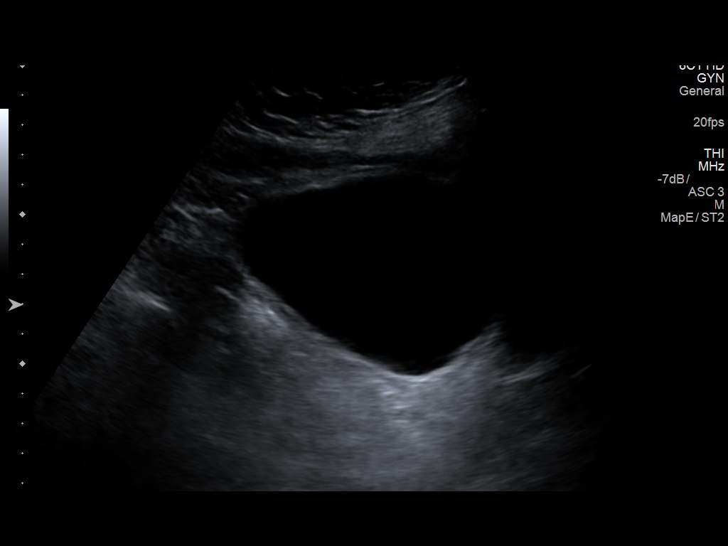
[im 15/58]
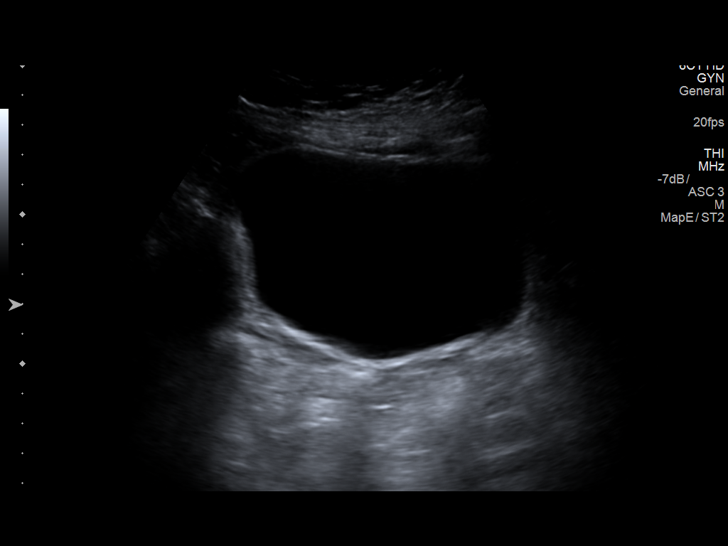
[im 20/58]
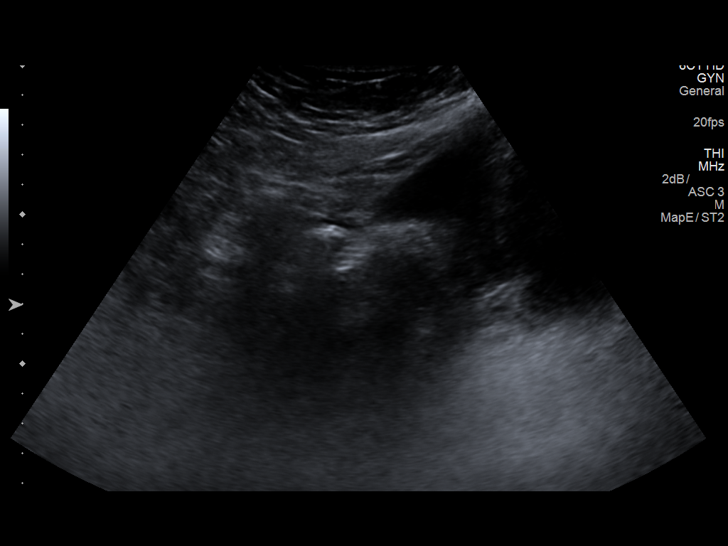
[im 22/58]
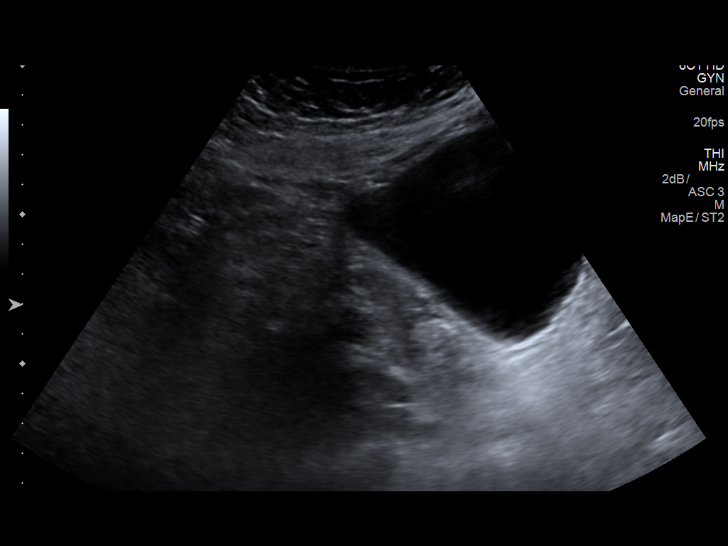
[im 27/58]
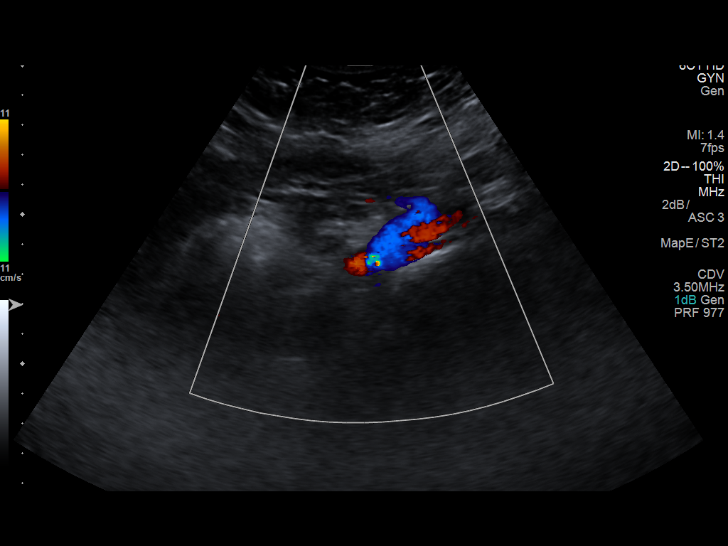
[im 31/58]
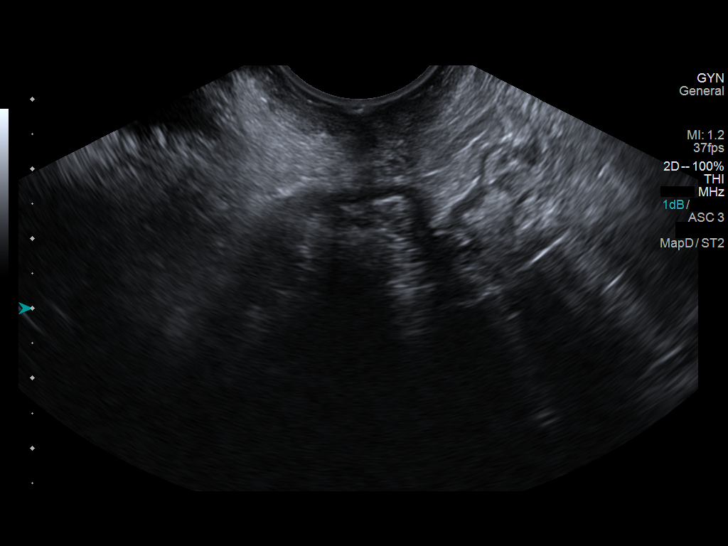
[im 36/58]
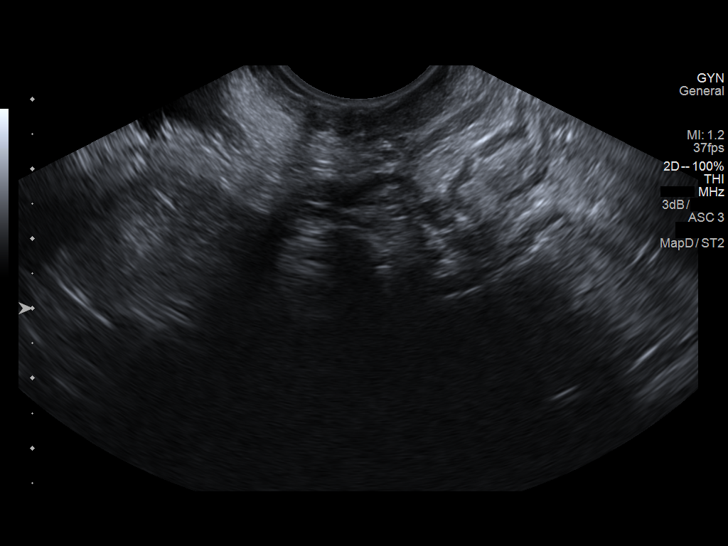
[im 39/58]
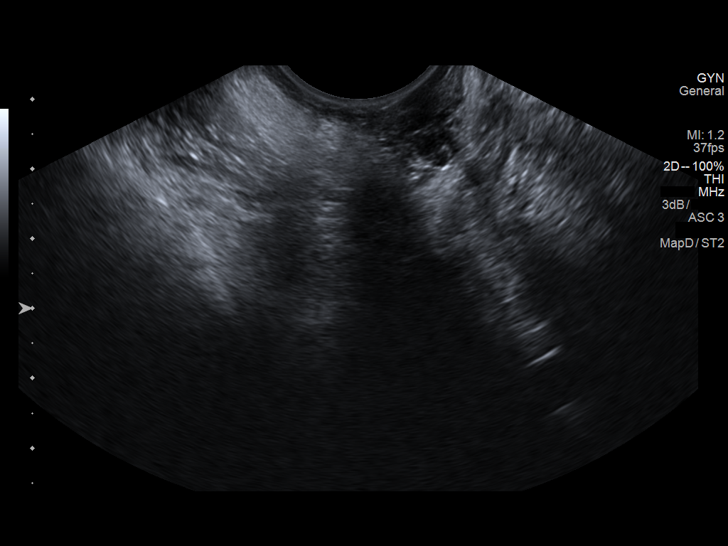
[im 43/58]
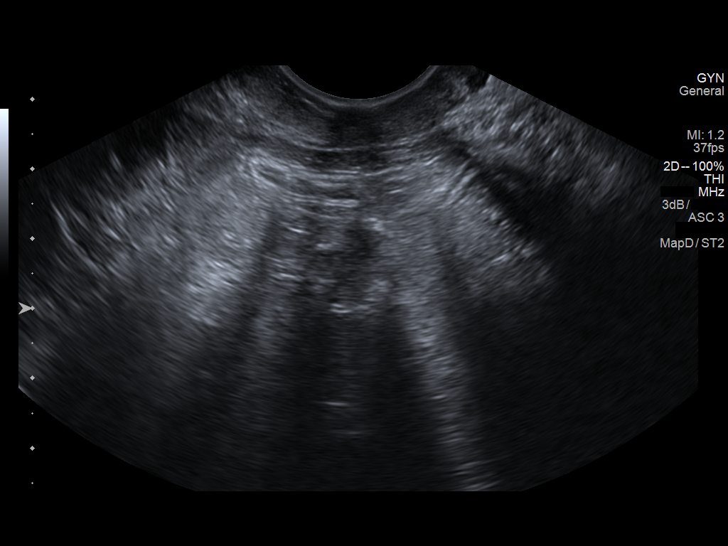
[im 48/58]
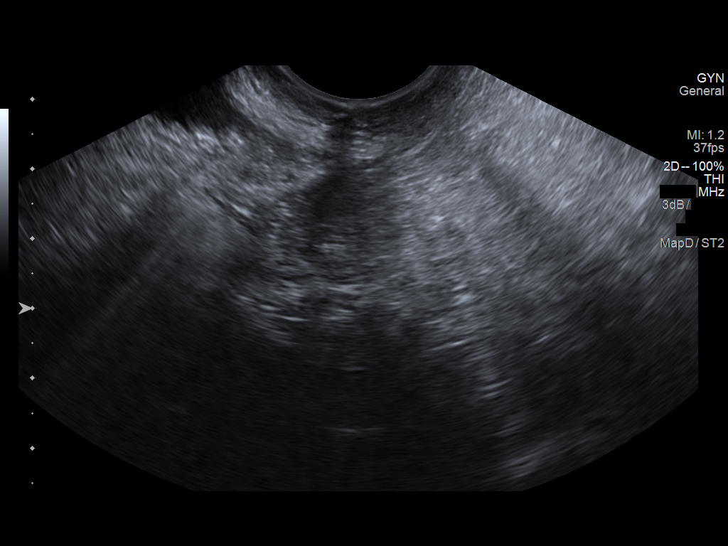
[im 53/58]
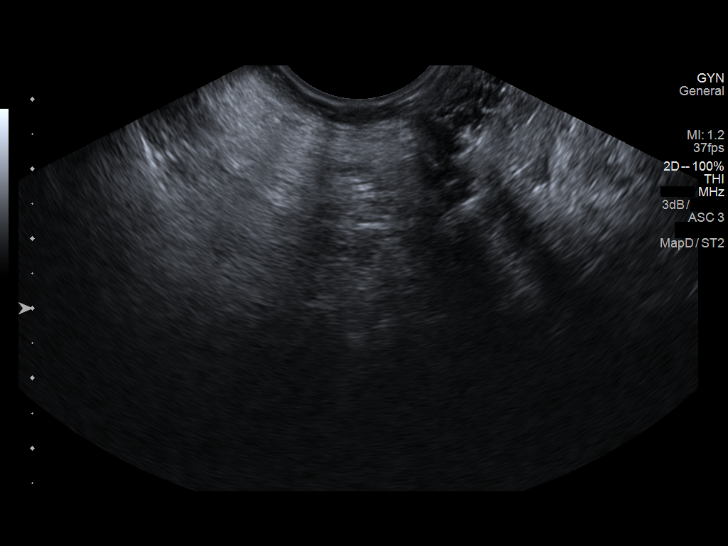
[im 58/58]
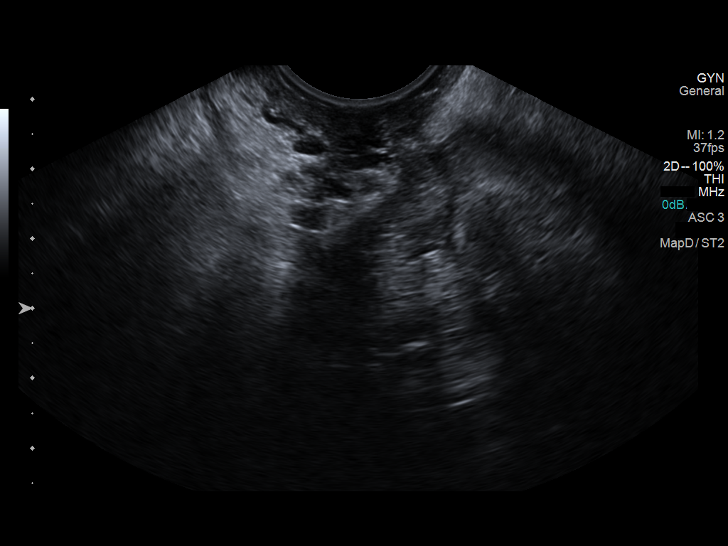

[14 of 25 positions shown; findings below may reference images not displayed]

FINDINGS: Uterus

The uterus is surgically absent.

Right ovary

The right ovary could not be visualized.

Left ovary

The left ovary could not be visualized.

Other findings

No abnormal free fluid.
IMPRESSION: Nonvisualization of the ovaries. The uterus is surgically absent. No
free pelvic fluid. No adnexal mass was observed.

## 2018-02-08 ENCOUNTER — Other Ambulatory Visit: Payer: Self-pay | Admitting: Physician Assistant

## 2018-02-08 DIAGNOSIS — Z1231 Encounter for screening mammogram for malignant neoplasm of breast: Secondary | ICD-10-CM

## 2018-02-15 ENCOUNTER — Ambulatory Visit
Admission: RE | Admit: 2018-02-15 | Discharge: 2018-02-15 | Disposition: A | Discharge status: Home / Self Care | Payer: Medicare Other | Source: Ambulatory Visit | Attending: Physician Assistant | Admitting: Physician Assistant

## 2018-02-15 DIAGNOSIS — Z1231 Encounter for screening mammogram for malignant neoplasm of breast: Secondary | ICD-10-CM | POA: Diagnosis not present

## 2018-03-24 IMAGING — US US EXTREM LOW VENOUS BILAT
1 series · 13 of 24 positions shown · non-contrast
Comparison: 04/01/2017

CLINICAL DATA: Bilateral leg pain.  Recent left leg DVT.



[Series 1: us extrem low venous bilat · 0.05mm/px · 13 of 62 slices shown]
[im 1/62]
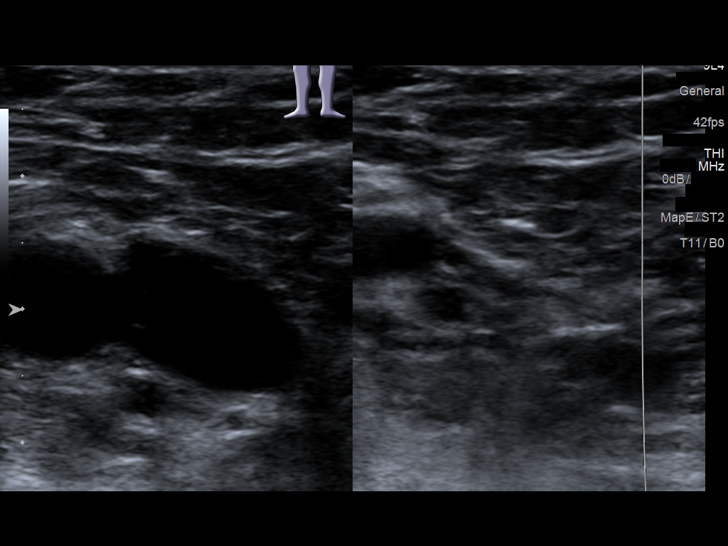
[im 6/62]
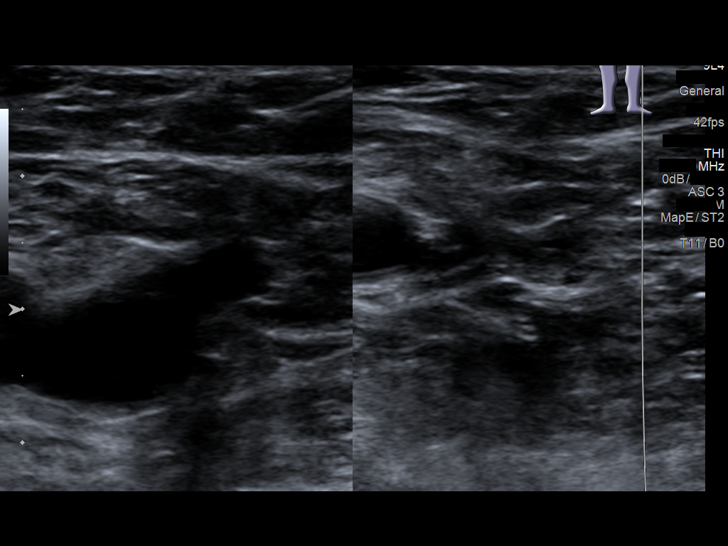
[im 11/62]
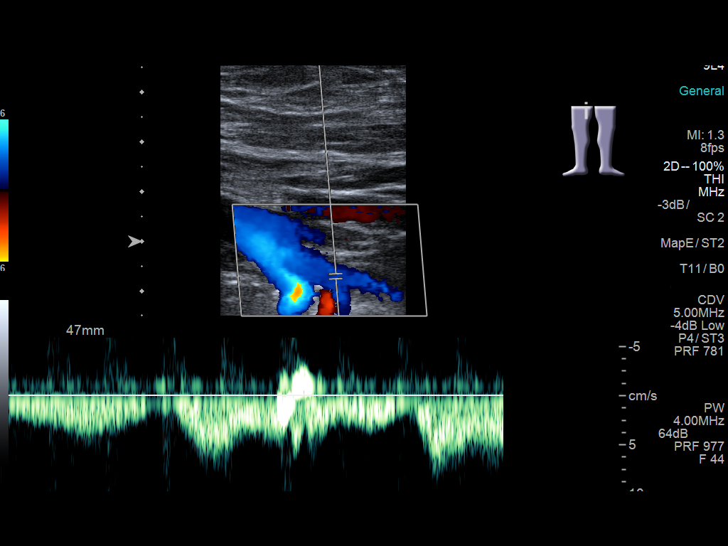
[im 16/62]
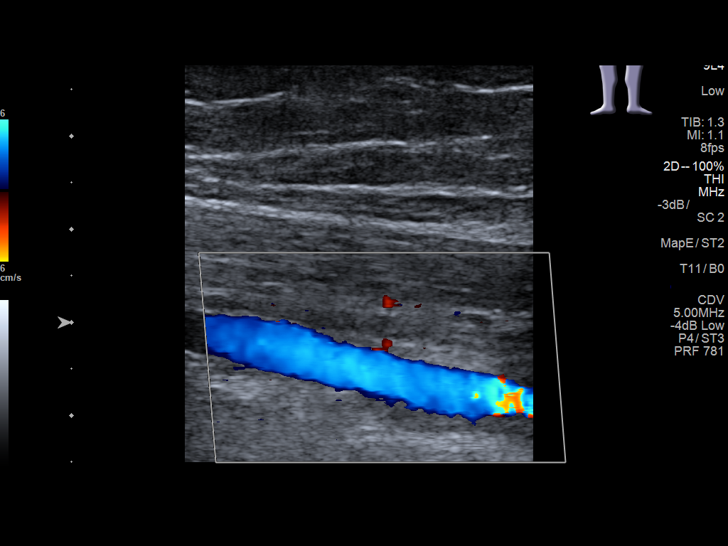
[im 22/62]
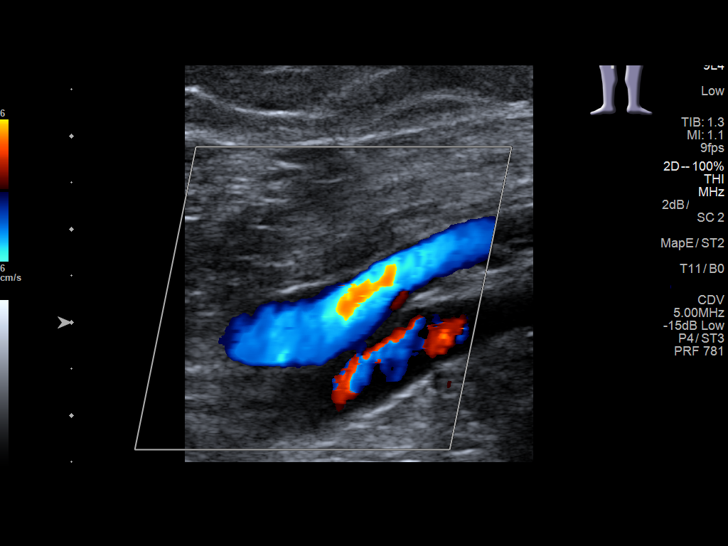
[im 27/62]
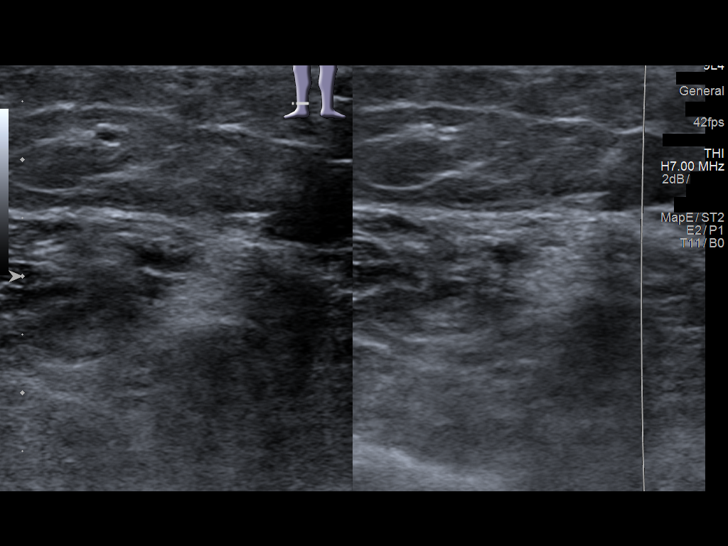
[im 32/62]
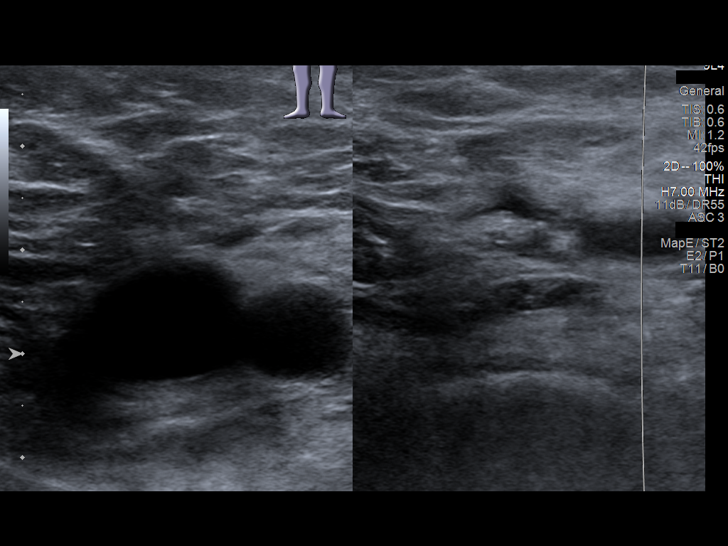
[im 35/62]
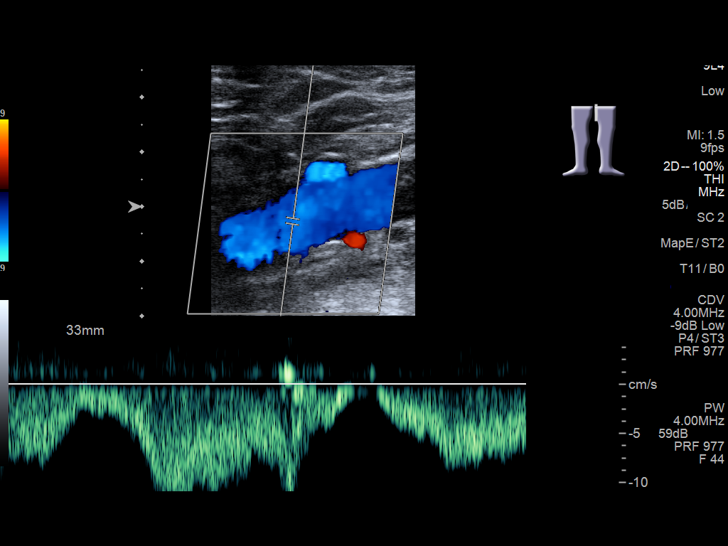
[im 40/62]
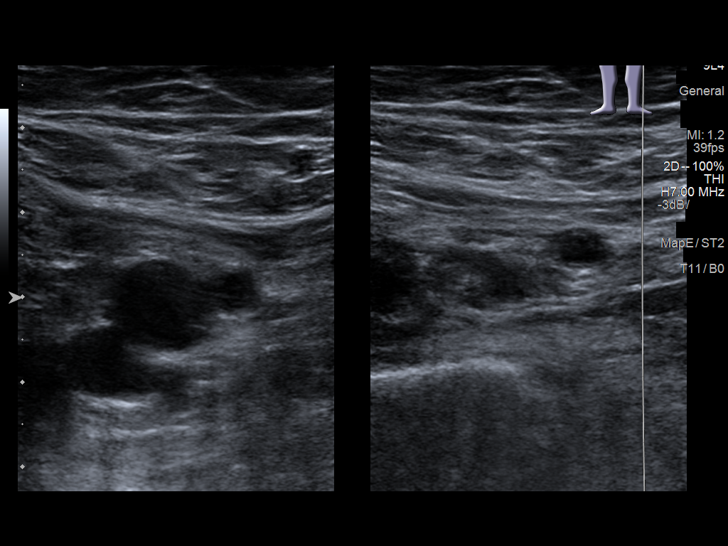
[im 46/62]
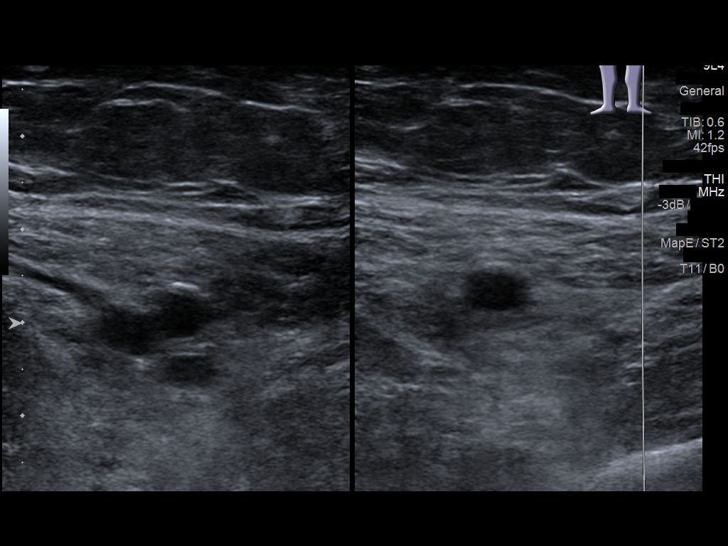
[im 51/62]
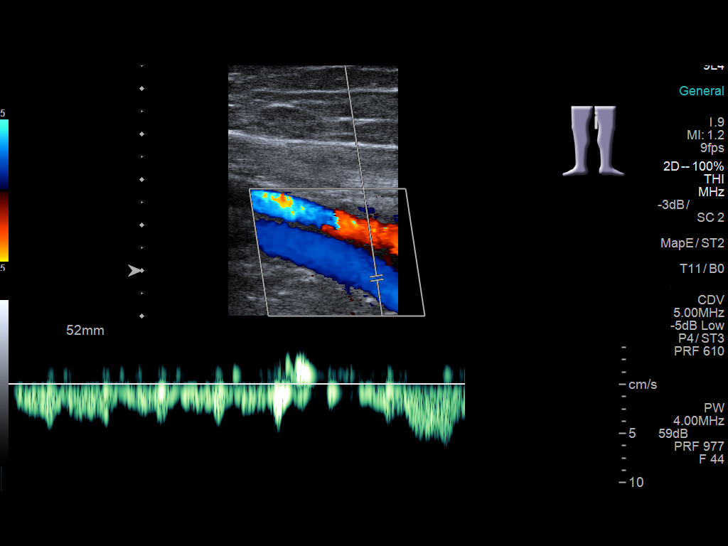
[im 56/62]
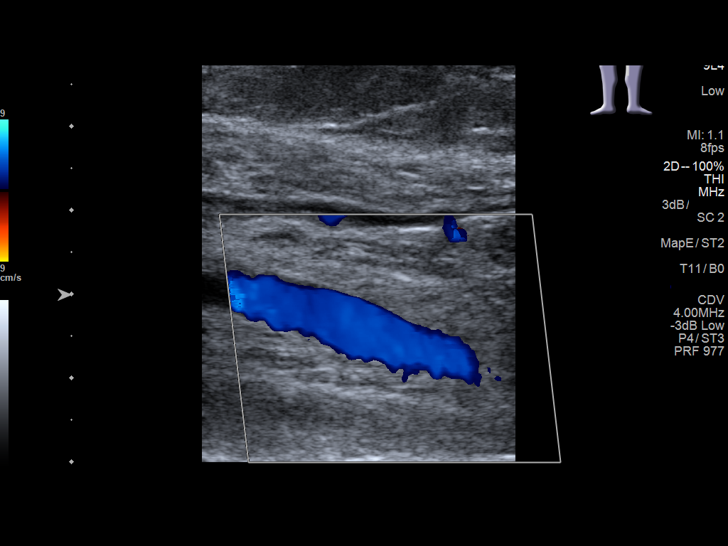
[im 62/62]
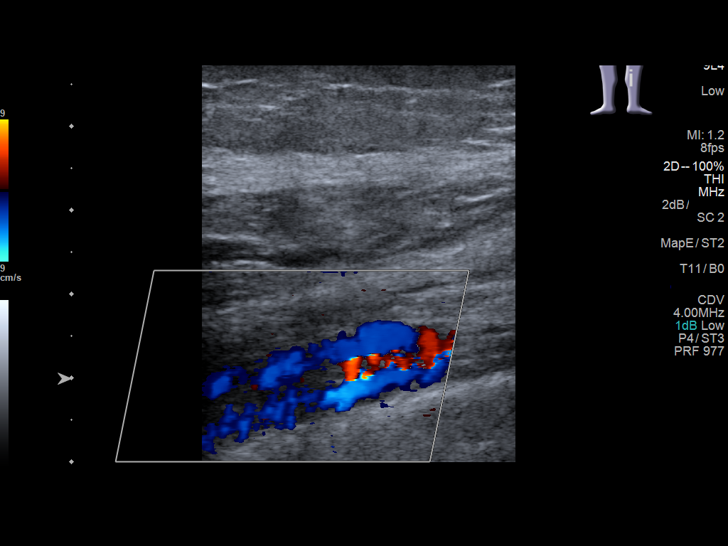

[13 of 24 positions shown; findings below may reference images not displayed]

FINDINGS: RIGHT LOWER EXTREMITY

Common Femoral Vein: No evidence of thrombus. Normal
compressibility, respiratory phasicity and response to augmentation.

Saphenofemoral Junction: No evidence of thrombus. Normal
compressibility and flow on color Doppler imaging.

Profunda Femoral Vein: No evidence of thrombus. Normal
compressibility and flow on color Doppler imaging.

Femoral Vein: No evidence of thrombus. Normal compressibility,
respiratory phasicity and response to augmentation.

Popliteal Vein: No evidence of thrombus. Normal compressibility,
respiratory phasicity and response to augmentation.

Calf Veins: No evidence of thrombus. Normal compressibility and flow
on color Doppler imaging.

LEFT LOWER EXTREMITY

Common Femoral Vein: No evidence of thrombus. Normal
compressibility, respiratory phasicity and response to augmentation.

Saphenofemoral Junction: No evidence of thrombus. Normal
compressibility and flow on color Doppler imaging.

Profunda Femoral Vein: No evidence of thrombus. Normal
compressibility and flow on color Doppler imaging.

Femoral Vein: No evidence of thrombus. Normal compressibility,
respiratory phasicity and response to augmentation.

Popliteal Vein: No evidence of thrombus. Normal compressibility,
respiratory phasicity and response to augmentation.

Calf Veins: No evidence of thrombus. Normal compressibility and flow
on color Doppler imaging.
IMPRESSION: No evidence of deep venous thrombosis in the lower extremities. Left
lower extremity DVT has resolved from the prior examination.

## 2018-03-31 DIAGNOSIS — K625 Hemorrhage of anus and rectum: Secondary | ICD-10-CM | POA: Insufficient documentation

## 2018-04-19 ENCOUNTER — Encounter: Payer: Self-pay | Admitting: Anesthesiology

## 2018-04-19 ENCOUNTER — Ambulatory Visit: Payer: Medicare Other | Admitting: Anesthesiology

## 2018-04-19 ENCOUNTER — Ambulatory Visit
Admission: RE | Admit: 2018-04-19 | Discharge: 2018-04-19 | Disposition: A | Discharge status: Home / Self Care | Payer: Medicare Other | Attending: Unknown Physician Specialty | Admitting: Unknown Physician Specialty

## 2018-04-19 ENCOUNTER — Encounter
Admission: RE | Disposition: A | Discharge status: Home / Self Care | Payer: Self-pay | Source: Home / Self Care | Attending: Unknown Physician Specialty

## 2018-04-19 DIAGNOSIS — Z86010 Personal history of colonic polyps: Secondary | ICD-10-CM | POA: Insufficient documentation

## 2018-04-19 DIAGNOSIS — J449 Chronic obstructive pulmonary disease, unspecified: Secondary | ICD-10-CM | POA: Insufficient documentation

## 2018-04-19 DIAGNOSIS — K625 Hemorrhage of anus and rectum: Secondary | ICD-10-CM | POA: Insufficient documentation

## 2018-04-19 DIAGNOSIS — Z87891 Personal history of nicotine dependence: Secondary | ICD-10-CM | POA: Diagnosis not present

## 2018-04-19 DIAGNOSIS — Z7901 Long term (current) use of anticoagulants: Secondary | ICD-10-CM | POA: Diagnosis not present

## 2018-04-19 DIAGNOSIS — D127 Benign neoplasm of rectosigmoid junction: Secondary | ICD-10-CM | POA: Insufficient documentation

## 2018-04-19 DIAGNOSIS — Z79899 Other long term (current) drug therapy: Secondary | ICD-10-CM | POA: Diagnosis not present

## 2018-04-19 DIAGNOSIS — K640 First degree hemorrhoids: Secondary | ICD-10-CM | POA: Insufficient documentation

## 2018-04-19 DIAGNOSIS — M1990 Unspecified osteoarthritis, unspecified site: Secondary | ICD-10-CM | POA: Insufficient documentation

## 2018-04-19 DIAGNOSIS — I739 Peripheral vascular disease, unspecified: Secondary | ICD-10-CM | POA: Diagnosis not present

## 2018-04-19 DIAGNOSIS — Z86718 Personal history of other venous thrombosis and embolism: Secondary | ICD-10-CM | POA: Insufficient documentation

## 2018-04-19 DIAGNOSIS — G8929 Other chronic pain: Secondary | ICD-10-CM | POA: Diagnosis not present

## 2018-04-19 DIAGNOSIS — R109 Unspecified abdominal pain: Secondary | ICD-10-CM | POA: Diagnosis not present

## 2018-04-19 DIAGNOSIS — K219 Gastro-esophageal reflux disease without esophagitis: Secondary | ICD-10-CM | POA: Diagnosis not present

## 2018-04-19 DIAGNOSIS — K2970 Gastritis, unspecified, without bleeding: Secondary | ICD-10-CM | POA: Diagnosis not present

## 2018-04-19 HISTORY — PX: ESOPHAGOGASTRODUODENOSCOPY (EGD) WITH PROPOFOL: SHX5813

## 2018-04-19 HISTORY — PX: COLONOSCOPY WITH PROPOFOL: SHX5780

## 2018-04-19 SURGERY — COLONOSCOPY WITH PROPOFOL
Anesthesia: General

## 2018-04-19 MED ORDER — LIDOCAINE HCL (PF) 1 % IJ SOLN
2.0000 mL | Freq: Once | INTRAMUSCULAR | Status: AC
  Administered 2018-04-19: 0.3 mL via INTRADERMAL

## 2018-04-19 MED ORDER — PHENYLEPHRINE HCL 10 MG/ML IJ SOLN
INTRAMUSCULAR | Status: DC | PRN
  Administered 2018-04-19: 200 ug via INTRAVENOUS
  Administered 2018-04-19: 300 ug via INTRAVENOUS
  Administered 2018-04-19: 200 ug via INTRAVENOUS
  Administered 2018-04-19: 100 ug via INTRAVENOUS
  Administered 2018-04-19 (×2): 200 ug via INTRAVENOUS
  Administered 2018-04-19: 300 ug via INTRAVENOUS
  Administered 2018-04-19: 200 ug via INTRAVENOUS

## 2018-04-19 MED ORDER — MIDAZOLAM HCL 5 MG/5ML IJ SOLN
INTRAMUSCULAR | Status: DC | PRN
  Administered 2018-04-19: 2 mg via INTRAVENOUS

## 2018-04-19 MED ORDER — MIDAZOLAM HCL 2 MG/2ML IJ SOLN
INTRAMUSCULAR | Status: AC
  Filled 2018-04-19: qty 2

## 2018-04-19 MED ORDER — LIDOCAINE HCL (PF) 1 % IJ SOLN
INTRAMUSCULAR | Status: AC
  Administered 2018-04-19: 0.3 mL via INTRADERMAL
  Filled 2018-04-19: qty 2

## 2018-04-19 MED ORDER — FENTANYL CITRATE (PF) 100 MCG/2ML IJ SOLN
INTRAMUSCULAR | Status: AC
  Filled 2018-04-19: qty 2

## 2018-04-19 MED ORDER — LIDOCAINE HCL (PF) 2 % IJ SOLN
INTRAMUSCULAR | Status: AC
  Filled 2018-04-19: qty 10

## 2018-04-19 MED ORDER — ONDANSETRON HCL 4 MG/2ML IJ SOLN
INTRAMUSCULAR | Status: DC | PRN
  Administered 2018-04-19: 4 mg via INTRAVENOUS

## 2018-04-19 MED ORDER — SODIUM CHLORIDE 0.9 % IV SOLN: INTRAVENOUS | Status: DC

## 2018-04-19 MED ORDER — FENTANYL CITRATE (PF) 100 MCG/2ML IJ SOLN
INTRAMUSCULAR | Status: DC | PRN
  Administered 2018-04-19 (×2): 50 ug via INTRAVENOUS

## 2018-04-19 MED ORDER — DEXAMETHASONE SODIUM PHOSPHATE 10 MG/ML IJ SOLN
INTRAMUSCULAR | Status: DC | PRN
  Administered 2018-04-19: 8 mg via INTRAVENOUS

## 2018-04-19 MED ORDER — PROPOFOL 500 MG/50ML IV EMUL
INTRAVENOUS | Status: DC | PRN
  Administered 2018-04-19: 50 ug/kg/min via INTRAVENOUS

## 2018-04-19 MED ORDER — LIDOCAINE HCL (PF) 2 % IJ SOLN
INTRAMUSCULAR | Status: DC | PRN
  Administered 2018-04-19: 80 mg

## 2018-04-19 MED ORDER — DEXAMETHASONE SODIUM PHOSPHATE 10 MG/ML IJ SOLN
INTRAMUSCULAR | Status: AC
  Filled 2018-04-19: qty 1

## 2018-04-19 MED ORDER — PROPOFOL 10 MG/ML IV BOLUS
INTRAVENOUS | Status: DC | PRN
  Administered 2018-04-19: 10 mg via INTRAVENOUS
  Administered 2018-04-19 (×2): 20 mg via INTRAVENOUS

## 2018-04-19 MED ORDER — PROPOFOL 500 MG/50ML IV EMUL
INTRAVENOUS | Status: AC
  Filled 2018-04-19: qty 50

## 2018-04-19 MED ORDER — ONDANSETRON HCL 4 MG/2ML IJ SOLN
INTRAMUSCULAR | Status: AC
  Filled 2018-04-19: qty 2

## 2018-04-19 MED ORDER — SODIUM CHLORIDE 0.9 % IV SOLN
INTRAVENOUS | Status: DC
  Administered 2018-04-19: 1000 mL via INTRAVENOUS

## 2018-04-19 NOTE — Anesthesia Post-op Follow-up Note (Signed)
Anesthesia QCDR form completed.        

## 2018-04-19 NOTE — Op Note (Signed)
[facility] Gastroenterology Patient Name: Jeanette Harris Procedure Date: 04/19/2018 10:32 AM MRN: [id number] Account #: [id number] Date of Birth: 06/01/1945 Admit Type: Outpatient Age: 73 Room: [facility] ENDO ROOM 1 Gender: Female Note Status: Finalized Procedure:            Upper GI endoscopy Indications:          Epigastric abdominal pain, Generalized abdominal pain Providers:            Manya Silvas, MD Referring MD:         Precious Bard, MD (Referring MD) Medicines:            Propofol per Anesthesia Complications:        No immediate complications. Procedure:            Pre-Anesthesia Assessment:                       - After reviewing the risks and benefits, the patient                        was deemed in satisfactory condition to undergo the                        procedure.                       After obtaining informed consent, the endoscope was                        passed under direct vision. Throughout the procedure,                        the patient's blood pressure, pulse, and oxygen                        saturations were monitored continuously. The Endoscope                        was introduced through the mouth, and advanced to the                        second part of duodenum. The upper GI endoscopy was                        accomplished without difficulty. The patient tolerated                        the procedure well. Findings:      The examined esophagus was normal.      Patchy moderate inflammation characterized by erythema and granularity       was found in the gastric body. Biopsies were taken with a cold forceps       for histology. Biopsies were taken with a cold forceps for Helicobacter       pylori testing.      Patchy mild inflammation characterized by erythema was found in the       gastric antrum. Impression:           - Normal esophagus.                       - Gastritis. Biopsied.                       -  Gastritis. Recommendation:       - Await pathology results. Manya Silvas, MD 04/19/2018 10:49:24 AM This report has been signed electronically. Number of Addenda: 0 Note Initiated On: 04/19/2018 10:32 AM      [facility]

## 2018-04-19 NOTE — Anesthesia Preprocedure Evaluation (Addendum)
Anesthesia Evaluation    History of Anesthesia Complications (+) PONV and history of anesthetic complications  Airway Mallampati: I  TM Distance: >3 FB Neck ROM: Full    Dental  (+) Dental Advidsory Given, Teeth Intact, Caps, Missing   Pulmonary neg shortness of breath, neg sleep apnea, neg COPD, neg recent URI, former smoker,  Denies COPD          Cardiovascular (-) hypertension(-) angina+ Peripheral Vascular Disease  (-) CAD, (-) Past MI, (-) Cardiac Stents and (-) CABG negative cardio ROS  (-) dysrhythmias + Valvular Problems/Murmurs   Known heart murmur, no symptoms. Excellent functional capacity   Neuro/Psych  Headaches, neg Seizures negative psych ROS   GI/Hepatic Neg liver ROS, GERD  Controlled,  Endo/Other  negative endocrine ROS  Renal/GU negative Renal ROS     Musculoskeletal  (+) Arthritis ,   Abdominal   Peds  Hematology   Anesthesia Other Findings Past Medical History: No date: Arthritis No date: Complication of anesthesia No date: COPD (chronic obstructive pulmonary disease) (HCC)     Comment:  patient denies 04/01/2017: DVT (deep venous thrombosis) (HCC)     Comment:  left lower leg No date: GERD (gastroesophageal reflux disease) No date: Headache No date: Heart murmur No date: Peripheral vascular disease (HCC)     Comment:  phlebitis No date: Phlebitis No date: PONV (postoperative nausea and vomiting)   Reproductive/Obstetrics negative OB ROS                            Anesthesia Physical  Anesthesia Plan  ASA: II  Anesthesia Plan: General   Post-op Pain Management:    Induction: Intravenous  PONV Risk Score and Plan: 4 or greater and Propofol infusion and TIVA  Airway Management Planned: Natural Airway and Nasal Cannula  Additional Equipment:   Intra-op Plan:   Post-operative Plan:   Informed Consent: I have reviewed the patients History and Physical,  chart, labs and discussed the procedure including the risks, benefits and alternatives for the proposed anesthesia with the patient or authorized representative who has indicated his/her understanding and acceptance.     Dental advisory given  Plan Discussed with: CRNA  Anesthesia Plan Comments:         Anesthesia Quick Evaluation

## 2018-04-19 NOTE — Anesthesia Postprocedure Evaluation (Signed)
Anesthesia Post Note  Patient: CARRYE GOLLER  Procedure(s) Performed: COLONOSCOPY WITH PROPOFOL (N/A ) ESOPHAGOGASTRODUODENOSCOPY (EGD) WITH PROPOFOL (N/A )  Patient location during evaluation: Endoscopy Anesthesia Type: General Level of consciousness: awake and alert and oriented Pain management: pain level controlled Vital Signs Assessment: post-procedure vital signs reviewed and stable Respiratory status: spontaneous breathing, nonlabored ventilation and respiratory function stable Cardiovascular status: blood pressure returned to baseline and stable Postop Assessment: no signs of nausea or vomiting Anesthetic complications: no     Last Vitals:  Vitals:   04/19/18 1119 04/19/18 1159  BP: (!) 99/50 112/60  Pulse:    Resp: 10   Temp: (!) 36.1 C   SpO2: 100%     Last Pain:  Vitals:   04/19/18 1124  TempSrc:   PainSc: 0-No pain                 Torunn Chancellor

## 2018-04-19 NOTE — H&P (Signed)
Primary Care Physician:  Marinda Elk, MD Primary Gastroenterologist:  Dr. Vira Agar  Pre-Procedure History & Physical: HPI:  Jeanette Harris is a 73 y.o. female is here for an endoscopy and colonoscopy.   Past Medical History:  Diagnosis Date  . Arthritis   . Complication of anesthesia   . COPD (chronic obstructive pulmonary disease) ([place])    patient denies  . DVT (deep venous thrombosis) ([place]) 04/01/2017   left lower leg  . GERD (gastroesophageal reflux disease)   . Headache   . Heart murmur   . Peripheral vascular disease (HCC)    phlebitis  . Phlebitis   . PONV (postoperative nausea and vomiting)     Past Surgical History:  Procedure Laterality Date  . ABDOMINAL HYSTERECTOMY    . ANKLE SURGERY    . APPENDECTOMY    . BACK SURGERY    . CATARACT EXTRACTION W/PHACO Left 02/13/2016   Procedure: CATARACT EXTRACTION PHACO AND INTRAOCULAR LENS PLACEMENT (IOC);  Surgeon: Leandrew Koyanagi, MD;  Location: [place];  Service: Ophthalmology;  Laterality: Left;  TORIC  . CATARACT EXTRACTION W/PHACO Right 02/27/2016   Procedure: CATARACT EXTRACTION PHACO AND INTRAOCULAR LENS PLACEMENT (IOC);  Surgeon: Leandrew Koyanagi, MD;  Location: [place];  Service: Ophthalmology;  Laterality: Right;  TORIC Latex sensitivity  . CHOLECYSTECTOMY    . COLONOSCOPY    . ESOPHAGOGASTRODUODENOSCOPY    . FOOT SURGERY    . KNEE ARTHROSCOPY WITH MENISCAL REPAIR Left 10/01/2017   Procedure: KNEE ARTHROSCOPY PARTIAL AND LATERAL MENISCECTOMY AND CHONDROPLASTY;  Surgeon: Leim Fabry, MD;  Location: [place];  Service: Orthopedics;  Laterality: Left;  . SHOULDER ARTHROSCOPY      Prior to Admission medications   Medication Sig Start Date End Date Taking? Authorizing Provider  acetaminophen (TYLENOL) 500 MG tablet Take 2 tablets (1,000 mg total) by mouth every 8 (eight) hours. 10/01/17 10/01/18  Leim Fabry, MD  apixaban (ELIQUIS) 5 MG TABS tablet Take 1 tablet (5  mg total) by mouth 2 (two) times daily for 14 days. 10/02/17 10/16/17  Leim Fabry, MD  calcium-vitamin D (OSCAL WITH D) 500-200 MG-UNIT tablet Take 1 tablet by mouth.    [provider]  Cyanocobalamin (B-12 COMPLIANCE INJECTION IJ) Inject as directed.    [provider]  docusate sodium (COLACE) 250 MG capsule Take 250 mg by mouth daily.    [provider]  lansoprazole (PREVACID) 30 MG capsule Take 30 mg by mouth daily at 12 noon.    [provider]  Multiple Vitamin (MULTIVITAMIN) capsule Take 1 capsule by mouth daily.    [provider]  nortriptyline (PAMELOR) 75 MG capsule Take 75 mg by mouth at bedtime.    [provider]  ondansetron (ZOFRAN ODT) 4 MG disintegrating tablet Take 1 tablet (4 mg total) by mouth every 8 (eight) hours as needed for nausea or vomiting. 10/01/17   Leim Fabry, MD  oxyCODONE (ROXICODONE) 5 MG immediate release tablet Take 1-2 tablets (5-10 mg total) by mouth every 4 (four) hours as needed (pain). 10/01/17 10/01/18  Leim Fabry, MD  rOPINIRole (REQUIP) 0.5 MG tablet Take 0.5 mg by mouth at bedtime.    [provider]  sucralfate (CARAFATE) 1 g tablet Take 1 g by mouth 4 (four) times daily -  with meals and at bedtime.    [provider]  topiramate (TOPAMAX) 50 MG tablet Take 50 mg by mouth 2 (two) times daily.    [provider]  Allergies as of 04/08/2018 - Review Complete 10/01/2017  Allergen Reaction Noted  . Aspirin Other (See Comments) 07/07/2015  . Ivp dye [iodinated diagnostic agents] Nausea And Vomiting 02/12/2015  . Nsaids Other (See Comments) 07/07/2015  . Latex Rash 02/12/2015    Family History  Problem Relation Age of Onset  . Breast cancer Maternal Aunt     Social History   Socioeconomic History  . Marital status: Married    Spouse name: Not on file  . Number of children: Not on file  . Years of education: Not on file  . Highest education level: Not on file   Occupational History  . Not on file  Social Needs  . Financial resource strain: Not on file  . Food insecurity:    Worry: Not on file    Inability: Not on file  . Transportation needs:    Medical: Not on file    Non-medical: Not on file  Tobacco Use  . Smoking status: Former Research scientist (life sciences)  . Smokeless tobacco: Never Used  . Tobacco comment: quit in the 1980s  Substance and Sexual Activity  . Alcohol use: No  . Drug use: No  . Sexual activity: Not on file  Lifestyle  . Physical activity:    Days per week: Not on file    Minutes per session: Not on file  . Stress: Not on file  Relationships  . Social connections:    Talks on phone: Not on file    Gets together: Not on file    Attends religious service: Not on file    Active member of club or organization: Not on file    Attends meetings of clubs or organizations: Not on file    Relationship status: Not on file  . Intimate partner violence:    Fear of current or ex partner: Not on file    Emotionally abused: Not on file    Physically abused: Not on file    Forced sexual activity: Not on file  Other Topics Concern  . Not on file  Social History Narrative  . Not on file    Review of Systems: See HPI, otherwise negative ROS  Physical Exam: BP 109/70   Pulse 96   Temp (!) 96.7 F (35.9 C) (Tympanic)   Resp 16   Ht 5\' 5"  (1.651 m)   Wt 81.2 kg   SpO2 100%   BMI 29.79 kg/m  General:   Alert,  pleasant and cooperative in NAD Head:  Normocephalic and atraumatic. Neck:  Supple; no masses or thyromegaly. Lungs:  Clear throughout to auscultation.    Heart:  Regular rate and rhythm. Abdomen:  Soft, nontender and nondistended. Normal bowel sounds, without guarding, and without rebound.   Neurologic:  Alert and  oriented x4;  grossly normal neurologically.  Impression/Plan: Sherre Lain is here for an endoscopy and colonoscopy to be performed for personal history of colon polyps, BRBPR, chronic abd pain, and  GERD.  Risks, benefits, limitations, and alternatives regarding  endoscopy and colonoscopy have been reviewed with the patient.  Questions have been answered.  All parties agreeable.   Gaylyn Cheers, MD  04/19/2018, 10:30 AM

## 2018-04-19 NOTE — Transfer of Care (Signed)
Immediate Anesthesia Transfer of Care Note  Patient: Jeanette Harris  Procedure(s) Performed: COLONOSCOPY WITH PROPOFOL (N/A ) ESOPHAGOGASTRODUODENOSCOPY (EGD) WITH PROPOFOL (N/A )  Patient Location: PACU  Anesthesia Type:General  Level of Consciousness: sedated  Airway & Oxygen Therapy: Patient Spontanous Breathing and Patient connected to nasal cannula oxygen  Post-op Assessment: Report given to RN and Post -op Vital signs reviewed and stable  Post vital signs: Reviewed and stable  Last Vitals:  Vitals Value Taken Time  BP 99/50 04/19/2018 11:19 AM  Temp 36.1 C 04/19/2018 11:19 AM  Pulse 81 04/19/2018 11:20 AM  Resp 12 04/19/2018 11:20 AM  SpO2 100 % 04/19/2018 11:20 AM  Vitals shown include unvalidated device data.  Last Pain:  Vitals:   04/19/18 1119  TempSrc: Tympanic  PainSc: 0-No pain         Complications: No apparent anesthesia complications

## 2018-04-19 NOTE — Op Note (Signed)
[facility] Gastroenterology Patient Name: Jeanette Harris Procedure Date: 04/19/2018 10:32 AM MRN: [id number] Account #: [id number] Date of Birth: 07/05/1945 Admit Type: Outpatient Age: 73 Room: [facility] ENDO ROOM 1 Gender: Female Note Status: Finalized Procedure:            Colonoscopy Indications:          High risk colon cancer surveillance: Personal history                        of colonic polyps Providers:            Manya Silvas, MD Referring MD:         Precious Bard, MD (Referring MD) Medicines:            Propofol per Anesthesia Complications:        No immediate complications. Procedure:            Pre-Anesthesia Assessment:                       - After reviewing the risks and benefits, the patient                        was deemed in satisfactory condition to undergo the                        procedure.                       After obtaining informed consent, the colonoscope was                        passed under direct vision. Throughout the procedure,                        the patient's blood pressure, pulse, and oxygen                        saturations were monitored continuously. The was                        introduced through the anus and advanced to the the                        cecum, identified by appendiceal orifice and ileocecal                        valve. The colonoscopy was performed without                        difficulty. The patient tolerated the procedure well.                        The quality of the bowel preparation was good. Findings:      Three sessile polyps were found in the sigmoid colon. The polyps were       diminutive in size. These polyps were removed with a jumbo cold forceps.       Resection and retrieval were complete.      Internal hemorrhoids were found during endoscopy. The hemorrhoids were       small and Grade I (internal hemorrhoids that do not prolapse).  The exam was otherwise without  abnormality. Impression:           - Three diminutive polyps in the sigmoid colon, removed                        with a jumbo cold forceps. Resected and retrieved.                       - Internal hemorrhoids.                       - The examination was otherwise normal. Recommendation:       - Await pathology results. Manya Silvas, MD 04/19/2018 11:19:20 AM This report has been signed electronically. Number of Addenda: 0 Note Initiated On: 04/19/2018 10:32 AM Scope Withdrawal Time: 0 hours 13 minutes 10 seconds  Total Procedure Duration: 0 hours 21 minutes 17 seconds       [facility]

## 2018-04-20 LAB — SURGICAL PATHOLOGY

## 2018-04-21 ENCOUNTER — Encounter: Payer: Self-pay | Admitting: Unknown Physician Specialty

## 2018-12-25 ENCOUNTER — Other Ambulatory Visit: Payer: Self-pay

## 2018-12-25 ENCOUNTER — Emergency Department
Admission: EM | Admit: 2018-12-25 | Discharge: 2018-12-25 | Disposition: A | Discharge status: Home / Self Care | Payer: Medicare Other | Attending: Emergency Medicine | Admitting: Emergency Medicine

## 2018-12-25 DIAGNOSIS — Z87891 Personal history of nicotine dependence: Secondary | ICD-10-CM | POA: Diagnosis not present

## 2018-12-25 DIAGNOSIS — J449 Chronic obstructive pulmonary disease, unspecified: Secondary | ICD-10-CM | POA: Diagnosis not present

## 2018-12-25 DIAGNOSIS — R300 Dysuria: Secondary | ICD-10-CM

## 2018-12-25 DIAGNOSIS — Z91040 Latex allergy status: Secondary | ICD-10-CM | POA: Insufficient documentation

## 2018-12-25 DIAGNOSIS — N3001 Acute cystitis with hematuria: Secondary | ICD-10-CM | POA: Insufficient documentation

## 2018-12-25 DIAGNOSIS — R319 Hematuria, unspecified: Secondary | ICD-10-CM | POA: Diagnosis present

## 2018-12-25 DIAGNOSIS — Z79899 Other long term (current) drug therapy: Secondary | ICD-10-CM | POA: Diagnosis not present

## 2018-12-25 LAB — URINALYSIS, COMPLETE (UACMP) WITH MICROSCOPIC
Bilirubin Urine: NEGATIVE
Glucose, UA: NEGATIVE mg/dL
Ketones, ur: NEGATIVE mg/dL
Nitrite: NEGATIVE
Protein, ur: 30 mg/dL — AB
RBC / HPF: >50 RBC/hpf — ABNORMAL HIGH (ref 0–5)
Specific Gravity, Urine: 1.013 (ref 1.005–1.030)
WBC, UA: >50 WBC/hpf — ABNORMAL HIGH (ref 0–5)
pH: 6 (ref 5.0–8.0)

## 2018-12-25 MED ORDER — CEPHALEXIN 250 MG PO CAPS: 250.0000 mg | ORAL_CAPSULE | Freq: Three times a day (TID) | ORAL | 0 refills | Status: DC

## 2018-12-25 MED ORDER — PHENAZOPYRIDINE HCL 200 MG PO TABS: 200.0000 mg | ORAL_TABLET | Freq: Three times a day (TID) | ORAL | 0 refills | Status: DC | PRN

## 2018-12-25 MED ORDER — PHENAZOPYRIDINE HCL 200 MG PO TABS
200.0000 mg | ORAL_TABLET | Freq: Once | ORAL | Status: AC
  Administered 2018-12-25: 200 mg via ORAL
  Filled 2018-12-25: qty 1

## 2018-12-25 MED ORDER — CEPHALEXIN 250 MG PO CAPS
250.0000 mg | ORAL_CAPSULE | Freq: Once | ORAL | Status: AC
  Administered 2018-12-25: 250 mg via ORAL
  Filled 2018-12-25: qty 1

## 2018-12-25 NOTE — Discharge Instructions (Addendum)
1.  Start antibiotic as prescribed (Keflex 250 mg 3 times daily x7 days). 2.  Take urinary analgesic as prescribed (Pyridium #6). 3.  Return to the ER for worsening symptoms, persistent vomiting, fever, difficulty breathing or other concerns.

## 2018-12-25 NOTE — ED Triage Notes (Signed)
Pt states hematuria for 2 days. Pt denies nausea, fever, abd pain, vomiting. States does have some back pain.

## 2018-12-25 NOTE — ED Provider Notes (Signed)
[facility] Emergency Department Provider Note   ____________________________________________   First MD Initiated Contact with Patient 12/25/18 0606     (approximate)  I have reviewed the triage vital signs and the nursing notes.   HISTORY  Chief Complaint Hematuria    HPI Jeanette Harris is a 73 y.o. female who presents to the ED from home with a chief complaint of hematuria.  Onset x2 days.  Complains of dysuria as well as blood-tinged urine.  Denies associated fever, abdominal/flank pain, nausea/vomiting.  Denies history of kidney stones.  Denies recent travel or trauma.       Past Medical History:  Diagnosis Date  . Arthritis   . Complication of anesthesia   . COPD (chronic obstructive pulmonary disease) ([place])    patient denies  . DVT (deep venous thrombosis) ([place]) 04/01/2017   left lower leg  . GERD (gastroesophageal reflux disease)   . Headache   . Heart murmur   . Peripheral vascular disease ([facility])    phlebitis  . Phlebitis   . PONV (postoperative nausea and vomiting)     Patient Active Problem List   Diagnosis Date Noted  . GERD (gastroesophageal reflux disease) 07/20/2017  . DJD (degenerative joint disease) 06/21/2017  . Deep vein thrombosis (DVT) of left lower extremity ([place]) 06/01/2017  . Venous reflux 06/01/2017  . 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 DDD (degenerative disc disease), lumbosacral 06/01/2017    Past Surgical History:  Procedure Laterality Date  . ABDOMINAL HYSTERECTOMY    . ANKLE SURGERY    . APPENDECTOMY    . BACK SURGERY    . CATARACT EXTRACTION W/PHACO Left 02/13/2016   Procedure: CATARACT EXTRACTION PHACO AND INTRAOCULAR LENS PLACEMENT (IOC);  Surgeon: Leandrew Koyanagi, MD;  Location: [place];  Service: Ophthalmology;  Laterality: Left;  TORIC  . CATARACT EXTRACTION W/PHACO Right 02/27/2016   Procedure: CATARACT EXTRACTION PHACO AND INTRAOCULAR LENS PLACEMENT (IOC);  Surgeon: Leandrew Koyanagi, MD;  Location: [place];  Service: Ophthalmology;  Laterality: Right;  TORIC Latex sensitivity  . CHOLECYSTECTOMY    . COLONOSCOPY    . COLONOSCOPY WITH PROPOFOL N/A 04/19/2018   Procedure: COLONOSCOPY WITH PROPOFOL;  Surgeon: Manya Silvas, MD;  Location: [facility] ENDOSCOPY;  Service: Endoscopy;  Laterality: N/A;  . ESOPHAGOGASTRODUODENOSCOPY    . ESOPHAGOGASTRODUODENOSCOPY (EGD) WITH PROPOFOL N/A 04/19/2018   Procedure: ESOPHAGOGASTRODUODENOSCOPY (EGD) WITH PROPOFOL;  Surgeon: Manya Silvas, MD;  Location: [facility] ENDOSCOPY;  Service: Endoscopy;  Laterality: N/A;  . FOOT SURGERY    . KNEE ARTHROSCOPY WITH MENISCAL REPAIR Left 10/01/2017   Procedure: KNEE ARTHROSCOPY PARTIAL AND LATERAL MENISCECTOMY AND CHONDROPLASTY;  Surgeon: Leim Fabry, MD;  Location: [place];  Service: Orthopedics;  Laterality: Left;  . SHOULDER ARTHROSCOPY      Prior to Admission medications   Medication Sig Start Date End Date Taking? Authorizing Provider  apixaban (ELIQUIS) 5 MG TABS tablet Take 1 tablet (5 mg total) by mouth 2 (two) times daily for 14 days. 10/02/17 10/16/17  Leim Fabry, MD  calcium-vitamin D (OSCAL WITH D) 500-200 MG-UNIT tablet Take 1 tablet by mouth.    [provider]  Cyanocobalamin (B-12 COMPLIANCE INJECTION IJ) Inject as directed.    [provider]  docusate sodium (COLACE) 250 MG capsule Take 250 mg by mouth daily.    [provider]  lansoprazole (PREVACID) 30 MG capsule Take 30 mg by mouth daily at 12 noon.    [provider]  Multiple Vitamin (MULTIVITAMIN) capsule Take 1 capsule by mouth daily.    [provider]  nortriptyline (PAMELOR) 75 MG capsule Take 75 mg by mouth at bedtime.    [provider]  ondansetron (ZOFRAN ODT) 4 MG disintegrating tablet Take 1 tablet (4 mg total) by mouth every 8 (eight) hours as needed for nausea or vomiting. 10/01/17   Leim Fabry, MD  rOPINIRole (REQUIP) 0.5 MG  tablet Take 0.5 mg by mouth at bedtime.    [provider]  sucralfate (CARAFATE) 1 g tablet Take 1 g by mouth 4 (four) times daily -  with meals and at bedtime.    [provider]  topiramate (TOPAMAX) 50 MG tablet Take 50 mg by mouth 2 (two) times daily.    [provider]    Allergies Aspirin, Ivp dye [iodinated diagnostic agents], Nsaids, and Latex  Family History  Problem Relation Age of Onset  . Breast cancer Maternal Aunt     Social History Social History   Tobacco Use  . Smoking status: Former Research scientist (life sciences)  . Smokeless tobacco: Never Used  . Tobacco comment: quit in the 1980s  Substance Use Topics  . Alcohol use: No  . Drug use: No    Review of Systems  Constitutional: No fever/chills Eyes: No visual changes. ENT: No sore throat. Cardiovascular: Denies chest pain. Respiratory: Denies shortness of breath. Gastrointestinal: No abdominal pain.  No nausea, no vomiting.  No diarrhea.  No constipation. Genitourinary: Positive for dysuria and hematuria. Musculoskeletal: Negative for back pain. Skin: Negative for rash. Neurological: Negative for headaches, focal weakness or numbness.   ____________________________________________   PHYSICAL EXAM:  VITAL SIGNS: ED Triage Vitals [12/25/18 0451]  Enc Vitals Group     BP 139/74     Pulse Rate 99     Resp 16     Temp 98.5 F (36.9 C)     Temp Source Oral     SpO2 98 %     Weight 178 lb (80.7 kg)     Height 5\' 6"  (1.676 m)     Head Circumference      Peak Flow      Pain Score 8     Pain Loc      Pain Edu?      Excl. in [place]?     Constitutional: Alert and oriented. Well appearing and in no acute distress. Eyes: Conjunctivae are normal. PERRL. EOMI. Head: Atraumatic. Nose: No congestion/rhinnorhea. Mouth/Throat: Mucous membranes are moist.  Oropharynx non-erythematous. Neck: No stridor.   Cardiovascular: Normal rate, regular rhythm. Grossly normal heart sounds.  Good peripheral  circulation. Respiratory: Normal respiratory effort.  No retractions. Lungs CTAB. Gastrointestinal: Soft and nontender to light or deep palpation. No distention. No abdominal bruits. No CVA tenderness. Musculoskeletal: No lower extremity tenderness nor edema.  No joint effusions. Neurologic:  Normal speech and language. No gross focal neurologic deficits are appreciated. No gait instability. Skin:  Skin is warm, dry and intact. No rash noted. Psychiatric: Mood and affect are normal. Speech and behavior are normal.  ____________________________________________   LABS (all labs ordered are listed, but only abnormal results are displayed)  Labs Reviewed  URINALYSIS, COMPLETE (UACMP) WITH MICROSCOPIC - Abnormal; Notable for the following components:      Result Value   Color, Urine YELLOW (*)    APPearance TURBID (*)    Hgb urine dipstick LARGE (*)    Protein, ur 30 (*)    Leukocytes,Ua LARGE (*)    RBC /  HPF >50 (*)    WBC, UA >50 (*)    Bacteria, UA FEW (*)    All other components within normal limits  URINE CULTURE   ____________________________________________  EKG  None ____________________________________________  RADIOLOGY  ED MD interpretation: None  Official radiology report(s): No results found.  ____________________________________________   PROCEDURES  Procedure(s) performed (including Critical Care):  Procedures   ____________________________________________   INITIAL IMPRESSION / ASSESSMENT AND PLAN / ED COURSE  As part of my medical decision making, I reviewed the following data within the [place] notes reviewed and incorporated, Labs reviewed and Notes from prior ED visits     DEERICA MOEHLE was evaluated in Emergency Department on 12/25/2018 for the symptoms described in the history of present illness. She was evaluated in the context of the global COVID-19 pandemic, which necessitated consideration that the patient  might be at risk for infection with the SARS-CoV-2 virus that causes COVID-19. Institutional protocols and algorithms that pertain to the evaluation of patients at risk for COVID-19 are in a state of rapid change based on information released by regulatory bodies including the CDC and federal and state organizations. These policies and algorithms were followed during the patient's care in the ED.    73 year old female who presents with dysuria and hematuria.  Urinalysis remarkable for large leukocytes, greater than 50 RBC and WBC.  Will culture urine.  Start Keflex, Pyridium and patient will follow up with her PCP next week.  Strict return precautions given.  Patient verbalizes understanding and agrees with plan of care.      ____________________________________________   FINAL CLINICAL IMPRESSION(S) / ED DIAGNOSES  Final diagnoses:  Dysuria  Acute cystitis with hematuria     ED Discharge Orders    None       Note:  This document was prepared using Dragon voice recognition software and may include unintentional dictation errors.   Paulette Blanch, MD 12/25/18 [phone]

## 2018-12-26 LAB — URINE CULTURE: Culture: 30000 — AB

## 2019-01-18 ENCOUNTER — Other Ambulatory Visit: Payer: Self-pay | Admitting: Physician Assistant

## 2019-01-18 DIAGNOSIS — Z1231 Encounter for screening mammogram for malignant neoplasm of breast: Secondary | ICD-10-CM

## 2019-02-18 ENCOUNTER — Ambulatory Visit
Admission: RE | Admit: 2019-02-18 | Discharge: 2019-02-18 | Disposition: A | Discharge status: Home / Self Care | Payer: Medicare Other | Source: Ambulatory Visit | Attending: Physician Assistant | Admitting: Physician Assistant

## 2019-02-18 DIAGNOSIS — Z1231 Encounter for screening mammogram for malignant neoplasm of breast: Secondary | ICD-10-CM | POA: Insufficient documentation

## 2019-03-11 DIAGNOSIS — C4490 Unspecified malignant neoplasm of skin, unspecified: Secondary | ICD-10-CM

## 2019-03-11 HISTORY — DX: Unspecified malignant neoplasm of skin, unspecified: C44.90

## 2019-09-26 DIAGNOSIS — M19111 Post-traumatic osteoarthritis, right shoulder: Secondary | ICD-10-CM | POA: Insufficient documentation

## 2019-09-26 DIAGNOSIS — M7581 Other shoulder lesions, right shoulder: Secondary | ICD-10-CM | POA: Insufficient documentation

## 2019-09-27 ENCOUNTER — Other Ambulatory Visit: Payer: Self-pay | Admitting: Surgery

## 2019-09-27 DIAGNOSIS — M7581 Other shoulder lesions, right shoulder: Secondary | ICD-10-CM

## 2019-09-27 DIAGNOSIS — M25511 Pain in right shoulder: Secondary | ICD-10-CM

## 2019-09-27 DIAGNOSIS — M19111 Post-traumatic osteoarthritis, right shoulder: Secondary | ICD-10-CM

## 2019-10-14 ENCOUNTER — Other Ambulatory Visit: Payer: Self-pay

## 2019-10-14 ENCOUNTER — Ambulatory Visit
Admission: RE | Admit: 2019-10-14 | Discharge: 2019-10-14 | Disposition: A | Discharge status: Home / Self Care | Payer: Medicare Other | Source: Ambulatory Visit | Attending: Surgery | Admitting: Surgery

## 2019-10-14 DIAGNOSIS — M19111 Post-traumatic osteoarthritis, right shoulder: Secondary | ICD-10-CM | POA: Insufficient documentation

## 2019-10-14 DIAGNOSIS — M25511 Pain in right shoulder: Secondary | ICD-10-CM | POA: Insufficient documentation

## 2019-10-14 DIAGNOSIS — M7581 Other shoulder lesions, right shoulder: Secondary | ICD-10-CM | POA: Diagnosis present

## 2019-10-28 ENCOUNTER — Other Ambulatory Visit: Payer: Self-pay | Admitting: Surgery

## 2019-10-28 ENCOUNTER — Other Ambulatory Visit (HOSPITAL_COMMUNITY): Payer: Self-pay | Admitting: Surgery

## 2019-10-28 ENCOUNTER — Encounter (INDEPENDENT_AMBULATORY_CARE_PROVIDER_SITE_OTHER): Payer: Self-pay

## 2019-10-28 ENCOUNTER — Other Ambulatory Visit: Payer: Self-pay

## 2019-10-28 ENCOUNTER — Ambulatory Visit
Admission: RE | Admit: 2019-10-28 | Discharge: 2019-10-28 | Disposition: A | Discharge status: Home / Self Care | Payer: Medicare Other | Source: Ambulatory Visit | Attending: Surgery | Admitting: Surgery

## 2019-10-28 DIAGNOSIS — M79662 Pain in left lower leg: Secondary | ICD-10-CM

## 2019-10-28 DIAGNOSIS — M7989 Other specified soft tissue disorders: Secondary | ICD-10-CM | POA: Insufficient documentation

## 2019-11-02 ENCOUNTER — Other Ambulatory Visit: Payer: Self-pay | Admitting: Physician Assistant

## 2019-11-02 DIAGNOSIS — R1031 Right lower quadrant pain: Secondary | ICD-10-CM

## 2019-11-02 DIAGNOSIS — R102 Pelvic and perineal pain: Secondary | ICD-10-CM

## 2019-11-08 ENCOUNTER — Ambulatory Visit
Admission: RE | Admit: 2019-11-08 | Discharge: 2019-11-08 | Disposition: A | Discharge status: Home / Self Care | Payer: Medicare Other | Source: Ambulatory Visit | Attending: Physician Assistant | Admitting: Physician Assistant

## 2019-11-08 ENCOUNTER — Other Ambulatory Visit: Payer: Self-pay

## 2019-11-08 DIAGNOSIS — R102 Pelvic and perineal pain: Secondary | ICD-10-CM | POA: Insufficient documentation

## 2019-11-08 DIAGNOSIS — R1031 Right lower quadrant pain: Secondary | ICD-10-CM | POA: Diagnosis present

## 2019-11-08 DIAGNOSIS — R1032 Left lower quadrant pain: Secondary | ICD-10-CM

## 2019-12-28 ENCOUNTER — Encounter: Payer: Self-pay | Admitting: Dermatology

## 2019-12-28 ENCOUNTER — Ambulatory Visit: Payer: Medicare Other | Admitting: Dermatology

## 2019-12-28 ENCOUNTER — Other Ambulatory Visit: Payer: Self-pay

## 2019-12-28 DIAGNOSIS — D1800 Hemangioma unspecified site: Secondary | ICD-10-CM

## 2019-12-28 DIAGNOSIS — D485 Neoplasm of uncertain behavior of skin: Secondary | ICD-10-CM

## 2019-12-28 DIAGNOSIS — L570 Actinic keratosis: Secondary | ICD-10-CM

## 2019-12-28 DIAGNOSIS — I781 Nevus, non-neoplastic: Secondary | ICD-10-CM

## 2019-12-28 DIAGNOSIS — Z1283 Encounter for screening for malignant neoplasm of skin: Secondary | ICD-10-CM

## 2019-12-28 DIAGNOSIS — D229 Melanocytic nevi, unspecified: Secondary | ICD-10-CM

## 2019-12-28 DIAGNOSIS — L578 Other skin changes due to chronic exposure to nonionizing radiation: Secondary | ICD-10-CM

## 2019-12-28 DIAGNOSIS — L821 Other seborrheic keratosis: Secondary | ICD-10-CM

## 2019-12-28 DIAGNOSIS — C4491 Basal cell carcinoma of skin, unspecified: Secondary | ICD-10-CM

## 2019-12-28 HISTORY — DX: Basal cell carcinoma of skin, unspecified: C44.91

## 2019-12-28 NOTE — Patient Instructions (Signed)
Wound Care Instructions  1. Cleanse wound gently with soap and water once a day then pat dry with clean gauze. Apply a thing coat of Petrolatum (petroleum jelly, "Vaseline") over the wound (unless you have an allergy to this). We recommend that you use a new, sterile tube of Vaseline. Do not pick or remove scabs. Do not remove the yellow or white "healing tissue" from the base of the wound.  2. Cover the wound with fresh, clean, nonstick gauze and secure with paper tape. You may use Band-Aids in place of gauze and tape if the would is small enough, but would recommend trimming much of the tape off as there is often too much. Sometimes Band-Aids can irritate the skin.  3. You should call the office for your biopsy report after 1 week if you have not already been contacted.  4. If you experience any problems, such as abnormal amounts of bleeding, swelling, significant bruising, significant pain, or evidence of infection, please call the office immediately.   Seborrheic Keratosis  What causes seborrheic keratoses? Seborrheic keratoses are harmless, common skin growths that first appear during adult life.  As time goes by, more growths appear.  Some people may develop a large number of them.  Seborrheic keratoses appear on both covered and uncovered body parts.  They are not caused by sunlight.  The tendency to develop seborrheic keratoses can be inherited.  They vary in color from skin-colored to gray, brown, or even black.  They can be either smooth or have a rough, warty surface.   Seborrheic keratoses are superficial and look as if they were stuck on the skin.  Under the microscope this type of keratosis looks like layers upon layers of skin.  That is why at times the top layer may seem to fall off, but the rest of the growth remains and re-grows.    Treatment Seborrheic keratoses do not need to be treated, but can easily be removed in the office.  Seborrheic keratoses often cause symptoms when  they rub on clothing or jewelry.  Lesions can be in the way of shaving.  If they become inflamed, they can cause itching, soreness, or burning.  Removal of a seborrheic keratosis can be accomplished by freezing, burning, or surgery. If any spot bleeds, scabs, or grows rapidly, please return to have it checked, as these can be an indication of a skin cancer.

## 2019-12-28 NOTE — Progress Notes (Signed)
New Patient Visit  Subjective  Jeanette Harris is a 74 y.o. female who presents for the following: Skin Check and Growth (Superior buttocks, present x 20 years or more. Red bump appeared in center of growth 6 months ago, painful.). Patient has been seen by a dermatologist in the past, but they have moved to a different location. She would like to establish care here.  She has some other persistent scaly spots she would like checked.  She requests a TBSE today.  "Mole" has been present for at least 20 years. Area started to grow larger about 15 years ago. She had it looked at many times and was always told it was ok. Red spot appeared in the center of mole 6 months ago. Area is painful for patient to lay on.  The following portions of the chart were reviewed this encounter and updated as appropriate:      Review of Systems:  No other skin or systemic complaints except as noted in HPI or Assessment and Plan.  Objective  Well appearing patient in no apparent distress; mood and affect are within normal limits.  A full examination was performed including scalp, head, eyes, ears, nose, lips, neck, chest, axillae, abdomen, back, buttocks, bilateral upper extremities, bilateral lower extremities, hands, feet, fingers, toes, fingernails, and toenails. All findings within normal limits unless otherwise noted below.  Objective  Right Upper Buttock: 6.33mm pink pearly papule with surrounding waxy brown patch      Objective  Right Wrist x 2, R ant thigh x 1, L paranasal x 1, nasal root x 1 (5): Erythematous thin papules/macules with gritty scale.    Assessment & Plan   Skin cancer screening performed today.  Actinic Damage - diffuse scaly erythematous macules with underlying dyspigmentation - Recommend daily broad spectrum sunscreen SPF 30+ to sun-exposed areas, reapply every 2 hours as needed.  - Call for new or changing lesions.  Lentigines - Scattered tan macules - Discussed due to  sun exposure - Benign, observe - Call for any changes  Seborrheic Keratoses - Stuck-on, waxy, tan-brown papules and plaques  - Discussed benign etiology and prognosis. - Observe - Call for any changes  Hemangiomas - Red papules - Discussed benign nature - Observe - Call for any changes  Spider Veins - Dilated blue, purple or red veins at the lower extremities - Reassured - These can be treated by sclerotherapy (a procedure to inject a medicine into the veins to make them disappear) if desired, but the treatment is not covered by insurance  Melanocytic Nevi - Tan-brown and/or pink-flesh-colored symmetric macules and papules, including brown papule on left lower abdomen - Benign appearing on exam today - Observation - Call clinic for new or changing moles - Recommend daily use of broad spectrum spf 30+ sunscreen to sun-exposed areas.    Neoplasm of uncertain behavior of skin Right Upper Buttock  Skin / nail biopsy Type of biopsy: tangential   Informed consent: discussed and consent obtained   Patient was prepped and draped in usual sterile fashion: Area prepped with alcohol. Anesthesia: the lesion was anesthetized in a standard fashion   Anesthetic:  1% lidocaine w/ epinephrine 1-100,000 buffered w/ 8.4% NaHCO3 Instrument used: flexible razor blade   Hemostasis achieved with: pressure, aluminum chloride and electrodesiccation   Outcome: patient tolerated procedure well   Post-procedure details: wound care instructions given   Post-procedure details comment:  Ointment and small bandage applied  Specimen 1 - Surgical pathology Differential Diagnosis: Hemangioma  vs BCC arising in SK vs other Check Margins: No 6.48mm pink pearly papule with surrounding waxy brown patch   SK, r/o BCC within lesion Central pink bump biopsied today  AK (actinic keratosis) (5) Right Wrist x 2, R ant thigh x 1, L paranasal x 1, nasal root x 1  AK vs ISK  Destruction of lesion - Right  Wrist x 2, R ant thigh x 1, L paranasal x 1, nasal root x 1  Destruction method: cryotherapy   Informed consent: discussed and consent obtained   Lesion destroyed using liquid nitrogen: Yes   Region frozen until ice ball extended beyond lesion: Yes   Outcome: patient tolerated procedure well with no complications   Post-procedure details: wound care instructions given     Return pending biopsy.  IJamesetta Orleans, CMA, am acting as scribe for Brendolyn Patty, MD .  Documentation: I have reviewed the above documentation for accuracy and completeness, and I agree with the above.  Brendolyn Patty MD

## 2020-01-02 ENCOUNTER — Telehealth: Payer: Self-pay

## 2020-01-02 NOTE — Telephone Encounter (Signed)
Advised pt of bx results and scheduled pt for EDC./sh 

## 2020-01-02 NOTE — Telephone Encounter (Signed)
-----   Message from Brendolyn Patty, MD sent at 01/02/2020  9:01 AM EDT ----- Skin , right upper buttock BASAL CELL CARCINOMA, NODULAR PATTERN  BCC- pt needs EDC

## 2020-01-17 ENCOUNTER — Other Ambulatory Visit: Payer: Self-pay | Admitting: Physician Assistant

## 2020-01-17 DIAGNOSIS — Z1231 Encounter for screening mammogram for malignant neoplasm of breast: Secondary | ICD-10-CM

## 2020-01-25 ENCOUNTER — Other Ambulatory Visit: Payer: Self-pay

## 2020-01-25 ENCOUNTER — Ambulatory Visit: Payer: Medicare Other | Admitting: Dermatology

## 2020-01-25 DIAGNOSIS — L578 Other skin changes due to chronic exposure to nonionizing radiation: Secondary | ICD-10-CM

## 2020-01-25 DIAGNOSIS — C44519 Basal cell carcinoma of skin of other part of trunk: Secondary | ICD-10-CM | POA: Diagnosis not present

## 2020-01-25 DIAGNOSIS — L820 Inflamed seborrheic keratosis: Secondary | ICD-10-CM

## 2020-01-25 NOTE — Progress Notes (Addendum)
   Follow-Up Visit   Subjective  Jeanette Harris is a 74 y.o. female who presents for the following: Skin Cancer (Right upper buttock. BCC. Here for Tx.) and Skin Problem (Right upper buttock. C/O painful area around Bx site.).    The following portions of the chart were reviewed this encounter and updated as appropriate:     Review of Systems: No other skin or systemic complaints except as noted in HPI or Assessment and Plan.  Objective  Well appearing patient in no apparent distress; mood and affect are within normal limits.  A focused examination was performed including face and buttocks. Relevant physical exam findings are noted in the Assessment and Plan.  Objective  Right upper buttock: Pink papule within ISK, Bx proven BCC.  Objective  Right upper buttock x1: Waxy tan plaque with erythema surround BCC site.  Assessment & Plan  Basal cell carcinoma (BCC) of skin of other part of torso Right upper buttock  Destruction of lesion  Destruction method: electrodesiccation and curettage   Timeout:  patient name, date of birth, surgical site, and procedure verified Anesthesia: the lesion was anesthetized in a standard fashion   Anesthetic:  1% lidocaine w/ epinephrine 1-100,000 buffered w/ 8.4% NaHCO3 Curettage performed in three different directions: Yes   Electrodesiccation performed over the curetted area: Yes   Lesion length (cm):  0.6 Lesion width (cm):  0.6 Margin per side (cm):  0.1 Final wound size (cm):  0.8 Hemostasis achieved with:  pressure, aluminum chloride and electrodesiccation Outcome: patient tolerated procedure well with no complications   Post-procedure details: wound care instructions given    Inflamed seborrheic keratosis Right upper buttock x1  Destruction of lesion - Right upper buttock x1  Destruction method: cryotherapy   Informed consent: discussed and consent obtained   Lesion destroyed using liquid nitrogen: Yes   Region frozen until ice  ball extended beyond lesion: Yes   Outcome: patient tolerated procedure well with no complications   Post-procedure details: wound care instructions given    Actinic Damage - chronic, secondary to cumulative UV radiation exposure/sun exposure over time - diffuse pink/brown macules with underlying dyspigmentation face - Recommend daily broad spectrum sunscreen SPF 30+ to sun-exposed areas, reapply every 2 hours as needed.  - Call for new or changing lesions.  Return in about 2 months (around 03/26/2020) for ISK and BCC recheck.   I, Emelia Salisbury, CMA, am acting as scribe for Brendolyn Patty, MD.  Documentation: I have reviewed the above documentation for accuracy and completeness, and I agree with the above.  Brendolyn Patty MD

## 2020-01-25 NOTE — Patient Instructions (Signed)
Wound Care Instructions  1. Cleanse wound gently with soap and water once a day then pat dry with clean gauze. Apply a thing coat of Petrolatum (petroleum jelly, "Vaseline") over the wound (unless you have an allergy to this). We recommend that you use a new, sterile tube of Vaseline. Do not pick or remove scabs. Do not remove the yellow or white "healing tissue" from the base of the wound.  2. Cover the wound with fresh, clean, nonstick gauze and secure with paper tape. You may use Band-Aids in place of gauze and tape if the would is small enough, but would recommend trimming much of the tape off as there is often too much. Sometimes Band-Aids can irritate the skin.  3. You should call the office for your biopsy report after 1 week if you have not already been contacted.  4. If you experience any problems, such as abnormal amounts of bleeding, swelling, significant bruising, significant pain, or evidence of infection, please call the office immediately.  5. FOR ADULT SURGERY PATIENTS: If you need something for pain relief you may take 1 extra strength Tylenol (acetaminophen) AND 2 Ibuprofen (200mg each) together every 4 hours as needed for pain. (do not take these if you are allergic to them or if you have a reason you should not take them.) Typically, you may only need pain medication for 1 to 3 days.   Cryotherapy Aftercare  . Wash gently with soap and water everyday.   . Apply Vaseline and Band-Aid daily until healed.  Prior to procedure, discussed risks of blister formation, small wound, skin dyspigmentation, or rare scar following cryotherapy.   

## 2020-02-14 ENCOUNTER — Other Ambulatory Visit: Payer: Self-pay | Admitting: Physician Assistant

## 2020-02-14 DIAGNOSIS — N63 Unspecified lump in unspecified breast: Secondary | ICD-10-CM

## 2020-03-08 ENCOUNTER — Other Ambulatory Visit: Payer: Medicare Other

## 2020-03-16 ENCOUNTER — Inpatient Hospital Stay: Payer: Medicare Other | Attending: Oncology | Admitting: Oncology

## 2020-03-16 ENCOUNTER — Inpatient Hospital Stay: Payer: Medicare Other

## 2020-03-16 ENCOUNTER — Encounter (INDEPENDENT_AMBULATORY_CARE_PROVIDER_SITE_OTHER): Payer: Self-pay

## 2020-03-16 ENCOUNTER — Encounter: Payer: Self-pay | Admitting: Oncology

## 2020-03-16 VITALS — BP 124/67 | HR 84 | Temp 96.0°F | Resp 18 | Ht 66.0 in | Wt 181.0 lb

## 2020-03-16 DIAGNOSIS — D692 Other nonthrombocytopenic purpura: Secondary | ICD-10-CM

## 2020-03-16 DIAGNOSIS — Z79899 Other long term (current) drug therapy: Secondary | ICD-10-CM | POA: Diagnosis not present

## 2020-03-16 DIAGNOSIS — D7589 Other specified diseases of blood and blood-forming organs: Secondary | ICD-10-CM | POA: Insufficient documentation

## 2020-03-16 DIAGNOSIS — R202 Paresthesia of skin: Secondary | ICD-10-CM | POA: Insufficient documentation

## 2020-03-16 DIAGNOSIS — Z85828 Personal history of other malignant neoplasm of skin: Secondary | ICD-10-CM | POA: Diagnosis not present

## 2020-03-16 DIAGNOSIS — I739 Peripheral vascular disease, unspecified: Secondary | ICD-10-CM | POA: Diagnosis not present

## 2020-03-16 DIAGNOSIS — J449 Chronic obstructive pulmonary disease, unspecified: Secondary | ICD-10-CM | POA: Diagnosis not present

## 2020-03-16 DIAGNOSIS — Z8616 Personal history of COVID-19: Secondary | ICD-10-CM | POA: Diagnosis not present

## 2020-03-16 DIAGNOSIS — Z87891 Personal history of nicotine dependence: Secondary | ICD-10-CM | POA: Diagnosis not present

## 2020-03-16 DIAGNOSIS — M1990 Unspecified osteoarthritis, unspecified site: Secondary | ICD-10-CM | POA: Insufficient documentation

## 2020-03-16 DIAGNOSIS — G43909 Migraine, unspecified, not intractable, without status migrainosus: Secondary | ICD-10-CM | POA: Diagnosis not present

## 2020-03-16 DIAGNOSIS — L988 Other specified disorders of the skin and subcutaneous tissue: Secondary | ICD-10-CM | POA: Insufficient documentation

## 2020-03-16 DIAGNOSIS — R200 Anesthesia of skin: Secondary | ICD-10-CM | POA: Diagnosis not present

## 2020-03-16 DIAGNOSIS — R230 Cyanosis: Secondary | ICD-10-CM

## 2020-03-16 DIAGNOSIS — K219 Gastro-esophageal reflux disease without esophagitis: Secondary | ICD-10-CM | POA: Diagnosis not present

## 2020-03-16 DIAGNOSIS — Z7901 Long term (current) use of anticoagulants: Secondary | ICD-10-CM | POA: Diagnosis not present

## 2020-03-16 DIAGNOSIS — Z86718 Personal history of other venous thrombosis and embolism: Secondary | ICD-10-CM | POA: Diagnosis not present

## 2020-03-16 DIAGNOSIS — K739 Chronic hepatitis, unspecified: Secondary | ICD-10-CM | POA: Insufficient documentation

## 2020-03-16 LAB — VITAMIN B12: Vitamin B-12: 1274 pg/mL — ABNORMAL HIGH (ref 180–914)

## 2020-03-16 LAB — CBC WITH DIFFERENTIAL/PLATELET
Abs Immature Granulocytes: 0.06 10*3/uL (ref 0.00–0.07)
Basophils Absolute: 0 10*3/uL (ref 0.0–0.1)
Basophils Relative: 0 %
Eosinophils Absolute: 0.1 10*3/uL (ref 0.0–0.5)
Eosinophils Relative: 1 %
HCT: 39.6 % (ref 36.0–46.0)
Hemoglobin: 13.5 g/dL (ref 12.0–15.0)
Immature Granulocytes: 1 %
Lymphocytes Relative: 25 %
Lymphs Abs: 2 10*3/uL (ref 0.7–4.0)
MCH: 34.2 pg — ABNORMAL HIGH (ref 26.0–34.0)
MCHC: 34.1 g/dL (ref 30.0–36.0)
MCV: 100.3 fL — ABNORMAL HIGH (ref 80.0–100.0)
Monocytes Absolute: 0.6 10*3/uL (ref 0.1–1.0)
Monocytes Relative: 7 %
Neutro Abs: 5.3 10*3/uL (ref 1.7–7.7)
Neutrophils Relative %: 66 %
Platelets: 268 10*3/uL (ref 150–400)
RBC: 3.95 MIL/uL (ref 3.87–5.11)
RDW: 12.6 % (ref 11.5–15.5)
WBC: 8.1 10*3/uL (ref 4.0–10.5)
nRBC: 0 % (ref 0.0–0.2)

## 2020-03-16 LAB — COMPREHENSIVE METABOLIC PANEL
ALT: 22 U/L (ref 0–44)
AST: 14 U/L — ABNORMAL LOW (ref 15–41)
Albumin: 3.9 g/dL (ref 3.5–5.0)
Alkaline Phosphatase: 54 U/L (ref 38–126)
Anion gap: 8 (ref 5–15)
BUN: 29 mg/dL — ABNORMAL HIGH (ref 8–23)
CO2: 24 mmol/L (ref 22–32)
Calcium: 9 mg/dL (ref 8.9–10.3)
Chloride: 104 mmol/L (ref 98–111)
Creatinine, Ser: 0.83 mg/dL (ref 0.44–1.00)
GFR, Estimated: >60 mL/min (ref >60)
Glucose, Bld: 89 mg/dL (ref 70–99)
Potassium: 3.6 mmol/L (ref 3.5–5.1)
Sodium: 136 mmol/L (ref 135–145)
Total Bilirubin: 0.2 mg/dL — ABNORMAL LOW (ref 0.3–1.2)
Total Protein: 7.2 g/dL (ref 6.5–8.1)

## 2020-03-16 LAB — RETIC PANEL
Immature Retic Fract: 14.5 % (ref 2.3–15.9)
RBC.: 3.9 MIL/uL (ref 3.87–5.11)
Retic Count, Absolute: 60.1 10*3/uL (ref 19.0–186.0)
Retic Ct Pct: 1.5 % (ref 0.4–3.1)
Reticulocyte Hemoglobin: 35.6 pg (ref >27.9)

## 2020-03-16 LAB — LACTATE DEHYDROGENASE: LDH: 142 U/L (ref 98–192)

## 2020-03-16 LAB — FOLATE: Folate: 7.9 ng/mL (ref >5.9)

## 2020-03-16 LAB — PATHOLOGIST SMEAR REVIEW

## 2020-03-16 NOTE — Progress Notes (Signed)
Hematology/Oncology Consult note [facility] Telephone:([phone] [phone]   Patient Care Team: Marinda Elk, MD as PCP - General (Physician Assistant)  REFERRING PROVIDER: Marinda Elk, MD  CHIEF COMPLAINTS/REASON FOR VISIT:  Evaluation of macrocytosis  HISTORY OF PRESENTING ILLNESS:   Jeanette Harris is a  75 y.o.  female with PMH listed below was seen in consultation at the request of  Marinda Elk, MD  for evaluation of macrocytosis  Reviewed patient's blood work done chronic hepatitis obvious. 02/07/2020, CBC showed hemoglobin 12.8, MCV 104.5, WBC 4.4, decreased lymphocyte, increased eosinophil percentage.  Reviewed past lab work.  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  Folate and vitamin B12 level have been both as stated in past and within normal limits. Patient was referred to hematology oncology for further evaluation.  Patient reports chronic lower extremity peripheral for 10 to 20 years.  She was previously seen by vascular surgeon and was told that " this is too deep and nothing can be done:.  He reports bilateral toes numbness tingling, cold sensation. Denies weight loss, fever, chills, fatigue, night sweats.  Patient was accompanied by her sister.  Patient has migraine and is on Topamax, chronically.  Recent COVID-19 infection.  Today her symptom has resolved. Review of Systems  Constitutional: Negative for appetite change, chills, fatigue and fever.  HENT:   Negative for hearing loss and voice change.   Eyes: Negative for eye problems.  Respiratory: Negative for chest tightness and cough.   Cardiovascular: Negative for chest pain.  Gastrointestinal: Negative for abdominal distention, abdominal pain and blood in stool.  Endocrine: Negative for hot flashes.  Genitourinary: Negative for difficulty urinating and frequency.   Musculoskeletal: Negative for  arthralgias.  Skin: Negative for itching and rash.       Lower extremity purpura  Neurological: Negative for extremity weakness.  Hematological: Negative for adenopathy.  Psychiatric/Behavioral: Negative for confusion.     MEDICAL HISTORY:  Past Medical History:  Diagnosis Date  . Arthritis   . Basal cell carcinoma 12/28/2019   R upper buttock  . Complication of anesthesia   . COPD (chronic obstructive pulmonary disease) ([place])    patient denies  . DVT (deep venous thrombosis) ([place]) 04/01/2017   left lower leg  . GERD (gastroesophageal reflux disease)   . Headache   . Heart murmur   . Peripheral vascular disease (HCC)    phlebitis  . Phlebitis   . PONV (postoperative nausea and vomiting)   . Skin cancer 03/2019   Right forearm BCC/SCC?    SURGICAL HISTORY: Past Surgical History:  Procedure Laterality Date  . ABDOMINAL HYSTERECTOMY    . ANKLE SURGERY    . APPENDECTOMY    . BACK SURGERY    . CATARACT EXTRACTION W/PHACO Left 02/13/2016   Procedure: CATARACT EXTRACTION PHACO AND INTRAOCULAR LENS PLACEMENT (IOC);  Surgeon: Leandrew Koyanagi, MD;  Location: [place];  Service: Ophthalmology;  Laterality: Left;  TORIC  . CATARACT EXTRACTION W/PHACO Right 02/27/2016   Procedure: CATARACT EXTRACTION PHACO AND INTRAOCULAR LENS PLACEMENT (IOC);  Surgeon: Leandrew Koyanagi, MD;  Location: [place];  Service: Ophthalmology;  Laterality: Right;  TORIC Latex sensitivity  . CERVICAL FUSION  1992  . CHOLECYSTECTOMY    . COLONOSCOPY    . COLONOSCOPY WITH PROPOFOL N/A 04/19/2018   Procedure: COLONOSCOPY WITH PROPOFOL;  Surgeon: Manya Silvas, MD;  Location: [facility];  Service: Endoscopy;  Laterality: N/A;  .  ESOPHAGOGASTRODUODENOSCOPY    . ESOPHAGOGASTRODUODENOSCOPY (EGD) WITH PROPOFOL N/A 04/19/2018   Procedure: ESOPHAGOGASTRODUODENOSCOPY (EGD) WITH PROPOFOL;  Surgeon: Manya Silvas, MD;  Location: [facility] ENDOSCOPY;  Service: Endoscopy;  Laterality:  N/A;  . feet surgery  2000  . FOOT SURGERY    . KNEE ARTHROSCOPY WITH MENISCAL REPAIR Left 10/01/2017   Procedure: KNEE ARTHROSCOPY PARTIAL AND LATERAL MENISCECTOMY AND CHONDROPLASTY;  Surgeon: Leim Fabry, MD;  Location: [place];  Service: Orthopedics;  Laterality: Left;  . right ankle surgery  1977  . SHOULDER ARTHROSCOPY      SOCIAL HISTORY: Social History   Socioeconomic History  . Marital status: Married    Spouse name: Not on file  . Number of children: Not on file  . Years of education: Not on file  . Highest education level: Not on file  Occupational History  . Not on file  Tobacco Use  . Smoking status: Former Smoker    Packs/day: 0.50    Years: 3.00    Pack years: 1.50    Types: Cigarettes    Quit date: 1980    Years since quitting: 42.0  . Smokeless tobacco: Never Used  . Tobacco comment: only smoked for about 3 years   Vaping Use  . Vaping Use: Never used  Substance and Sexual Activity  . Alcohol use: No  . Drug use: No  . Sexual activity: Not on file  Other Topics Concern  . Not on file  Social History Narrative  . Not on file   Social Determinants of Health   Financial Resource Strain: Not on file  Food Insecurity: Not on file  Transportation Needs: Not on file  Physical Activity: Not on file  Stress: Not on file  Social Connections: Not on file  Intimate Partner Violence: Not on file    FAMILY HISTORY: Family History  Problem Relation Age of Onset  . Breast cancer Maternal Aunt   . Diabetes Mother   . Heart disease Mother   . Prostate cancer Father   . Hypertension Father   . Breast cancer Cousin   . Colon cancer Cousin   . Breast cancer Cousin   . Lung cancer Maternal Aunt     ALLERGIES:  is allergic to aspirin, ivp dye [iodinated diagnostic agents], nsaids, and latex.  MEDICATIONS:  Current Outpatient Medications  Medication Sig Dispense Refill  . acetaminophen (TYLENOL) 325 MG tablet Take 1,000 mg by mouth in the  morning and at bedtime.    . calcium-vitamin D (OSCAL WITH D) 500-200 MG-UNIT tablet Take 1 tablet by mouth.    . Cyanocobalamin (B-12 COMPLIANCE INJECTION IJ) Inject as directed.    . docusate sodium (COLACE) 250 MG capsule Take 250 mg by mouth daily.    Marland Kitchen doxycycline (VIBRA-TABS) 100 MG tablet Take by mouth. Take 1 tablet (100 mg total) by mouth 2 (two) times daily for 10 days    . lansoprazole (PREVACID) 30 MG capsule Take 30 mg by mouth daily at 12 noon.    . nortriptyline (PAMELOR) 75 MG capsule Take 75 mg by mouth at bedtime.    . Potassium 95 MG TABS Take 1 tablet by mouth daily.    . promethazine-codeine (PHENERGAN WITH CODEINE) 6.25-10 MG/5ML syrup Take by mouth.    . sucralfate (CARAFATE) 1 g tablet Take 1 g by mouth 4 (four) times daily -  with meals and at bedtime.    . topiramate (TOPAMAX) 100 MG tablet Take 50 mg by mouth 2 (two)  times daily.    . vitamin E 180 MG (400 UNITS) capsule Take by mouth.    Marland Kitchen apixaban (ELIQUIS) 5 MG TABS tablet Take 1 tablet (5 mg total) by mouth 2 (two) times daily for 14 days. (Patient not taking: Reported on 03/16/2020) 14 tablet 0  . cephALEXin (KEFLEX) 250 MG capsule Take 1 capsule (250 mg total) by mouth 3 (three) times daily. (Patient not taking: Reported on 03/16/2020) 21 capsule 0   No current facility-administered medications for this visit.     PHYSICAL EXAMINATION: ECOG PERFORMANCE STATUS: 0 - Asymptomatic Vitals:   03/16/20 1117  BP: 124/67  Pulse: 84  Resp: 18  Temp: (!) 96 F (35.6 C)   Filed Weights   03/16/20 1117  Weight: 181 lb (82.1 kg)   Physical Exam Constitutional:      General: She is not in acute distress.    Appearance: She is not diaphoretic.  HENT:     Head: Normocephalic and atraumatic.     Nose: Nose normal.     Mouth/Throat:     Mouth: Oropharynx is clear and moist.     Pharynx: No oropharyngeal exudate.  Eyes:     General: No scleral icterus.    Extraocular Movements: EOM normal.     Pupils: Pupils are  equal, round, and reactive to light.  Cardiovascular:     Rate and Rhythm: Normal rate and regular rhythm.     Heart sounds: No murmur heard.   Pulmonary:     Effort: Pulmonary effort is normal. No respiratory distress.     Breath sounds: No rales.  Chest:     Chest wall: No tenderness.  Abdominal:     General: There is no distension.     Palpations: Abdomen is soft.     Tenderness: There is no abdominal tenderness.  Musculoskeletal:        General: No edema. Normal range of motion.     Cervical back: Normal range of motion and neck supple.  Skin:    General: Skin is warm and dry.     Findings: No erythema.     Comments: Bilateral lower extremity blanchable purple skin discoloration  Neurological:     Mental Status: She is alert and oriented to person, place, and time.     Cranial Nerves: No cranial nerve deficit.     Motor: No abnormal muscle tone.     Coordination: Coordination normal.  Psychiatric:        Mood and Affect: Affect normal.        LABORATORY DATA:  I have reviewed the data as listed Lab Results  Component Value Date   WBC 8.1 03/16/2020   HGB 13.5 03/16/2020   HCT 39.6 03/16/2020   MCV 100.3 (H) 03/16/2020   PLT 268 03/16/2020   Recent Labs    03/16/20 1204  NA 136  K 3.6  CL 104  CO2 24  GLUCOSE 89  BUN 29*  CREATININE 0.83  CALCIUM 9.0  GFRNONAA >60  PROT 7.2  ALBUMIN 3.9  AST 14*  ALT 22  ALKPHOS 54  BILITOT 0.2*   Iron/TIBC/Ferritin/ %Sat No results found for: IRON, TIBC, FERRITIN, IRONPCTSAT    RADIOGRAPHIC STUDIES: I have personally reviewed the radiological images as listed and agreed with the findings in the report. No results found.    ASSESSMENT & PLAN:  1. Macrocytosis   2. Bluish skin discoloration    #Macrocytosis, chronic, no anemia. Check vitamin B12, folate, LDH, multiple  myeloma panel, reticulocyte panel Peripheral smear #Bilateral lower extremity bluish skin discoloration This is a chronic condition  for her. ?Acrocyanosis, vein insufficiency, ?  Vasculitis I will check viscosity, cryoglobulin   Orders Placed This Encounter  Procedures  . CBC with Differential/Platelet    Standing Status:   Future    Number of Occurrences:   1    Standing Expiration Date:   03/16/2021  . Vitamin B12    Standing Status:   Future    Number of Occurrences:   1    Standing Expiration Date:   03/16/2021  . Folate    Standing Status:   Future    Number of Occurrences:   1    Standing Expiration Date:   03/16/2021  . Retic Panel    Standing Status:   Future    Number of Occurrences:   1    Standing Expiration Date:   03/16/2021  . Multiple Myeloma Panel (SPEP&IFE w/QIG)    Standing Status:   Future    Number of Occurrences:   1    Standing Expiration Date:   03/16/2021  . Kappa/lambda light chains    Standing Status:   Future    Number of Occurrences:   1    Standing Expiration Date:   03/16/2021  . Lactate dehydrogenase    Standing Status:   Future    Number of Occurrences:   1    Standing Expiration Date:   03/16/2021  . Comprehensive metabolic panel    Standing Status:   Future    Number of Occurrences:   1    Standing Expiration Date:   03/16/2021  . Pathologist smear review    Standing Status:   Future    Number of Occurrences:   1    Standing Expiration Date:   03/16/2021  . Viscosity, serum    Standing Status:   Future    Number of Occurrences:   1    Standing Expiration Date:   03/16/2021  . ANA w/Reflex    Standing Status:   Future    Number of Occurrences:   1    Standing Expiration Date:   03/16/2021  . Cryoglobulin    Standing Status:   Future    Number of Occurrences:   1    Standing Expiration Date:   03/16/2021    All questions were answered. The patient knows to call the clinic with any problems questions or concerns.  cc Marinda Elk, MD    Return of visit: Follow-up in 2 weeks to discuss results. Thank you for this kind referral and the opportunity to participate in the  care of this patient. A copy of today's note is routed to referring provider    Earlie Server, MD, PhD Hematology Oncology [facility] at [facility] Pager- [phone] 03/16/2020

## 2020-03-16 NOTE — Progress Notes (Signed)
Pt here to establish care.

## 2020-03-17 LAB — ANA W/REFLEX: Anti Nuclear Antibody (ANA): NEGATIVE

## 2020-03-19 LAB — MULTIPLE MYELOMA PANEL, SERUM
Albumin SerPl Elph-Mcnc: 3.6 g/dL (ref 2.9–4.4)
Albumin/Glob SerPl: 1.3 (ref 0.7–1.7)
Alpha 1: 0.2 g/dL (ref 0.0–0.4)
Alpha2 Glob SerPl Elph-Mcnc: 0.8 g/dL (ref 0.4–1.0)
B-Globulin SerPl Elph-Mcnc: 0.9 g/dL (ref 0.7–1.3)
Gamma Glob SerPl Elph-Mcnc: 0.9 g/dL (ref 0.4–1.8)
Globulin, Total: 2.9 g/dL (ref 2.2–3.9)
IgA: 202 mg/dL (ref 64–422)
IgG (Immunoglobin G), Serum: 942 mg/dL (ref 586–1602)
IgM (Immunoglobulin M), Srm: 106 mg/dL (ref 26–217)
Total Protein ELP: 6.5 g/dL (ref 6.0–8.5)

## 2020-03-19 LAB — KAPPA/LAMBDA LIGHT CHAINS
Kappa free light chain: 35.7 mg/L — ABNORMAL HIGH (ref 3.3–19.4)
Kappa, lambda light chain ratio: 1.37 (ref 0.26–1.65)
Lambda free light chains: 26 mg/L (ref 5.7–26.3)

## 2020-03-21 ENCOUNTER — Other Ambulatory Visit: Payer: Self-pay

## 2020-03-21 ENCOUNTER — Ambulatory Visit
Admission: RE | Admit: 2020-03-21 | Discharge: 2020-03-21 | Disposition: A | Discharge status: Home / Self Care | Payer: Medicare Other | Source: Ambulatory Visit | Attending: Physician Assistant | Admitting: Physician Assistant

## 2020-03-21 DIAGNOSIS — N63 Unspecified lump in unspecified breast: Secondary | ICD-10-CM

## 2020-03-21 DIAGNOSIS — N6325 Unspecified lump in the left breast, overlapping quadrants: Secondary | ICD-10-CM | POA: Insufficient documentation

## 2020-03-21 LAB — CRYOGLOBULIN

## 2020-03-22 LAB — VISCOSITY, SERUM: Viscosity, Serum: 1.7 rel.saline (ref 1.4–2.1)

## 2020-03-27 ENCOUNTER — Ambulatory Visit: Payer: Medicare Other | Admitting: Dermatology

## 2020-03-30 ENCOUNTER — Inpatient Hospital Stay: Payer: Medicare Other | Admitting: Oncology

## 2020-03-30 ENCOUNTER — Encounter: Payer: Self-pay | Admitting: Oncology

## 2020-03-30 VITALS — BP 113/54 | HR 79 | Temp 97.7°F | Resp 18 | Wt 179.2 lb

## 2020-03-30 DIAGNOSIS — D7589 Other specified diseases of blood and blood-forming organs: Secondary | ICD-10-CM

## 2020-03-30 DIAGNOSIS — R230 Cyanosis: Secondary | ICD-10-CM | POA: Diagnosis not present

## 2020-03-30 NOTE — Progress Notes (Signed)
Hematology/Oncology Consult note [facility] Telephone:([phone] Fax:([phone]   Patient Care Team: Patrice Paradise, MD as PCP - General (Physician Assistant)  REFERRING PROVIDER: Patrice Paradise, MD  CHIEF COMPLAINTS/REASON FOR VISIT:  Evaluation of macrocytosis  HISTORY OF PRESENTING ILLNESS:   Jeanette Harris is a  75 y.o.  female with PMH listed below was seen in consultation at the request of  Patrice Paradise, MD  for evaluation of macrocytosis  Reviewed patient's blood work done chronic hepatitis obvious. 02/07/2020, CBC showed hemoglobin 12.8, MCV 104.5, WBC 4.4, decreased lymphocyte, increased eosinophil percentage.  Reviewed past lab work.  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  Folate and vitamin B12 level have been both as stated in past and within normal limits. Patient was referred to hematology oncology for further evaluation.  Patient reports chronic lower extremity peripheral for 10 to 20 years.  She was previously seen by vascular surgeon and was told that " this is too deep and nothing can be done:.  He reports bilateral toes numbness tingling, cold sensation. Denies weight loss, fever, chills, fatigue, night sweats.  Patient was accompanied by her sister.  Patient has migraine and is on Topamax, chronically.  Recent COVID-19 infection.  Today her symptom has resolved.  INTERVAL HISTORY Jeanette Harris is a 75 y.o. female who has above history reviewed by me today presents for follow up visit for management of macrocytosis Problems and complaints are listed below:  She had blood work done recently and presents to discuss results. No new complaints.   Review of Systems  Constitutional: Negative for appetite change, chills, fatigue and fever.  HENT:   Negative for hearing loss and voice change.   Eyes: Negative for eye problems.  Respiratory: Negative for chest  tightness and cough.   Cardiovascular: Negative for chest pain.  Gastrointestinal: Negative for abdominal distention, abdominal pain and blood in stool.  Endocrine: Negative for hot flashes.  Genitourinary: Negative for difficulty urinating and frequency.   Musculoskeletal: Negative for arthralgias.  Skin: Negative for itching and rash.       Lower extremity purpura  Neurological: Negative for extremity weakness.  Hematological: Negative for adenopathy.  Psychiatric/Behavioral: Negative for confusion.     MEDICAL HISTORY:  Past Medical History:  Diagnosis Date  . Arthritis   . Basal cell carcinoma 12/28/2019   R upper buttock  . Complication of anesthesia   . COPD (chronic obstructive pulmonary disease) ([facility])    patient denies  . DVT (deep venous thrombosis) ([facility]) 04/01/2017   left lower leg  . GERD (gastroesophageal reflux disease)   . Headache   . Heart murmur   . Peripheral vascular disease ([facility])    phlebitis  . Phlebitis   . PONV (postoperative nausea and vomiting)   . Skin cancer 03/2019   Right forearm BCC/SCC?    SURGICAL HISTORY: Past Surgical History:  Procedure Laterality Date  . ABDOMINAL HYSTERECTOMY    . ANKLE SURGERY    . APPENDECTOMY    . BACK SURGERY    . CATARACT EXTRACTION W/PHACO Left 02/13/2016   Procedure: CATARACT EXTRACTION PHACO AND INTRAOCULAR LENS PLACEMENT (IOC);  Surgeon: Lockie Mola, MD;  Location: [facility];  Service: Ophthalmology;  Laterality: Left;  TORIC  . CATARACT EXTRACTION W/PHACO Right 02/27/2016   Procedure: CATARACT EXTRACTION PHACO AND INTRAOCULAR LENS PLACEMENT (IOC);  Surgeon: Lockie Mola, MD;  Location: [facility];  Service: Ophthalmology;  Laterality: Right;  TORIC Latex sensitivity  . CERVICAL FUSION  1992  . CHOLECYSTECTOMY    . COLONOSCOPY    . COLONOSCOPY WITH PROPOFOL N/A 04/19/2018   Procedure: COLONOSCOPY WITH PROPOFOL;  Surgeon: Scot Jun, MD;  Location: [facility] END[facility];   Service: Endoscopy;  Laterality: N/A;  . ESOPHAGOGASTRODUODENOSCOPY    . ESOPHAGOGASTRODUODENOSCOPY (EGD) WITH PROPOFOL N/A 04/19/2018   Procedure: ESOPHAGOGASTRODUODENOSCOPY (EGD) WITH PROPOFOL;  Surgeon: Scot Jun, MD;  Location: [facility] END[facility]COPY;  Service: Endoscopy;  Laterality: N/A;  . feet surgery  2000  . FOOT SURGERY    . KNEE ARTHROSCOPY WITH MENISCAL REPAIR Left 10/01/2017   Procedure: KNEE ARTHROSCOPY PARTIAL AND LATERAL MENISCECTOMY AND CHONDROPLASTY;  Surgeon: Signa Kell, MD;  Location: [facility];  Service: Orthopedics;  Laterality: Left;  . right ankle surgery  1977  . SHOULDER ARTHROSCOPY      SOCIAL HISTORY: Social History   Socioeconomic History  . Marital status: Married    Spouse name: Not on file  . Number of children: Not on file  . Years of education: Not on file  . Highest education level: Not on file  Occupational History  . Not on file  Tobacco Use  . Smoking status: Former Smoker    Packs/day: 0.50    Years: 3.00    Pack years: 1.50    Types: Cigarettes    Quit date: 1980    Years since quitting: 42.0  . Smokeless tobacco: Never Used  . Tobacco comment: only smoked for about 3 years   Vaping Use  . Vaping Use: Never used  Substance and Sexual Activity  . Alcohol use: No  . Drug use: No  . Sexual activity: Not on file  Other Topics Concern  . Not on file  Social History Narrative  . Not on file   Social Determinants of Health   Financial Resource Strain: Not on file  Food Insecurity: Not on file  Transportation Needs: Not on file  Physical Activity: Not on file  Stress: Not on file  Social Connections: Not on file  Intimate Partner Violence: Not on file    FAMILY HISTORY: Family History  Problem Relation Age of Onset  . Breast cancer Maternal Aunt   . Diabetes Mother   . Heart disease Mother   . Prostate cancer Father   . Hypertension Father   . Breast cancer Cousin   . Colon cancer Cousin   . Breast cancer  Cousin   . Lung cancer Maternal Aunt     ALLERGIES:  is allergic to aspirin, ivp dye [iodinated diagnostic agents], nsaids, and latex.  MEDICATIONS:  Current Outpatient Medications  Medication Sig Dispense Refill  . acetaminophen (TYLENOL) 325 MG tablet Take 1,000 mg by mouth in the morning and at bedtime.    . calcium-vitamin D (OSCAL WITH D) 500-200 MG-UNIT tablet Take 1 tablet by mouth.    . Cyanocobalamin 2500 MCG CHEW Chew by mouth.    . docusate sodium (COLACE) 250 MG capsule Take 250 mg by mouth daily.    . lansoprazole (PREVACID) 30 MG capsule Take 30 mg by mouth daily at 12 noon.    . nortriptyline (PAMELOR) 75 MG capsule Take 75 mg by mouth at bedtime.    . Potassium 95 MG TABS Take 1 tablet by mouth daily.    . sucralfate (CARAFATE) 1 g tablet Take 1 g by mouth 4 (four) times daily -  with meals and at bedtime.    . topiramate (  TOPAMAX) 100 MG tablet Take 50 mg by mouth 2 (two) times daily.    . vitamin E 180 MG (400 UNITS) capsule Take by mouth.    Marland Kitchen apixaban (ELIQUIS) 5 MG TABS tablet Take 1 tablet (5 mg total) by mouth 2 (two) times daily for 14 days. (Patient not taking: Reported on 03/16/2020) 14 tablet 0  . Cyanocobalamin (B-12 COMPLIANCE INJECTION IJ) Inject as directed. (Patient not taking: Reported on 03/30/2020)     No current facility-administered medications for this visit.     PHYSICAL EXAMINATION: ECOG PERFORMANCE STATUS: 0 - Asymptomatic Vitals:   03/30/20 1053  BP: (!) 113/54  Pulse: 79  Resp: 18  Temp: 97.7 F (36.5 C)   Filed Weights   03/30/20 1053  Weight: 179 lb 3.2 oz (81.3 kg)   Physical Exam Constitutional:      General: She is not in acute distress.    Appearance: She is not diaphoretic.  HENT:     Head: Normocephalic and atraumatic.     Nose: Nose normal.     Mouth/Throat:     Pharynx: No oropharyngeal exudate.  Eyes:     General: No scleral icterus.    Pupils: Pupils are equal, round, and reactive to light.  Cardiovascular:      Rate and Rhythm: Normal rate and regular rhythm.     Heart sounds: No murmur heard.   Pulmonary:     Effort: Pulmonary effort is normal. No respiratory distress.     Breath sounds: No rales.  Chest:     Chest wall: No tenderness.  Abdominal:     General: There is no distension.     Palpations: Abdomen is soft.     Tenderness: There is no abdominal tenderness.  Musculoskeletal:        General: Normal range of motion.     Cervical back: Normal range of motion and neck supple.  Skin:    General: Skin is warm and dry.     Findings: No erythema.     Comments: Bilateral lower extremity blanchable purple skin discoloration  Neurological:     Mental Status: She is alert and oriented to person, place, and time.     Cranial Nerves: No cranial nerve deficit.     Motor: No abnormal muscle tone.     Coordination: Coordination normal.  Psychiatric:        Mood and Affect: Affect normal.        LABORATORY DATA:  I have reviewed the data as listed Lab Results  Component Value Date   WBC 8.1 03/16/2020   HGB 13.5 03/16/2020   HCT 39.6 03/16/2020   MCV 100.3 (H) 03/16/2020   PLT 268 03/16/2020   Recent Labs    03/16/20 1204  NA 136  K 3.6  CL 104  CO2 24  GLUCOSE 89  BUN 29*  CREATININE 0.83  CALCIUM 9.0  GFRNONAA >60  PROT 7.2  ALBUMIN 3.9  AST 14*  ALT 22  ALKPHOS 54  BILITOT 0.2*   Iron/TIBC/Ferritin/ %Sat No results found for: IRON, TIBC, FERRITIN, IRONPCTSAT    RADIOGRAPHIC STUDIES: I have personally reviewed the radiological images as listed and agreed with the findings in the report. MM DIAG BREAST TOMO BILATERAL  Result Date: 03/21/2020 CLINICAL DATA:  Palpable lump/focal pain left breast EXAM: DIGITAL DIAGNOSTIC bilateral MAMMOGRAM WITH CAD AND TOMO ULTRASOUND left BREAST COMPARISON:  Prior films ACR Breast Density Category b: There are scattered areas of fibroglandular density. FINDINGS: Cc and  MLO views of bilateral breasts, spot compression left cc view  are submitted. There is questioned asymmetry in the posterolateral left breast which does not persist on additional views. No suspicious abnormality is identified bilaterally. Mammographic images were processed with CAD. Targeted ultrasound is performed, showing no focal abnormal discrete cystic or solid lesion in the palpable/painful area left breast 12 o'clock and 1 o'clock. IMPRESSION: Negative. RECOMMENDATION: Routine screening mammogram in 1 year. I have discussed the findings and recommendations with the patient. If applicable, a reminder letter will be sent to the patient regarding the next appointment. BI-RADS CATEGORY  1: Negative. Electronically Signed   By: Abelardo Diesel M.D.   On: 03/21/2020 10:13      ASSESSMENT & PLAN:  1. Macrocytosis   2. Bluish skin discoloration    #Macrocytosis, chronic, no anemia. Labs are reviewed and discussed with patient. Adequate B12 and folate level.  Normal LDH, SPEP showed no M protein, normal light chain ratio.  Smear showed macrocytosis, no wbc plt abnormal morphology.  Possible from chronic use of PPI.  Discussed that sometime, underlying bone marrow disorders may cause macroytosis.  For now she has no cytopenia and clinically is doing well.  Recommend observation.   #Bilateral lower extremity bluish skin discoloration This is a chronic condition for her. Normal ANA, viscosity, negative cryoglobulin. Observe. Recommend compression stocking.  Follow up with vascular surgery.    Orders Placed This Encounter  Procedures  . CBC with Differential/Platelet    Standing Status:   Future    Standing Expiration Date:   03/30/2021  . Comprehensive metabolic panel    Standing Status:   Future    Standing Expiration Date:   03/30/2021  . Technologist smear review    Standing Status:   Future    Standing Expiration Date:   03/30/2021    All questions were answered. The patient knows to call the clinic with any problems questions or  concerns.  cc Marinda Elk, MD    Return of visit: 1 year Thank you for this kind referral and the opportunity to participate in the care of this patient. A copy of today's note is routed to referring provider    Earlie Server, MD, PhD Hematology Oncology [facility] at [facility] Pager- [phone] 03/30/2020

## 2020-03-30 NOTE — Progress Notes (Signed)
Patient here for follow up. No new concerns voiced.  °

## 2020-04-09 ENCOUNTER — Ambulatory Visit: Payer: Medicare Other | Admitting: Dermatology

## 2020-04-09 ENCOUNTER — Other Ambulatory Visit: Payer: Self-pay

## 2020-04-09 DIAGNOSIS — L814 Other melanin hyperpigmentation: Secondary | ICD-10-CM | POA: Diagnosis not present

## 2020-04-09 DIAGNOSIS — L821 Other seborrheic keratosis: Secondary | ICD-10-CM | POA: Diagnosis not present

## 2020-04-09 DIAGNOSIS — Z85828 Personal history of other malignant neoplasm of skin: Secondary | ICD-10-CM

## 2020-04-09 DIAGNOSIS — I8310 Varicose veins of unspecified lower extremity with inflammation: Secondary | ICD-10-CM | POA: Diagnosis not present

## 2020-04-09 NOTE — Progress Notes (Signed)
   Follow-Up Visit   Subjective  Jeanette Harris is a 75 y.o. female who presents for the following: Hx of BCC (R upper buttock, edc 01/25/20 recheck today), hx of ISK (R inf buttock, LN1 01/25/20 recheck today), and check spot (L lower leg).   The following portions of the chart were reviewed this encounter and updated as appropriate:       Review of Systems:  No other skin or systemic complaints except as noted in HPI or Assessment and Plan.  Objective  Well appearing patient in no apparent distress; mood and affect are within normal limits.  A focused examination was performed including R buttock, arms, legs. Relevant physical exam findings are noted in the Assessment and Plan.  Objective  Right upper buttocks: Well healed pink round scar with no evidence of recurrence.   Objective  Left lower pretibial: 5.27mm waxy tan pap   Assessment & Plan    Seborrheic Keratoses - Stuck-on, waxy, tan-brown papules  - previously treated ISK R inf buttock is clear - Discussed benign etiology and prognosis. - Observe - Call for any changes  Lentigines - Scattered tan macules - Discussed due to sun exposure - Benign, observe - Recommend daily broad spectrum sunscreen SPF 30+ to sun-exposed areas, reapply every 2 hours as needed. - Call for any changes  Varicose Veins - Dilated blue, purple or red veins at the lower extremities - Reassured - These can be treated by sclerotherapy (a procedure to inject a medicine into the veins to make them disappear) if desired, but the treatment is not covered by insurance   History of basal cell carcinoma (BCC) Right upper buttocks  Clear. Observe for recurrence. Call clinic for new or changing lesions.  Recommend regular skin exams, daily broad-spectrum spf 30+ sunscreen use, and photoprotection.     Seborrheic keratosis Left lower pretibial  Benign Appearing, observe  Return in about 6 months (around 10/07/2020) for TBSE, Hx of  BCC.  I, Othelia Pulling, RMA, am acting as scribe for Brendolyn Patty, MD . Documentation: I have reviewed the above documentation for accuracy and completeness, and I agree with the above.  Brendolyn Patty MD

## 2020-04-09 NOTE — Patient Instructions (Signed)

## 2020-06-04 ENCOUNTER — Encounter (INDEPENDENT_AMBULATORY_CARE_PROVIDER_SITE_OTHER): Payer: Self-pay | Admitting: Nurse Practitioner

## 2020-06-04 ENCOUNTER — Other Ambulatory Visit: Payer: Self-pay

## 2020-06-04 ENCOUNTER — Ambulatory Visit (INDEPENDENT_AMBULATORY_CARE_PROVIDER_SITE_OTHER): Payer: Medicare Other | Admitting: Nurse Practitioner

## 2020-06-04 VITALS — BP 113/75 | HR 81 | Ht 66.0 in | Wt 179.0 lb

## 2020-06-04 DIAGNOSIS — M722 Plantar fascial fibromatosis: Secondary | ICD-10-CM | POA: Diagnosis not present

## 2020-06-04 DIAGNOSIS — M79605 Pain in left leg: Secondary | ICD-10-CM

## 2020-06-04 DIAGNOSIS — M1990 Unspecified osteoarthritis, unspecified site: Secondary | ICD-10-CM | POA: Insufficient documentation

## 2020-06-04 DIAGNOSIS — J302 Other seasonal allergic rhinitis: Secondary | ICD-10-CM | POA: Insufficient documentation

## 2020-06-04 DIAGNOSIS — G43909 Migraine, unspecified, not intractable, without status migrainosus: Secondary | ICD-10-CM | POA: Insufficient documentation

## 2020-06-04 DIAGNOSIS — G629 Polyneuropathy, unspecified: Secondary | ICD-10-CM | POA: Diagnosis not present

## 2020-06-04 DIAGNOSIS — M79604 Pain in right leg: Secondary | ICD-10-CM | POA: Diagnosis not present

## 2020-06-04 DIAGNOSIS — G8929 Other chronic pain: Secondary | ICD-10-CM | POA: Insufficient documentation

## 2020-06-04 DIAGNOSIS — L819 Disorder of pigmentation, unspecified: Secondary | ICD-10-CM

## 2020-06-04 NOTE — Progress Notes (Signed)
Subjective:    Patient ID: Jeanette Harris, female    DOB: Jan 16, 1946, 75 y.o.   MRN: [id number] Chief Complaint  Patient presents with  . New Patient (Initial Visit)    [place] venous reflux/ leg pain    Jeanette Harris is a 75 year old female that presents today for evaluation of bilateral lower extremity discoloration as well as pain and discomfort in her lower extremities.  She notes this is been ongoing for some time now.  However the last few months it seems to have gotten worse.  The patient also notes having burning stinging and numbness in her lower extremities with pain in her heel at times.  She denies any wounds or ulcerations.  She denies any fever or chills.  She does have a previous history of DVT in the left lower extremity.  She denies any fever or chills.   Review of Systems  Cardiovascular: Positive for leg swelling.  Skin: Positive for color change.  All other systems reviewed and are negative.      Objective:   Physical Exam Vitals reviewed.  HENT:     Head: Normocephalic.  Cardiovascular:     Rate and Rhythm: Normal rate and regular rhythm.     Pulses:          Dorsalis pedis pulses are 1+ on the right side and 1+ on the left side.       Posterior tibial pulses are 1+ on the right side and 1+ on the left side.  Pulmonary:     Effort: Pulmonary effort is normal.  Skin:    General: Skin is warm and dry.     Capillary Refill: Capillary refill takes 2 to 3 seconds.  Neurological:     Mental Status: She is alert and oriented to person, place, and time.  Psychiatric:        Mood and Affect: Mood normal.        Behavior: Behavior normal.        Thought Content: Thought content normal.        Judgment: Judgment normal.     BP 113/75   Pulse 81   Ht 5\' 6"  (1.676 m)   Wt 179 lb (81.2 kg)   BMI 28.89 kg/m   Past Medical History:  Diagnosis Date  . Arthritis   . Basal cell carcinoma 12/28/2019   R upper buttock  . Complication of anesthesia   . COPD  (chronic obstructive pulmonary disease) ([place])    patient denies  . DVT (deep venous thrombosis) ([place]) 04/01/2017   left lower leg  . GERD (gastroesophageal reflux disease)   . Headache   . Heart murmur   . Peripheral vascular disease ([facility])    phlebitis  . Phlebitis   . PONV (postoperative nausea and vomiting)   . Skin cancer 03/2019   Right forearm BCC/SCC?    Social History   Socioeconomic History  . Marital status: Married    Spouse name: Not on file  . Number of children: Not on file  . Years of education: Not on file  . Highest education level: Not on file  Occupational History  . Not on file  Tobacco Use  . Smoking status: Former Smoker    Packs/day: 0.50    Years: 3.00    Pack years: 1.50    Types: Cigarettes    Quit date: 1980    Years since quitting: 42.2  . Smokeless tobacco: Never Used  . Tobacco comment: only smoked  for about 3 years   Vaping Use  . Vaping Use: Never used  Substance and Sexual Activity  . Alcohol use: No  . Drug use: No  . Sexual activity: Not on file  Other Topics Concern  . Not on file  Social History Narrative  . Not on file   Social Determinants of Health   Financial Resource Strain: Not on file  Food Insecurity: Not on file  Transportation Needs: Not on file  Physical Activity: Not on file  Stress: Not on file  Social Connections: Not on file  Intimate Partner Violence: Not on file    Past Surgical History:  Procedure Laterality Date  . ABDOMINAL HYSTERECTOMY    . ANKLE SURGERY    . APPENDECTOMY    . BACK SURGERY    . CATARACT EXTRACTION W/PHACO Left 02/13/2016   Procedure: CATARACT EXTRACTION PHACO AND INTRAOCULAR LENS PLACEMENT (IOC);  Surgeon: Leandrew Koyanagi, MD;  Location: [place];  Service: Ophthalmology;  Laterality: Left;  TORIC  . CATARACT EXTRACTION W/PHACO Right 02/27/2016   Procedure: CATARACT EXTRACTION PHACO AND INTRAOCULAR LENS PLACEMENT (IOC);  Surgeon: Leandrew Koyanagi, MD;  Location:  [place];  Service: Ophthalmology;  Laterality: Right;  TORIC Latex sensitivity  . CERVICAL FUSION  1992  . CHOLECYSTECTOMY    . COLONOSCOPY    . COLONOSCOPY WITH PROPOFOL N/A 04/19/2018   Procedure: COLONOSCOPY WITH PROPOFOL;  Surgeon: Manya Silvas, MD;  Location: [facility] ENDOSCOPY;  Service: Endoscopy;  Laterality: N/A;  . ESOPHAGOGASTRODUODENOSCOPY    . ESOPHAGOGASTRODUODENOSCOPY (EGD) WITH PROPOFOL N/A 04/19/2018   Procedure: ESOPHAGOGASTRODUODENOSCOPY (EGD) WITH PROPOFOL;  Surgeon: Manya Silvas, MD;  Location[facility] END[facility]COPY;  Service: Endoscopy;  Laterality: N/A;  . feet surgery  2000  . FOOT SURGERY    . KNEE ARTHROSCOPY WITH MENISCAL REPAIR Left 10/01/2017   Procedure: KNEE ARTHROSCOPY PARTIAL AND LATERAL MENISCECTOMY AND CHONDROPLASTY;  Surgeon: Leim Fabry, MD;  Location: [place];  Service: Orthopedics;  Laterality: Left;  . right ankle surgery  1977  . SHOULDER ARTHROSCOPY      Family History  Problem Relation Age of Onset  . Breast cancer Maternal Aunt   . Diabetes Mother   . Heart disease Mother   . Prostate cancer Father   . Hypertension Father   . Breast cancer Cousin   . Colon cancer Cousin   . Breast cancer Cousin   . Lung cancer Maternal Aunt     Allergies  Allergen Reactions  . Aspirin Other (See Comments)    unknown  . Ivp Dye [Iodinated Diagnostic Agents] Nausea And Vomiting  . Nsaids Other (See Comments)    Unknown  . Latex Rash    CBC Latest Ref Rng & Units 03/16/2020 02/12/2015 04/07/2012  WBC 4.0 - 10.5 K/uL 8.1 5.0 12.6(H)  Hemoglobin 12.0 - 15.0 g/dL 13.5 13.0 13.1  Hematocrit 36.0 - 46.0 % 39.6 38.3 38.9  Platelets 150 - 400 K/uL 268 247 254      CMP     Component Value Date/Time   NA 136 03/16/2020 1204   NA 141 04/07/2012 0214   K 3.6 03/16/2020 1204   K 4.3 04/07/2012 0214   CL 104 03/16/2020 1204   CL 106 04/07/2012 0214   CO2 24 03/16/2020 1204   CO2 25 04/07/2012 0214   GLUCOSE 89 03/16/2020 1204    GLUCOSE 137 (H) 04/07/2012 0214   BUN 29 (H) 03/16/2020 1204   BUN 16 04/07/2012 0214   CREATININE 0.83  03/16/2020 1204   CREATININE 0.95 04/07/2012 0214   CALCIUM 9.0 03/16/2020 1204   CALCIUM 9.2 04/07/2012 0214   PROT 7.2 03/16/2020 1204   PROT 7.9 04/07/2012 0214   ALBUMIN 3.9 03/16/2020 1204   ALBUMIN 3.9 04/07/2012 0214   AST 14 (L) 03/16/2020 1204   AST 92 (H) 04/07/2012 0214   ALT 22 03/16/2020 1204   ALT 110 (H) 04/07/2012 0214   ALKPHOS 54 03/16/2020 1204   ALKPHOS 142 (H) 04/07/2012 0214   BILITOT 0.2 (L) 03/16/2020 1204   BILITOT 0.3 04/07/2012 0214   GFRNONAA >60 03/16/2020 1204   GFRNONAA >60 04/07/2012 0214   GFRAA >60 02/12/2015 1341   GFRAA >60 04/07/2012 0214     No results found.     Assessment & Plan:   1. Pain in both lower extremities  Recommend:  The patient has atypical pain symptoms for pure atherosclerotic disease. However, on physical exam there is evidence of mixed venous and arterial disease, given the diminished pulses and the edema associated with venous changes of the legs.  Noninvasive studies including ABI's and venous ultrasound of the legs will be obtained and the patient will follow up with me to review these studies.  I suspect the patient is c/o pseudoclaudication.  Patient should have an evaluation of his LS spine which I defer to the primary service.  The patient should continue walking and begin a more formal exercise program. The patient should continue his antiplatelet therapy and aggressive treatment of the lipid abnormalities.  The patient should begin wearing graduated compression socks 15-20 mmHg strength to control edema.  - VAS [place] ABI WITH/WO TBI; Future - VAS [place] LOWER EXTREMITY VENOUS REFLUX; Future  2. Peripheral polyneuropathy The patient's burning, stinging and numbness is likely related to her known neuropathy.  3. Discoloration of skin of foot It is possible that discoloration of feet is related to venous  insufficiency.  As noted above we will obtain a bilateral lower extremity venous insufficiency.  We will also be assessing for possible microvascular causes of discoloration.  4. Plantar fasciitis The patient's heel pain may be related to her plantar fasciitis.   Current Outpatient Medications on File Prior to Visit  Medication Sig Dispense Refill  . acetaminophen (TYLENOL) 325 MG tablet Take 1,000 mg by mouth in the morning and at bedtime.    . calcium-vitamin D (OSCAL WITH D) 500-200 MG-UNIT tablet Take 1 tablet by mouth.    . docusate sodium (COLACE) 250 MG capsule Take 250 mg by mouth daily.    . lansoprazole (PREVACID) 30 MG capsule Take 30 mg by mouth daily at 12 noon.    . nortriptyline (PAMELOR) 75 MG capsule Take 75 mg by mouth at bedtime.    . Potassium 95 MG TABS Take 1 tablet by mouth daily.    . sucralfate (CARAFATE) 1 g tablet Take 1 g by mouth 4 (four) times daily -  with meals and at bedtime.    . topiramate (TOPAMAX) 100 MG tablet Take 50 mg by mouth 2 (two) times daily.    . vitamin E 180 MG (400 UNITS) capsule Take by mouth.    Marland Kitchen apixaban (ELIQUIS) 5 MG TABS tablet Take 1 tablet (5 mg total) by mouth 2 (two) times daily for 14 days. (Patient not taking: Reported on 03/16/2020) 14 tablet 0  . Cyanocobalamin (B-12 COMPLIANCE INJECTION IJ) Inject as directed. (Patient not taking: No sig reported)    . Cyanocobalamin 2500 MCG CHEW Chew by  mouth. (Patient not taking: Reported on 06/04/2020)     No current facility-administered medications on file prior to visit.    There are no Patient Instructions on file for this visit. No follow-ups on file.   Kris Hartmann, NP

## 2020-06-25 ENCOUNTER — Ambulatory Visit (INDEPENDENT_AMBULATORY_CARE_PROVIDER_SITE_OTHER): Payer: Medicare Other

## 2020-06-25 ENCOUNTER — Encounter (INDEPENDENT_AMBULATORY_CARE_PROVIDER_SITE_OTHER): Payer: Self-pay | Admitting: Vascular Surgery

## 2020-06-25 ENCOUNTER — Other Ambulatory Visit: Payer: Self-pay

## 2020-06-25 ENCOUNTER — Ambulatory Visit (INDEPENDENT_AMBULATORY_CARE_PROVIDER_SITE_OTHER): Payer: Medicare Other | Admitting: Vascular Surgery

## 2020-06-25 VITALS — BP 135/74 | HR 81 | Resp 16 | Wt 177.8 lb

## 2020-06-25 DIAGNOSIS — Z86718 Personal history of other venous thrombosis and embolism: Secondary | ICD-10-CM | POA: Diagnosis not present

## 2020-06-25 DIAGNOSIS — E782 Mixed hyperlipidemia: Secondary | ICD-10-CM | POA: Diagnosis not present

## 2020-06-25 DIAGNOSIS — I890 Lymphedema, not elsewhere classified: Secondary | ICD-10-CM

## 2020-06-25 DIAGNOSIS — M5137 Other intervertebral disc degeneration, lumbosacral region: Secondary | ICD-10-CM | POA: Diagnosis not present

## 2020-06-25 DIAGNOSIS — M79604 Pain in right leg: Secondary | ICD-10-CM

## 2020-06-25 DIAGNOSIS — M79605 Pain in left leg: Secondary | ICD-10-CM

## 2020-06-27 ENCOUNTER — Encounter (INDEPENDENT_AMBULATORY_CARE_PROVIDER_SITE_OTHER): Payer: Self-pay | Admitting: Vascular Surgery

## 2020-06-27 DIAGNOSIS — I890 Lymphedema, not elsewhere classified: Secondary | ICD-10-CM | POA: Insufficient documentation

## 2020-06-27 NOTE — Progress Notes (Signed)
MRN : [id number]  Jeanette Harris is a 75 y.o. (07-Jan-1946) female who presents with chief complaint of  Chief Complaint  Patient presents with  . Follow-up    Ultrasound follow up  .  History of Present Illness: The patient returns to the office for followup evaluation regarding leg swelling.  The swelling has persisted and the pain associated with swelling continues. There have not been any interval development of a ulcerations or wounds.  Since the previous visit the patient has been wearing graduated compression stockings and has noted little if any improvement in the lymphedema. The patient has been using compression routinely morning until night.  The patient also states elevation during the day and exercise is being done too.   Current Meds  Medication Sig  . acetaminophen (TYLENOL) 325 MG tablet Take 1,000 mg by mouth in the morning and at bedtime.  . calcium-vitamin D (OSCAL WITH D) 500-200 MG-UNIT tablet Take 1 tablet by mouth.  . docusate sodium (COLACE) 250 MG capsule Take 250 mg by mouth daily.  . lansoprazole (PREVACID) 30 MG capsule Take 30 mg by mouth daily at 12 noon.  . nortriptyline (PAMELOR) 75 MG capsule Take 75 mg by mouth at bedtime.  . Potassium 95 MG TABS Take 1 tablet by mouth daily.  . sucralfate (CARAFATE) 1 g tablet Take 1 g by mouth 4 (four) times daily -  with meals and at bedtime.  . topiramate (TOPAMAX) 100 MG tablet Take 50 mg by mouth 2 (two) times daily.  . vitamin E 180 MG (400 UNITS) capsule Take by mouth.    Past Medical History:  Diagnosis Date  . Arthritis   . Basal cell carcinoma 12/28/2019   R upper buttock  . Complication of anesthesia   . COPD (chronic obstructive pulmonary disease) ([place])    patient denies  . DVT (deep venous thrombosis) ([place]) 04/01/2017   left lower leg  . GERD (gastroesophageal reflux disease)   . Headache   . Heart murmur   . Peripheral vascular disease ([facility])    phlebitis  . Phlebitis   . PONV  (postoperative nausea and vomiting)   . Skin cancer 03/2019   Right forearm BCC/SCC?    Past Surgical History:  Procedure Laterality Date  . ABDOMINAL HYSTERECTOMY    . ANKLE SURGERY    . APPENDECTOMY    . BACK SURGERY    . CATARACT EXTRACTION W/PHACO Left 02/13/2016   Procedure: CATARACT EXTRACTION PHACO AND INTRAOCULAR LENS PLACEMENT (IOC);  Surgeon: Leandrew Koyanagi, MD;  Location: [place];  Service: Ophthalmology;  Laterality: Left;  TORIC  . CATARACT EXTRACTION W/PHACO Right 02/27/2016   Procedure: CATARACT EXTRACTION PHACO AND INTRAOCULAR LENS PLACEMENT (IOC);  Surgeon: Leandrew Koyanagi, MD;  Location: [place];  Service: Ophthalmology;  Laterality: Right;  TORIC Latex sensitivity  . CERVICAL FUSION  1992  . CHOLECYSTECTOMY    . COLONOSCOPY    . COLONOSCOPY WITH PROPOFOL N/A 04/19/2018   Procedure: COLONOSCOPY WITH PROPOFOL;  Surgeon: Manya Silvas, MD;  Location: [facility] ENDOSCOPY;  Service: Endoscopy;  Laterality: N/A;  . ESOPHAGOGASTRODUODENOSCOPY    . ESOPHAGOGASTRODUODENOSCOPY (EGD) WITH PROPOFOL N/A 04/19/2018   Procedure: ESOPHAGOGASTRODUODENOSCOPY (EGD) WITH PROPOFOL;  Surgeon: Manya Silvas, MD;  Location[facility] ENDOSCOPY;  Service: Endoscopy;  Laterality: N/A;  . feet surgery  2000  . FOOT SURGERY    . KNEE ARTHROSCOPY WITH MENISCAL REPAIR Left 10/01/2017   Procedure: KNEE ARTHROSCOPY PARTIAL AND LATERAL MENISCECTOMY AND CHONDROPLASTY;  Surgeon: Leim Fabry, MD;  Location: [place];  Service: Orthopedics;  Laterality: Left;  . right ankle surgery  1977  . SHOULDER ARTHROSCOPY      Social History Social History   Tobacco Use  . Smoking status: Former Smoker    Packs/day: 0.50    Years: 3.00    Pack years: 1.50    Types: Cigarettes    Quit date: 1980    Years since quitting: 42.3  . Smokeless tobacco: Never Used  . Tobacco comment: only smoked for about 3 years   Vaping Use  . Vaping Use: Never used  Substance Use  Topics  . Alcohol use: No  . Drug use: No    Family History Family History  Problem Relation Age of Onset  . Breast cancer Maternal Aunt   . Diabetes Mother   . Heart disease Mother   . Prostate cancer Father   . Hypertension Father   . Breast cancer Cousin   . Colon cancer Cousin   . Breast cancer Cousin   . Lung cancer Maternal Aunt     Allergies  Allergen Reactions  . Aspirin Other (See Comments)    unknown  . Ivp Dye [Iodinated Diagnostic Agents] Nausea And Vomiting  . Nsaids Other (See Comments)    Unknown  . Latex Rash     REVIEW OF SYSTEMS (Negative unless checked)  Constitutional: [] Weight loss  [] Fever  [] Chills Cardiac: [] Chest pain   [] Chest pressure   [] Palpitations   [] Shortness of breath when laying flat   [] Shortness of breath with exertion. Vascular:  [] Pain in legs with walking   [x] Pain in legs at rest  [] History of DVT   [] Phlebitis   [x] Swelling in legs   [] Varicose veins   [] Non-healing ulcers Pulmonary:   [] Uses home oxygen   [] Productive cough   [] Hemoptysis   [] Wheeze  [] COPD   [] Asthma Neurologic:  [] Dizziness   [] Seizures   [] History of stroke   [] History of TIA  [] Aphasia   [] Vissual changes   [] Weakness or numbness in arm   [] Weakness or numbness in leg Musculoskeletal:   [] Joint swelling   [] Joint pain   [] Low back pain Hematologic:  [] Easy bruising  [] Easy bleeding   [] Hypercoagulable state   [] Anemic Gastrointestinal:  [] Diarrhea   [] Vomiting  [x] Gastroesophageal reflux/heartburn   [] Difficulty swallowing. Genitourinary:  [] Chronic kidney disease   [] Difficult urination  [] Frequent urination   [] Blood in urine Skin:  [] Rashes   [] Ulcers  Psychological:  [] History of anxiety   []  History of major depression.  Physical Examination  Vitals:   06/25/20 1429  BP: 135/74  Pulse: 81  Resp: 16  Weight: 177 lb 12.8 oz (80.6 kg)   Body mass index is 28.7 kg/m. Gen: WD/WN, NAD Head: [place]/AT, No temporalis wasting.  Ear/Nose/Throat: Hearing  grossly intact, nares w/o erythema or drainage Eyes: PER, EOMI, sclera nonicteric.  Neck: Supple, no large masses.   Pulmonary:  Good air movement, no audible wheezing bilaterally, no use of accessory muscles.  Cardiac: RRR, no JVD Vascular: scattered varicosities present bilaterally.  Mild venous stasis changes to the legs bilaterally.  2+ soft pitting edema Vessel Right Left  Radial Palpable Palpable  PT Palpable Palpable  DP Palpable Palpable  Gastrointestinal: Non-distended. No guarding/no peritoneal signs.  Musculoskeletal: M/S 5/5 throughout.  No deformity or atrophy.  Neurologic: CN 2-12 intact. Symmetrical.  Speech is fluent. Motor exam as listed above. Psychiatric: Judgment intact, Mood & affect appropriate for pt's clinical  situation. Dermatologic: Mild rashes no ulcers noted.  No changes consistent with cellulitis. Lymph : + lichenification / skin changes of chronic lymphedema.  CBC Lab Results  Component Value Date   WBC 8.1 03/16/2020   HGB 13.5 03/16/2020   HCT 39.6 03/16/2020   MCV 100.3 (H) 03/16/2020   PLT 268 03/16/2020    BMET    Component Value Date/Time   NA 136 03/16/2020 1204   NA 141 04/07/2012 0214   K 3.6 03/16/2020 1204   K 4.3 04/07/2012 0214   CL 104 03/16/2020 1204   CL 106 04/07/2012 0214   CO2 24 03/16/2020 1204   CO2 25 04/07/2012 0214   GLUCOSE 89 03/16/2020 1204   GLUCOSE 137 (H) 04/07/2012 0214   BUN 29 (H) 03/16/2020 1204   BUN 16 04/07/2012 0214   CREATININE 0.83 03/16/2020 1204   CREATININE 0.95 04/07/2012 0214   CALCIUM 9.0 03/16/2020 1204   CALCIUM 9.2 04/07/2012 0214   GFRNONAA >60 03/16/2020 1204   GFRNONAA >60 04/07/2012 0214   GFRAA >60 02/12/2015 1341   GFRAA >60 04/07/2012 0214   CrCl cannot be calculated (Patient's most recent lab result is older than the maximum 21 days allowed.).  COAG No results found for: INR, PROTIME  Radiology VAS [place] ABI WITH/WO TBI  Result Date: 06/25/2020 LOWER EXTREMITY DOPPLER STUDY  Indications: Rest pain, and Discoloration in Feet.  Performing Technologist: Almira Coaster RVS  Examination Guidelines: A complete evaluation includes at minimum, Doppler waveform signals and systolic blood pressure reading at the level of bilateral brachial, anterior tibial, and posterior tibial arteries, when vessel segments are accessible. Bilateral testing is considered an integral part of a complete examination. Photoelectric Plethysmograph (PPG) waveforms and toe systolic pressure readings are included as required and additional duplex testing as needed. Limited examinations for reoccurring indications may be performed as noted.  ABI Findings: +---------+------------------+-----+---------+--------+ Right    Rt Pressure (mmHg)IndexWaveform Comment  +---------+------------------+-----+---------+--------+ Brachial 129                                      +---------+------------------+-----+---------+--------+ ATA      176               1.30 triphasic         +---------+------------------+-----+---------+--------+ PTA      175               1.30 triphasic         +---------+------------------+-----+---------+--------+ Great Toe122               0.90 Normal            +---------+------------------+-----+---------+--------+ +---------+------------------+-----+---------+-------+ Left     Lt Pressure (mmHg)IndexWaveform Comment +---------+------------------+-----+---------+-------+ Brachial 135                                     +---------+------------------+-----+---------+-------+ ATA      173               1.28 triphasic        +---------+------------------+-----+---------+-------+ PTA      184               1.36 triphasic        +---------+------------------+-----+---------+-------+ Great Toe142               1.05 Normal           +---------+------------------+-----+---------+-------+ +-------+-----------+-----------+------------+------------+  ABI/TBIToday's ABIToday's TBIPrevious ABIPrevious TBI +-------+-----------+-----------+------------+------------+ Right  1.30       .90                                 +-------+-----------+-----------+------------+------------+ Left   1.36       1.05                                +-------+-----------+-----------+------------+------------+  Summary: Right: Resting right ankle-brachial index is within normal range. No evidence of significant right lower extremity arterial disease. The right toe-brachial index is normal. Left: Resting left ankle-brachial index is within normal range. No evidence of significant left lower extremity arterial disease. The left toe-brachial index is normal.  *See table(s) above for measurements and observations.  Electronically signed by Hortencia Pilar MD on 06/25/2020 at 4:53:00 PM.   Final    VAS [place] LOWER EXTREMITY VENOUS REFLUX  Result Date: 06/25/2020  Lower Venous Reflux Study Indications: Pain, and Discoloration in Feet.  Performing Technologist: Almira Coaster RVS  Examination Guidelines: A complete evaluation includes B-mode imaging, spectral Doppler, color Doppler, and power Doppler as needed of all accessible portions of each vessel. Bilateral testing is considered an integral part of a complete examination. Limited examinations for reoccurring indications may be performed as noted. The reflux portion of the exam is performed with the patient in reverse Trendelenburg. Significant venous reflux is defined as >500 ms in the superficial venous system, and >1 second in the deep venous system.  Venous Reflux Times +--------------+---------+------+-----------+------------+--------+ RIGHT         Reflux NoRefluxReflux TimeDiameter cmsComments                         Yes                                  +--------------+---------+------+-----------+------------+--------+ CFV           no                                              +--------------+---------+------+-----------+------------+--------+ FV prox       no                                             +--------------+---------+------+-----------+------------+--------+ FV mid        no                                             +--------------+---------+------+-----------+------------+--------+ FV dist       no                                             +--------------+---------+------+-----------+------------+--------+ Popliteal     no                                             +--------------+---------+------+-----------+------------+--------+  GSV at [facility]    no                            .54              +--------------+---------+------+-----------+------------+--------+ GSV prox thighno                            .57              +--------------+---------+------+-----------+------------+--------+ GSV mid thigh no                            .28              +--------------+---------+------+-----------+------------+--------+ GSV dist thighno                            .32              +--------------+---------+------+-----------+------------+--------+ GSV at knee   no                            .33              +--------------+---------+------+-----------+------------+--------+ GSV prox calf no                            .36              +--------------+---------+------+-----------+------------+--------+ SSV Pop Fossa no                            .31              +--------------+---------+------+-----------+------------+--------+  +--------------+---------+------+-----------+------------+--------+ LEFT          Reflux NoRefluxReflux TimeDiameter cmsComments                         Yes                                  +--------------+---------+------+-----------+------------+--------+ CFV           no                                              +--------------+---------+------+-----------+------------+--------+ FV prox       no                                             +--------------+---------+------+-----------+------------+--------+ FV mid        no                                             +--------------+---------+------+-----------+------------+--------+ FV dist       no                                             +--------------+---------+------+-----------+------------+--------+  Popliteal     no                                             +--------------+---------+------+-----------+------------+--------+ GSV at SFJ    no                            .47              +--------------+---------+------+-----------+------------+--------+ GSV prox thighno                            .41              +--------------+---------+------+-----------+------------+--------+ GSV mid thigh no                            .34              +--------------+---------+------+-----------+------------+--------+ GSV dist thighno                            .32              +--------------+---------+------+-----------+------------+--------+ GSV at knee   no                            .37              +--------------+---------+------+-----------+------------+--------+ GSV prox calf no                            .22              +--------------+---------+------+-----------+------------+--------+ SSV Pop Fossa no                                             +--------------+---------+------+-----------+------------+--------+   Summary: Bilateral: - No evidence of deep vein thrombosis seen in the lower extremities, bilaterally, from the common femoral through the popliteal veins.  - No evidence of superficial venous thrombosis in the lower extremities, bilaterally.  - No evidence of deep venous insufficiency seen bilaterally in the lower extremity.  - No evidence of superficial venous reflux seen in the  greater saphenous veins bilaterally.  - No evidence of superficial venous reflux seen in the short saphenous veins bilaterally.  *See table(s) above for measurements and observations. Electronically signed by Hortencia Pilar MD on 06/25/2020 at 4:52:57 PM.    Final     Assessment/Plan 1. Lymphedema Recommend:  No surgery or intervention at this point in time.    I have reviewed my previous discussion with the patient regarding swelling and why it causes symptoms.  Patient will continue wearing graduated compression stockings class 1 (20-30 mmHg) on a daily basis. The patient will  beginning wearing the stockings first thing in the morning and removing them in the evening. The patient is instructed specifically not to sleep in the stockings.    In addition, behavioral modification including several periods of elevation of the lower extremities during the day will be continued.  This was reviewed with the patient during the initial  visit.  The patient will also continue routine exercise, especially walking on a daily basis as was discussed during the initial visit.    Despite conservative treatments including graduated compression therapy class 1 and behavioral modification including exercise and elevation the patient  has not obtained adequate control of the lymphedema.  The patient still has stage 3 lymphedema and therefore, I believe that a lymph pump should be added to improve the control of the patient's lymphedema.  Additionally, a lymph pump is warranted because it will reduce the risk of cellulitis and ulceration in the future.  Patient should follow-up in six months    2. History of DVT (deep vein thrombosis) Recommend:   No surgery or intervention at this point in time.  IVC filter is not indicated at present.  The patient is tolerating anticoagulation   Elevation was stressed, use of a recliner was discussed.  I have had a long discussion with the patient regarding DVT and post  phlebitic changes such as swelling and why it  causes symptoms such as pain.  The patient will wear graduated compression stockings class 1 (20-30 mmHg), beginning after three full days of anticoagulation, on a daily basis a prescription was given. The patient will  beginning wearing the stockings first thing in the morning and removing them in the evening. The patient is instructed specifically not to sleep in the stockings.  In addition, behavioral modification including elevation during the day and avoidance of prolonged dependency will be initiated.    The patient will continue anticoagulation for now as there have not been any problems or complications at this point.    3. DDD (degenerative disc disease), lumbosacral Continue NSAID medications as already ordered, these medications have been reviewed and there are no changes at this time.  Continued activity and therapy was stressed.   4. Hyperlipemia, mixed Continue statin as ordered and reviewed, no changes at this time    Hortencia Pilar, MD  06/27/2020 12:25 PM

## 2020-09-11 ENCOUNTER — Ambulatory Visit: Payer: Medicare Other | Admitting: Dermatology

## 2020-09-11 ENCOUNTER — Other Ambulatory Visit: Payer: Self-pay

## 2020-09-11 ENCOUNTER — Encounter: Payer: Self-pay | Admitting: Dermatology

## 2020-09-11 DIAGNOSIS — L821 Other seborrheic keratosis: Secondary | ICD-10-CM | POA: Diagnosis not present

## 2020-09-11 DIAGNOSIS — Z85828 Personal history of other malignant neoplasm of skin: Secondary | ICD-10-CM

## 2020-09-11 DIAGNOSIS — D229 Melanocytic nevi, unspecified: Secondary | ICD-10-CM

## 2020-09-11 DIAGNOSIS — L905 Scar conditions and fibrosis of skin: Secondary | ICD-10-CM | POA: Diagnosis not present

## 2020-09-11 DIAGNOSIS — D1800 Hemangioma unspecified site: Secondary | ICD-10-CM

## 2020-09-11 DIAGNOSIS — L578 Other skin changes due to chronic exposure to nonionizing radiation: Secondary | ICD-10-CM

## 2020-09-11 DIAGNOSIS — L814 Other melanin hyperpigmentation: Secondary | ICD-10-CM

## 2020-09-11 DIAGNOSIS — I781 Nevus, non-neoplastic: Secondary | ICD-10-CM

## 2020-09-11 DIAGNOSIS — L820 Inflamed seborrheic keratosis: Secondary | ICD-10-CM

## 2020-09-11 DIAGNOSIS — Z1283 Encounter for screening for malignant neoplasm of skin: Secondary | ICD-10-CM | POA: Diagnosis not present

## 2020-09-11 NOTE — Patient Instructions (Addendum)
Cryotherapy Aftercare  Wash gently with soap and water everyday.   Apply Vaseline and Band-Aid daily until healed.   Prior to procedure, discussed risks of blister formation, small wound, skin dyspigmentation, or rare scar following cryotherapy. Recommend Vaseline ointment to treated areas while healing.   Seborrheic Keratosis  What causes seborrheic keratoses? Seborrheic keratoses are harmless, common skin growths that first appear during adult life.  As time goes by, more growths appear.  Some people may develop a large number of them.  Seborrheic keratoses appear on both covered and uncovered body parts.  They are not caused by sunlight.  The tendency to develop seborrheic keratoses can be inherited.  They vary in color from skin-colored to gray, brown, or even black.  They can be either smooth or have a rough, warty surface.   Seborrheic keratoses are superficial and look as if they were stuck on the skin.  Under the microscope this type of keratosis looks like layers upon layers of skin.  That is why at times the top layer may seem to fall off, but the rest of the growth remains and re-grows.    Treatment Seborrheic keratoses do not need to be treated, but can easily be removed in the office.  Seborrheic keratoses often cause symptoms when they rub on clothing or jewelry.  Lesions can be in the way of shaving.  If they become inflamed, they can cause itching, soreness, or burning.  Removal of a seborrheic keratosis can be accomplished by freezing, burning, or surgery. If any spot bleeds, scabs, or grows rapidly, please return to have it checked, as these can be an indication of a skin cancer. If you have any questions or concerns for your doctor, please call our main line at [phone] and press option 4 to reach your doctor's medical assistant. If no one answers, please leave a voicemail as directed and we will return your call as soon as possible. Messages left after 4 pm will be answered  the following business day.   You may also send [place] a message via [place]. We typically respond to MyChart messages within 1-2 business days.  For prescription refills, please ask your pharmacy to contact our office. Our fax number is [phone].  If you have an urgent issue when the clinic is closed that cannot wait until the next business day, you can page your doctor at the number below.    Please note that while we do our best to be available for urgent issues outside of office hours, we are not available 24/7.   If you have an urgent issue and are unable to reach [place], you may choose to seek medical care at your doctor's office, retail clinic, urgent care center, or emergency room.  If you have a medical emergency, please immediately call 911 or go to the emergency department.  Pager Numbers  - Dr. Nehemiah Massed: [phone]  - Dr. Laurence Ferrari: [phone]  - Dr. Nicole Kindred: [phone]  In the event of inclement weather, please call our main line at [phone] for an update on the status of any delays or closures.  Dermatology Medication Tips: Please keep the boxes that topical medications come in in order to help keep track of the instructions about where and how to use these. Pharmacies typically print the medication instructions only on the boxes and not directly on the medication tubes.   If your medication is too expensive, please contact our office at [phone] option 4 or send [place] a message through [place].  We are unable to tell what your co-pay for medications will be in advance as this is different depending on your insurance coverage. However, we may be able to find a substitute medication at lower cost or fill out paperwork to get insurance to cover a needed medication.   If a prior authorization is required to get your medication covered by your insurance company, please allow [place] 1-2 business days to complete this process.  Drug prices often vary depending on where the  prescription is filled and some pharmacies may offer cheaper prices.  The website www.goodrx.com contains coupons for medications through different pharmacies. The prices here do not account for what the cost may be with help from insurance (it may be cheaper with your insurance), but the website can give you the price if you did not use any insurance.  - You can print the associated coupon and take it with your prescription to the pharmacy.  - You may also stop by our office during regular business hours and pick up a GoodRx coupon card.  - If you need your prescription sent electronically to a different pharmacy, notify our office through [facility] or by phone at [phone] option 4.

## 2020-09-11 NOTE — Progress Notes (Signed)
Follow-Up Visit   Subjective  Jeanette Harris is a 75 y.o. female who presents for the following: Annual Exam (Patient presents for TBSE. She has a few spots to check on the right hip and L flank at braline. Areas started as a bump and was itchy. Gets irritated by clothing. She has a history of BCC of the right upper buttock.).   The following portions of the chart were reviewed this encounter and updated as appropriate:        Review of Systems:  No other skin or systemic complaints except as noted in HPI or Assessment and Plan.  Objective  Well appearing patient in no apparent distress; mood and affect are within normal limits.  A full examination was performed including scalp, head, eyes, ears, nose, lips, neck, chest, axillae, abdomen, back, buttocks, bilateral upper extremities, bilateral lower extremities, hands, feet, fingers, toes, fingernails, and toenails. All findings within normal limits unless otherwise noted below.  Right Upper Buttock Well healed scar with no evidence of recurrence.   L flank x 1, R upper flank at braline x 1 (2) Erythematous keratotic or waxy stuck-on papule   Right Hip Light violaceous atrophic macule.  Central Forehead 5.33mm waxy tan macule   Assessment & Plan  History of basal cell carcinoma (BCC) Right Upper Buttock  Clear. Observe for recurrence. Call clinic for new or changing lesions.  Recommend regular skin exams, daily broad-spectrum spf 30+ sunscreen use, and photoprotection.    Inflamed seborrheic keratosis L flank x 1, R upper flank at braline x 1  Destruction of lesion - L flank x 1, R upper flank at braline x 1  Destruction method: cryotherapy   Informed consent: discussed and consent obtained   Lesion destroyed using liquid nitrogen: Yes   Region frozen until ice ball extended beyond lesion: Yes   Outcome: patient tolerated procedure well with no complications   Post-procedure details: wound care instructions given    Additional details:  Prior to procedure, discussed risks of blister formation, small wound, skin dyspigmentation, or rare scar following cryotherapy. Recommend Vaseline ointment to treated areas while healing.   Scar Right Hip  From possible abscess.  Observation.  Seborrheic keratosis Central Forehead  Reassured benign age-related growth.  Recommend observation.  Discussed cryotherapy if spot(s) become irritated or inflamed.  Actinic Damage - chronic, secondary to cumulative UV radiation exposure/sun exposure over time - diffuse scaly erythematous macules with underlying dyspigmentation - Recommend daily broad spectrum sunscreen SPF 30+ to sun-exposed areas, reapply every 2 hours as needed.  - Recommend staying in the shade or wearing long sleeves, sun glasses (UVA+UVB protection) and wide brim hats (4-inch brim around the entire circumference of the hat). - Call for new or changing lesions.  Seborrheic Keratoses - Stuck-on, waxy, tan-brown papules and/or plaques  - Benign-appearing - Discussed benign etiology and prognosis. - Observe - Call for any changes  Lentigines - Scattered tan macules - Due to sun exposure - Benign-appering, observe - Recommend daily broad spectrum sunscreen SPF 30+ to sun-exposed areas, reapply every 2 hours as needed. - Call for any changes  Melanocytic Nevi - Tan-brown and/or pink-flesh-colored symmetric macules and papules, including pink flesh papule of the right upper back. - Benign appearing on exam today - Observation - Call clinic for new or changing moles - Recommend daily use of broad spectrum spf 30+ sunscreen to sun-exposed areas.   Hemangiomas - Red papules - Discussed benign nature - Observe - Call for any changes  History of Skin Cancer  Clear. Observe for recurrence or the right wrist. Call clinic for new or changing lesions.   Recommend regular skin exams, daily broad-spectrum spf 30+ sunscreen use, and photoprotection.      Spider Veins - Dilated blue, purple or red veins at the lower extremities/feet - Reassured - These can be treated by sclerotherapy (a procedure to inject a medicine into the veins to make them disappear) if desired, but the treatment is not covered by insurance  Return in about 1 year (around 09/11/2021) for TBSE.  IJamesetta Orleans, CMA, am acting as scribe for Brendolyn Patty, MD .  Documentation: I have reviewed the above documentation for accuracy and completeness, and I agree with the above.  Brendolyn Patty MD

## 2020-12-17 ENCOUNTER — Ambulatory Visit (INDEPENDENT_AMBULATORY_CARE_PROVIDER_SITE_OTHER): Payer: Medicare Other | Admitting: Vascular Surgery

## 2020-12-24 ENCOUNTER — Ambulatory Visit (INDEPENDENT_AMBULATORY_CARE_PROVIDER_SITE_OTHER): Payer: Medicare Other | Admitting: Vascular Surgery

## 2020-12-30 NOTE — Progress Notes (Signed)
MRN : [id number]  KAYCEE HAYCRAFT is a 75 y.o. (07-Aug-1945) female who presents with chief complaint of leg swelling.  History of Present Illness:   The patient returns to the office for followup evaluation regarding significant venous changes to the feet and ankles associated with leg swelling left leg more so than the right.  The swelling and discomfort has persisted but with the lymph pump is better controlled. There have not been any interval development of a ulcerations or wounds.  She is also describing more of a sharp pain in the sole of her foot and heel.  The patient denies problems with the pump, noting it is working well and the leggings are in good condition.  She notes that because of a recent death in the family she has not been using her pump as much but is planning to return to daily use.  The patient has always had difficulty wearing graduated compression stockings but she notes using the lymph pump on a routine basis and  has noted significant improvement in the lymphedema.   Patient stated the lymph pump has been a positive factor in her care.    No outpatient medications have been marked as taking for the 12/31/20 encounter (Appointment) with Delana Meyer, Dolores Lory, MD.    Past Medical History:  Diagnosis Date   Arthritis    Basal cell carcinoma 12/28/2019   R upper buttock   Complication of anesthesia    COPD (chronic obstructive pulmonary disease) ([place])    patient denies   DVT (deep venous thrombosis) ([place]) 04/01/2017   left lower leg   GERD (gastroesophageal reflux disease)    Headache    Heart murmur    Peripheral vascular disease (HCC)    phlebitis   Phlebitis    PONV (postoperative nausea and vomiting)    Skin cancer 03/2019   Right wrist BCC/SCC?    Past Surgical History:  Procedure Laterality Date   ABDOMINAL HYSTERECTOMY     ANKLE SURGERY     APPENDECTOMY     BACK SURGERY     CATARACT EXTRACTION W/PHACO Left 02/13/2016   Procedure: CATARACT  EXTRACTION PHACO AND INTRAOCULAR LENS PLACEMENT ([place]);  Surgeon: Leandrew Koyanagi, MD;  Location: [place];  Service: Ophthalmology;  Laterality: Left;  TORIC   CATARACT EXTRACTION W/PHACO Right 02/27/2016   Procedure: CATARACT EXTRACTION PHACO AND INTRAOCULAR LENS PLACEMENT ([facility]);  Surgeon: Leandrew Koyanagi, MD;  Location: [place];  Service: Ophthalmology;  Laterality: Right;  TORIC Latex sensitivity   CERVICAL FUSION  1992   CHOLECYSTECTOMY     COLONOSCOPY     COLONOSCOPY WITH PROPOFOL N/A 04/19/2018   Procedure: COLONOSCOPY WITH PROPOFOL;  Surgeon: Manya Silvas, MD;  Location: [facility] ENDOSCOPY;  Service: Endoscopy;  Laterality: N/A;   ESOPHAGOGASTRODUODENOSCOPY     ESOPHAGOGASTRODUODENOSCOPY (EGD) WITH PROPOFOL N/A 04/19/2018   Procedure: ESOPHAGOGASTRODUODENOSCOPY (EGD) WITH PROPOFOL;  Surgeon: Manya Silvas, MD;  Location: [facility] ENDOSCOPY;  Service: Endoscopy;  Laterality: N/A;   feet surgery  2000   FOOT SURGERY     KNEE ARTHROSCOPY WITH MENISCAL REPAIR Left 10/01/2017   Procedure: KNEE ARTHROSCOPY PARTIAL AND LATERAL MENISCECTOMY AND CHONDROPLASTY;  Surgeon: Leim Fabry, MD;  Location: [place];  Service: Orthopedics;  Laterality: Left;   right ankle surgery  1977   SHOULDER ARTHROSCOPY      Social History Social History   Tobacco Use   Smoking status: Former    Packs/day: 0.50    Years: 3.00  Pack years: 1.50    Types: Cigarettes    Quit date: 51    Years since quitting: 42.8   Smokeless tobacco: Never   Tobacco comments:    only smoked for about 3 years   Vaping Use   Vaping Use: Never used  Substance Use Topics   Alcohol use: No   Drug use: No    Family History Family History  Problem Relation Age of Onset   Breast cancer Maternal Aunt    Diabetes Mother    Heart disease Mother    Prostate cancer Father    Hypertension Father    Breast cancer Cousin    Colon cancer Cousin    Breast cancer Cousin    Lung  cancer Maternal Aunt     Allergies  Allergen Reactions   Aspirin Other (See Comments)    unknown   Ivp Dye [Iodinated Diagnostic Agents] Nausea And Vomiting   Nsaids Other (See Comments)    Unknown   Latex Rash     REVIEW OF SYSTEMS (Negative unless checked)  Constitutional: [] Weight loss  [] Fever  [] Chills Cardiac: [] Chest pain   [] Chest pressure   [] Palpitations   [] Shortness of breath when laying flat   [] Shortness of breath with exertion. Vascular:  [] Pain in legs with walking   [] Pain in legs at rest  [] History of DVT   [] Phlebitis   [x] Swelling in legs   [] Varicose veins   [] Non-healing ulcers Pulmonary:   [] Uses home oxygen   [] Productive cough   [] Hemoptysis   [] Wheeze  [] COPD   [] Asthma Neurologic:  [] Dizziness   [] Seizures   [] History of stroke   [] History of TIA  [] Aphasia   [] Vissual changes   [] Weakness or numbness in arm   [] Weakness or numbness in leg Musculoskeletal:   [] Joint swelling   [] Joint pain   [] Low back pain Hematologic:  [] Easy bruising  [] Easy bleeding   [] Hypercoagulable state   [] Anemic Gastrointestinal:  [] Diarrhea   [] Vomiting  [x] Gastroesophageal reflux/heartburn   [] Difficulty swallowing. Genitourinary:  [] Chronic kidney disease   [] Difficult urination  [] Frequent urination   [] Blood in urine Skin:  [] Rashes   [] Ulcers  Psychological:  [] History of anxiety   []  History of major depression.  Physical Examination  There were no vitals filed for this visit. There is no height or weight on file to calculate BMI. Gen: WD/WN, NAD Head: [place]/AT, No temporalis wasting.  Ear/Nose/Throat: Hearing grossly intact, nares w/o erythema or drainage, pinna without lesions Eyes: PER, EOMI, sclera nonicteric.  Neck: Supple, no gross masses.  No JVD.  Pulmonary:  Good air movement, no audible wheezing, no use of accessory muscles.  Cardiac: RRR, precordium not hyperdynamic. Vascular:  scattered varicosities present bilaterally.  Severe venous stasis changes to the  feet and ankles bilaterally.  2+ soft pitting edema  Vessel Right Left  Radial Palpable Palpable  DP 2+ palpable 2+ palpable  PT 2+ palpable 2+ palpable  Gastrointestinal: soft, non-distended. No guarding/no peritoneal signs.  Musculoskeletal: M/S 5/5 throughout.  No deformity.  Neurologic: CN 2-12 intact. Pain and light touch intact in extremities.  Symmetrical.  Speech is fluent. Motor exam as listed above. Psychiatric: Judgment intact, Mood & affect appropriate for pt's clinical situation. Dermatologic: Venous rashes no ulcers noted.  No changes consistent with cellulitis. Lymph : No lichenification or skin changes of chronic lymphedema.  CBC Lab Results  Component Value Date   WBC 8.1 03/16/2020   HGB 13.5 03/16/2020   HCT 39.6 03/16/2020  MCV 100.3 (H) 03/16/2020   PLT 268 03/16/2020    BMET    Component Value Date/Time   NA 136 03/16/2020 1204   NA 141 04/07/2012 0214   K 3.6 03/16/2020 1204   K 4.3 04/07/2012 0214   CL 104 03/16/2020 1204   CL 106 04/07/2012 0214   CO2 24 03/16/2020 1204   CO2 25 04/07/2012 0214   GLUCOSE 89 03/16/2020 1204   GLUCOSE 137 (H) 04/07/2012 0214   BUN 29 (H) 03/16/2020 1204   BUN 16 04/07/2012 0214   CREATININE 0.83 03/16/2020 1204   CREATININE 0.95 04/07/2012 0214   CALCIUM 9.0 03/16/2020 1204   CALCIUM 9.2 04/07/2012 0214   GFRNONAA >60 03/16/2020 1204   GFRNONAA >60 04/07/2012 0214   GFRAA >60 02/12/2015 1341   GFRAA >60 04/07/2012 0214   CrCl cannot be calculated (Patient's most recent lab result is older than the maximum 21 days allowed.).  COAG No results found for: INR, PROTIME  Radiology No results found.   Assessment/Plan 1. Chronic venous insufficiency  No surgery or intervention at this point in time.    I have reviewed my discussion with the patient regarding venous insufficiency and lymphedema and why it  causes symptoms.     In addition, behavioral modification throughout the day will be continued.  This  will include frequent elevation (such as in a recliner), use of over the counter pain medications as needed and exercise such as walking.  I have reviewed systemic causes for chronic edema such as liver, kidney and cardiac etiologies and there does not appear to be any significant changes in these organ systems over the past year.  The patient is under the impression that these organ systems are all stable and unchanged.    The patient will continue aggressive use of the  lymph pump.  This will continue to improve the edema control and prevent sequela such as ulcers and infections.   The patient will follow-up with me on an annual basis.    2. Lymphedema No surgery or intervention at this point in time.    I have reviewed my discussion with the patient regarding venous insufficiency and lymphedema and why it  causes symptoms.     In addition, behavioral modification throughout the day will be continued.  This will include frequent elevation (such as in a recliner), use of over the counter pain medications as needed and exercise such as walking.  I have reviewed systemic causes for chronic edema such as liver, kidney and cardiac etiologies and there does not appear to be any significant changes in these organ systems over the past year.  The patient is under the impression that these organ systems are all stable and unchanged.    The patient will continue aggressive use of the  lymph pump.  This will continue to improve the edema control and prevent sequela such as ulcers and infections.   The patient will follow-up with me on an annual basis.    3. Pain of foot, unspecified laterality With respect to her foot pain and heel pain this sounds more like plantar fasciitis or degenerative changes within the forefoot and heel.  We discussed seeing a podiatrist for further evaluation  4. Hyperlipemia, mixed Continue statin as ordered and reviewed, no changes at this time     Hortencia Pilar,  MD  12/30/2020 11:10 AM

## 2020-12-31 ENCOUNTER — Encounter (INDEPENDENT_AMBULATORY_CARE_PROVIDER_SITE_OTHER): Payer: Self-pay | Admitting: Vascular Surgery

## 2020-12-31 ENCOUNTER — Other Ambulatory Visit: Payer: Self-pay

## 2020-12-31 ENCOUNTER — Ambulatory Visit (INDEPENDENT_AMBULATORY_CARE_PROVIDER_SITE_OTHER): Payer: Medicare Other | Admitting: Vascular Surgery

## 2020-12-31 VITALS — BP 130/75 | HR 86 | Resp 16 | Wt 173.4 lb

## 2020-12-31 DIAGNOSIS — I872 Venous insufficiency (chronic) (peripheral): Secondary | ICD-10-CM

## 2020-12-31 DIAGNOSIS — E782 Mixed hyperlipidemia: Secondary | ICD-10-CM

## 2020-12-31 DIAGNOSIS — M79673 Pain in unspecified foot: Secondary | ICD-10-CM

## 2020-12-31 DIAGNOSIS — I890 Lymphedema, not elsewhere classified: Secondary | ICD-10-CM | POA: Diagnosis not present

## 2021-03-12 ENCOUNTER — Other Ambulatory Visit: Payer: Self-pay | Admitting: Physician Assistant

## 2021-03-12 DIAGNOSIS — Z1231 Encounter for screening mammogram for malignant neoplasm of breast: Secondary | ICD-10-CM

## 2021-03-27 ENCOUNTER — Encounter: Payer: Self-pay | Admitting: Emergency Medicine

## 2021-03-27 ENCOUNTER — Observation Stay
Admission: EM | Admit: 2021-03-27 | Discharge: 2021-03-28 | Disposition: A | Discharge status: Home / Self Care | Payer: Medicare Other | Attending: Internal Medicine | Admitting: Internal Medicine

## 2021-03-27 ENCOUNTER — Other Ambulatory Visit: Payer: Self-pay

## 2021-03-27 ENCOUNTER — Emergency Department: Payer: Medicare Other

## 2021-03-27 DIAGNOSIS — Z85828 Personal history of other malignant neoplasm of skin: Secondary | ICD-10-CM | POA: Insufficient documentation

## 2021-03-27 DIAGNOSIS — K219 Gastro-esophageal reflux disease without esophagitis: Secondary | ICD-10-CM | POA: Diagnosis not present

## 2021-03-27 DIAGNOSIS — G2581 Restless legs syndrome: Secondary | ICD-10-CM | POA: Diagnosis not present

## 2021-03-27 DIAGNOSIS — I70229 Atherosclerosis of native arteries of extremities with rest pain, unspecified extremity: Secondary | ICD-10-CM | POA: Insufficient documentation

## 2021-03-27 DIAGNOSIS — Z7901 Long term (current) use of anticoagulants: Secondary | ICD-10-CM | POA: Insufficient documentation

## 2021-03-27 DIAGNOSIS — M7660 Achilles tendinitis, unspecified leg: Secondary | ICD-10-CM | POA: Diagnosis not present

## 2021-03-27 DIAGNOSIS — Z87891 Personal history of nicotine dependence: Secondary | ICD-10-CM | POA: Diagnosis not present

## 2021-03-27 DIAGNOSIS — Z79899 Other long term (current) drug therapy: Secondary | ICD-10-CM | POA: Insufficient documentation

## 2021-03-27 DIAGNOSIS — Z91040 Latex allergy status: Secondary | ICD-10-CM | POA: Insufficient documentation

## 2021-03-27 DIAGNOSIS — Z20822 Contact with and (suspected) exposure to covid-19: Secondary | ICD-10-CM | POA: Insufficient documentation

## 2021-03-27 DIAGNOSIS — I739 Peripheral vascular disease, unspecified: Secondary | ICD-10-CM | POA: Diagnosis present

## 2021-03-27 DIAGNOSIS — J449 Chronic obstructive pulmonary disease, unspecified: Secondary | ICD-10-CM | POA: Insufficient documentation

## 2021-03-27 DIAGNOSIS — M79604 Pain in right leg: Secondary | ICD-10-CM | POA: Diagnosis present

## 2021-03-27 DIAGNOSIS — F419 Anxiety disorder, unspecified: Secondary | ICD-10-CM

## 2021-03-27 LAB — CBC WITH DIFFERENTIAL/PLATELET
Abs Immature Granulocytes: 0.01 10*3/uL (ref 0.00–0.07)
Basophils Absolute: 0.1 10*3/uL (ref 0.0–0.1)
Basophils Relative: 1 %
Eosinophils Absolute: 0.2 10*3/uL (ref 0.0–0.5)
Eosinophils Relative: 3 %
HCT: 39.6 % (ref 36.0–46.0)
Hemoglobin: 13.1 g/dL (ref 12.0–15.0)
Immature Granulocytes: 0 %
Lymphocytes Relative: 30 %
Lymphs Abs: 1.5 10*3/uL (ref 0.7–4.0)
MCH: 33.6 pg (ref 26.0–34.0)
MCHC: 33.1 g/dL (ref 30.0–36.0)
MCV: 101.5 fL — ABNORMAL HIGH (ref 80.0–100.0)
Monocytes Absolute: 0.4 10*3/uL (ref 0.1–1.0)
Monocytes Relative: 8 %
Neutro Abs: 3 10*3/uL (ref 1.7–7.7)
Neutrophils Relative %: 58 %
Platelets: 235 10*3/uL (ref 150–400)
RBC: 3.9 MIL/uL (ref 3.87–5.11)
RDW: 12.5 % (ref 11.5–15.5)
WBC: 5.1 10*3/uL (ref 4.0–10.5)
nRBC: 0 % (ref 0.0–0.2)

## 2021-03-27 LAB — BASIC METABOLIC PANEL
Anion gap: 6 (ref 5–15)
BUN: 15 mg/dL (ref 8–23)
CO2: 24 mmol/L (ref 22–32)
Calcium: 9.4 mg/dL (ref 8.9–10.3)
Chloride: 105 mmol/L (ref 98–111)
Creatinine, Ser: 0.72 mg/dL (ref 0.44–1.00)
GFR, Estimated: >60 mL/min (ref >60)
Glucose, Bld: 96 mg/dL (ref 70–99)
Potassium: 3.8 mmol/L (ref 3.5–5.1)
Sodium: 135 mmol/L (ref 135–145)

## 2021-03-27 LAB — HEMOGLOBIN A1C
Hgb A1c MFr Bld: 5.7 % — ABNORMAL HIGH (ref 4.8–5.6)
Mean Plasma Glucose: 116.89 mg/dL

## 2021-03-27 LAB — RESP PANEL BY RT-PCR (FLU A&B, COVID) ARPGX2
Influenza A by PCR: NEGATIVE
Influenza B by PCR: NEGATIVE
SARS Coronavirus 2 by RT PCR: NEGATIVE

## 2021-03-27 LAB — CK: Total CK: 75 U/L (ref 38–234)

## 2021-03-27 LAB — PROTIME-INR
INR: 1 (ref 0.8–1.2)
Prothrombin Time: 13.3 seconds (ref 11.4–15.2)

## 2021-03-27 LAB — APTT: aPTT: 28 seconds (ref 24–36)

## 2021-03-27 LAB — HEPARIN LEVEL (UNFRACTIONATED): Heparin Unfractionated: >1.1 IU/mL — ABNORMAL HIGH (ref 0.30–0.70)

## 2021-03-27 MED ORDER — HEPARIN BOLUS VIA INFUSION
5000.0000 [IU] | Freq: Once | INTRAVENOUS | Status: AC
  Administered 2021-03-27: 5000 [IU] via INTRAVENOUS
  Filled 2021-03-27: qty 5000

## 2021-03-27 MED ORDER — HEPARIN (PORCINE) 25000 UT/250ML-% IV SOLN
1250.0000 [IU]/h | INTRAVENOUS | Status: DC
  Administered 2021-03-27: 1250 [IU]/h via INTRAVENOUS
  Filled 2021-03-27: qty 250

## 2021-03-27 MED ORDER — ACETAMINOPHEN 500 MG PO TABS
1000.0000 mg | ORAL_TABLET | Freq: Once | ORAL | Status: AC
  Administered 2021-03-27: 1000 mg via ORAL
  Filled 2021-03-27: qty 2

## 2021-03-27 MED ORDER — NORTRIPTYLINE HCL 25 MG PO CAPS
75.0000 mg | ORAL_CAPSULE | Freq: Every day | ORAL | Status: DC
  Administered 2021-03-27: 75 mg via ORAL
  Filled 2021-03-27 (×2): qty 3

## 2021-03-27 MED ORDER — ACETAMINOPHEN 325 MG PO TABS
650.0000 mg | ORAL_TABLET | Freq: Four times a day (QID) | ORAL | Status: DC | PRN
  Administered 2021-03-28: 650 mg via ORAL
  Filled 2021-03-27: qty 2

## 2021-03-27 MED ORDER — TOPIRAMATE 25 MG PO TABS
50.0000 mg | ORAL_TABLET | Freq: Two times a day (BID) | ORAL | Status: DC
Start: 2021-03-27 — End: 2021-03-29
  Administered 2021-03-27 – 2021-03-28 (×2): 50 mg via ORAL
  Filled 2021-03-27 (×3): qty 2

## 2021-03-27 MED ORDER — LIDOCAINE 5 % EX PTCH
1.0000 | MEDICATED_PATCH | CUTANEOUS | Status: DC
  Administered 2021-03-27 – 2021-03-28 (×2): 1 via TRANSDERMAL
  Filled 2021-03-27 (×2): qty 1

## 2021-03-27 MED ORDER — BISACODYL 5 MG PO TBEC: 5.0000 mg | DELAYED_RELEASE_TABLET | Freq: Every day | ORAL | Status: DC | PRN

## 2021-03-27 MED ORDER — ACETAMINOPHEN 650 MG RE SUPP: 650.0000 mg | Freq: Four times a day (QID) | RECTAL | Status: DC | PRN

## 2021-03-27 MED ORDER — HYDROCODONE-ACETAMINOPHEN 5-325 MG PO TABS: 1.0000 | ORAL_TABLET | Freq: Four times a day (QID) | ORAL | Status: DC | PRN

## 2021-03-27 MED ORDER — HEPARIN (PORCINE) 25000 UT/250ML-% IV SOLN
INTRAVENOUS | Status: AC
  Filled 2021-03-27: qty 250

## 2021-03-27 MED ORDER — POLYETHYLENE GLYCOL 3350 17 G PO PACK
17.0000 g | PACK | Freq: Every day | ORAL | Status: DC
  Administered 2021-03-27 – 2021-03-28 (×2): 17 g via ORAL
  Filled 2021-03-27 (×2): qty 1

## 2021-03-27 NOTE — ED Triage Notes (Signed)
Pt comes into the ED via [facility] c/o right leg pain in the calf that has been ongoing for 2 weeks.  Pt states it finally has gotten to the point where she cant put pressure on the leg.  Pt brought over to r/o DVT.  Pt denies any SHOB or dizziness.

## 2021-03-27 NOTE — ED Provider Notes (Addendum)
[facility] Provider Note    Event Date/Time   First MD Initiated Contact with Patient 03/27/21 1313     (approximate)   History   Leg Pain   HPI  Jeanette Harris is a 76 y.o. female with a past medical history of DVT not currently on anticoagulation, GERD, PVD and some chronic lymphedema in the lower extremities and venous insufficiency who presents for assessment approximately 2 weeks of some pain and soreness in the right posterior calf and the lower aspect around the Achilles tendon.  Patient denies any injuries or falls.  She has had some chronic purplish discoloration of the dorsum of both feet which is not different today than usual.  She does not recall any injuries.  He states he is most worried about a blood clot.  No fevers, chills, headache, earache, sore throat, vomiting, diarrhea, rash or any other acute sick symptoms.      Physical Exam  Triage Vital Signs: ED Triage Vitals  Enc Vitals Group     BP 03/27/21 1200 126/68     Pulse Rate 03/27/21 1200 75     Resp 03/27/21 1200 16     Temp 03/27/21 1200 98.2 F (36.8 C)     Temp Source 03/27/21 1200 Oral     SpO2 03/27/21 1200 100 %     Weight 03/27/21 1154 173 lb 8 oz (78.7 kg)     Height 03/27/21 1154 5\' 6"  (1.676 m)     Head Circumference --      Peak Flow --      Pain Score 03/27/21 1154 6     Pain Loc --      Pain Edu? --      Excl. in [place]? --     Most recent vital signs: Vitals:   03/27/21 1416 03/27/21 1757  BP: 124/66 (!) 122/59  Pulse: 78 79  Resp: 17 16  Temp: 98.2 F (36.8 C) 97.9 F (36.6 C)  SpO2: 100% 100%    General: Awake, no distress.  CV:  No murmurs rubs or gallops.  I am unable to Doppler or palpate DP or PT pulses in the right lower extremity.  I am able to Doppler a popliteal artery pulse. Resp:  Normal effort.  Abd:  No distention.  Other:  There is some purplish discoloration of the dorsum of both feet which patient states is chronic with some erythema  that is blanchable surrounding this.  No induration, clear focal areas of tenderness, fluctuance or edema about the ankle or knee.  Patient is able to flex and extend the knee against resistance.  Sensation is intact light touch throughout.   ED Results / Procedures / Treatments  Labs (all labs ordered are listed, but only abnormal results are displayed) Labs Reviewed  CBC WITH DIFFERENTIAL/PLATELET - Abnormal; Notable for the following components:      Result Value   MCV 101.5 (*)    All other components within normal limits  RESP PANEL BY RT-PCR (FLU A&B, COVID) ARPGX2  BASIC METABOLIC PANEL  CK  APTT  PROTIME-INR  HEMOGLOBIN A1C  HEPARIN LEVEL (UNFRACTIONATED)  BASIC METABOLIC PANEL  CBC     EKG  EKG remarkable for sinus rhythm with a ventricular rate of 70, normal axis, unremarkable intervals without evidence of acute ischemia or significant arrhythmia.   RADIOLOGY  Right lower extremity ultrasound reviewed by myself shows no evidence of DVT, abscess, cyst or other acute process.  Also reviewed  radiology interpretation and agree with their findings.   PROCEDURES:  Critical Care performed: No  Procedures    MEDICATIONS ORDERED IN ED: Medications  lidocaine (LIDODERM) 5 % 1 patch (1 patch Transdermal Patch Applied 03/27/21 1359)  nortriptyline (PAMELOR) capsule 75 mg (has no administration in time range)  topiramate (TOPAMAX) tablet 50 mg (has no administration in time range)  acetaminophen (TYLENOL) tablet 650 mg (has no administration in time range)    Or  acetaminophen (TYLENOL) suppository 650 mg (has no administration in time range)  polyethylene glycol (MIRALAX / GLYCOLAX) packet 17 g (17 g Oral Given 03/27/21 1828)  bisacodyl (DULCOLAX) EC tablet 5 mg (has no administration in time range)  HYDROcodone-acetaminophen (NORCO/VICODIN) 5-325 MG per tablet 1 tablet (has no administration in time range)  heparin ADULT infusion 100 units/mL (25000 units/263mL)  (1,250 Units/hr Intravenous New Bag/Given 03/27/21 1826)  acetaminophen (TYLENOL) tablet 1,000 mg (1,000 mg Oral Given 03/27/21 1358)  heparin bolus via infusion 5,000 Units (5,000 Units Intravenous Bolus from Bag 03/27/21 1827)     IMPRESSION / MDM / ASSESSMENT AND PLAN / ED COURSE  I reviewed the triage vital signs and the nursing notes.                              Differential diagnosis includes, but is not limited to DVT, cellulitis, compartment syndrome, worsening claudication from progression of vascular disease.  No history or exam features to suggest acute trauma or penetrating injury or any necrotizing infection at this time.  Right lower extremity ultrasound reviewed by myself shows no evidence of DVT, abscess, cyst or other acute process.  Also reviewed radiology interpretation and agree with their findings.  BMP without any significant electrolyte or metabolic derangements.  CK not consistent with myositis.  CBC without leukocytosis or acute anemia.  On several attempts I am unable to Doppler a DP pulse on the right or patient is symptomatic which seemingly was easily palpable in clinic as recently as 10/24.  And concern for worsening claudication given description of duration of symptoms with 4 days of worsening pain in the setting of 2 or 3 weeks of soreness with exertion in that calf.  Lower suspicion for an acute arterial occlusion at this time.  Patient notes she can hardly walk at all due to pain now.  I discussed my concern for worsening claudication with on-call vascular surgeon Dr. Delana Meyer who recommended initiation of heparin and hospitalist admission as well as obtaining ABIs as unfortunately due to patient's contrast allergy she cannot undergo CTA runoff study.  I discussed this with the patient and she is amenable with plan. Order for ABI placed although as noted above lower suspicion at this time for acute blockage and more concern for chronic progression of PVD now causing  pain so patient can hardly take more than a couple steps vs other cause possible related to tendonitis or other MSK not impacting patient's ability ambulate.   Care of pt signed over to oncoming provider at approximately 3:30 PM.  Plan is to admit to hospitalist service.      FINAL CLINICAL IMPRESSION(S) / ED DIAGNOSES   Final diagnoses:  Right leg pain  Rest pain of lower extremity due to atherosclerosis ([place])     Rx / DC Orders   ED Discharge Orders     None        Note:  This document was prepared using Dragon voice recognition  software and may include unintentional dictation errors.   Lucrezia Starch, MD 03/27/21 1539    Lucrezia Starch, MD 03/27/21 1657    Lucrezia Starch, MD 03/27/21 [phone]

## 2021-03-27 NOTE — Discharge Instructions (Addendum)
Can try Salonpas 4% as needed for leg pain (over the counter)

## 2021-03-27 NOTE — Consult Note (Addendum)
@LOGO @   MRN : [id number]  Jeanette Harris is a 76 y.o. ([birth year]) female who presents with chief complaint of right calf and ankle pain.  History of Present Illness:   I am asked to evaluate the patient by Dr. Hulan Saas.  Patient is a 76 year old who I have seen in the office most recently in October 2022.  Location: Right ankle posteriorly radiating distal to proximally into the calf Character/quality of the symptom: She describes it as a squeezing or tightness of the leg Severity: Very severe to the point that the patient came to the ER because she could not take it anymore Duration: The pain is continuous Timing/onset: It began 4 days ago Aggravating/context: Pressure or touching the area aggravates the pain for example laying in bed the pressure of her leg on the mattress hurts so that she is now more laying on her side, also she mention walking makes it worse Relieving/modifying: Alleviating any pressure.  Of note, the patient has had 2 lumbar spine surgeries.  She also had left knee surgery by Dr. 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  I have personally reviewed the duplex ultrasound and there is no evidence for DVT or other venous abnormalities of the right lower extremity.  I have personally reviewed the ABIs and they are normal bilaterally  Current Meds  Medication Sig   acetaminophen (TYLENOL) 325 MG tablet Take 1,000 mg by mouth in the morning and at bedtime.   calcium-vitamin D (OSCAL WITH D) 500-200 MG-UNIT tablet Take 1 tablet by mouth.   docusate sodium (COLACE) 50 MG capsule Take 50 mg by mouth 2 (two) times daily.   lansoprazole (PREVACID) 30 MG capsule Take 30 mg by mouth daily at 12 noon.   nortriptyline (PAMELOR) 75 MG capsule Take 75 mg by mouth at bedtime.   Potassium 95 MG TABS Take 1 tablet by mouth daily.   sucralfate (CARAFATE) 1 g tablet Take 1 g by mouth 4 (four) times daily -  with meals and  at bedtime.   topiramate (TOPAMAX) 100 MG tablet Take 100 mg by mouth 2 (two) times daily.   vitamin E 180 MG (400 UNITS) capsule Take by mouth.    Past Medical History:  Diagnosis Date   Arthritis    Basal cell carcinoma 12/28/2019   R upper buttock   Complication of anesthesia    COPD (chronic obstructive pulmonary disease) ([place])    patient denies   DVT (deep venous thrombosis) ([place]) 04/01/2017   left lower leg   GERD (gastroesophageal reflux disease)    Headache    Heart murmur    Peripheral vascular disease (HCC)    phlebitis   Phlebitis    PONV (postoperative nausea and vomiting)    Skin cancer 03/2019   Right wrist BCC/SCC?    Past Surgical History:  Procedure Laterality Date   ABDOMINAL HYSTERECTOMY     ANKLE SURGERY     APPENDECTOMY     BACK SURGERY     CATARACT EXTRACTION W/PHACO Left 02/13/2016   Procedure: CATARACT EXTRACTION PHACO AND INTRAOCULAR LENS PLACEMENT ([place]);  Surgeon: Leandrew Koyanagi, MD;  Location: [place];  Service: Ophthalmology;  Laterality: Left;  TORIC   CATARACT EXTRACTION W/PHACO Right 02/27/2016   Procedure: CATARACT EXTRACTION PHACO AND INTRAOCULAR LENS PLACEMENT ([facility]);  Surgeon: Leandrew Koyanagi, MD;  Location: [place];  Service: Ophthalmology;  Laterality: Right;  TORIC Latex sensitivity   CERVICAL  FUSION  1992   CHOLECYSTECTOMY     COLONOSCOPY     COLONOSCOPY WITH PROPOFOL N/A 04/19/2018   Procedure: COLONOSCOPY WITH PROPOFOL;  Surgeon: Manya Silvas, MD;  Location: [facility] ENDOSCOPY;  Service: Endoscopy;  Laterality: N/A;   ESOPHAGOGASTRODUODENOSCOPY     ESOPHAGOGASTRODUODENOSCOPY (EGD) WITH PROPOFOL N/A 04/19/2018   Procedure: ESOPHAGOGASTRODUODENOSCOPY (EGD) WITH PROPOFOL;  Surgeon: Manya Silvas, MD;  Location: [facility] ENDOSCOPY;  Service: Endoscopy;  Laterality: N/A;   feet surgery  2000   FOOT SURGERY     KNEE ARTHROSCOPY WITH MENISCAL REPAIR Left 10/01/2017   Procedure: KNEE ARTHROSCOPY PARTIAL AND  LATERAL MENISCECTOMY AND CHONDROPLASTY;  Surgeon: Leim Fabry, MD;  Location: [place];  Service: Orthopedics;  Laterality: Left;   right ankle surgery  1977   SHOULDER ARTHROSCOPY      Social History Social History   Tobacco Use   Smoking status: Former    Packs/day: 0.50    Years: 3.00    Pack years: 1.50    Types: Cigarettes    Quit date: 1980    Years since quitting: 43.0   Smokeless tobacco: Never   Tobacco comments:    only smoked for about 3 years   Vaping Use   Vaping Use: Never used  Substance Use Topics   Alcohol use: No   Drug use: No    Family History Family History  Problem Relation Age of Onset   Breast cancer Maternal Aunt    Diabetes Mother    Heart disease Mother    Prostate cancer Father    Hypertension Father    Breast cancer Cousin    Colon cancer Cousin    Breast cancer Cousin    Lung cancer Maternal Aunt     Allergies  Allergen Reactions   Aspirin Other (See Comments)    Bleeding ulcers in the stomach   Ivp Dye [Iodinated Contrast Media] Nausea And Vomiting   Nsaids Other (See Comments)    Unknown   Latex Rash     REVIEW OF SYSTEMS (Negative unless checked)  Constitutional: [] Weight loss  [] Fever  [] Chills Cardiac: [] Chest pain   [] Chest pressure   [] Palpitations   [] Shortness of breath when laying flat   [] Shortness of breath with exertion. Vascular:  [x] Pain in legs with walking   [x] Pain in legs at rest  [] History of DVT   [] Phlebitis   [x] Swelling in legs   [x] Varicose veins   [] Non-healing ulcers Pulmonary:   [] Uses home oxygen   [] Productive cough   [] Hemoptysis   [] Wheeze  [] COPD   [] Asthma Neurologic:  [] Dizziness   [] Seizures   [] History of stroke   [] History of TIA  [] Aphasia   [] Vissual changes   [] Weakness or numbness in arm   [] Weakness or numbness in leg Musculoskeletal:   [] Joint swelling   [x] Joint pain   [x] Low back pain Hematologic:  [] Easy bruising  [] Easy bleeding   [] Hypercoagulable state    [] Anemic Gastrointestinal:  [] Diarrhea   [] Vomiting  [] Gastroesophageal reflux/heartburn   [] Difficulty swallowing. Genitourinary:  [] Chronic kidney disease   [] Difficult urination  [] Frequent urination   [] Blood in urine Skin:  [] Rashes   [] Ulcers  Psychological:  [] History of anxiety   []  History of major depression.  Physical Examination  Vitals:   03/27/21 1154 03/27/21 1200 03/27/21 1416 03/27/21 1757  BP:  126/68 124/66 (!) 122/59  Pulse:  75 78 79  Resp:  16 17 16   Temp:  98.2 F (36.8 C) 98.2 F (36.8  C) 97.9 F (36.6 C)  TempSrc:  Oral Oral   SpO2:  100% 100% 100%  Weight: 78.7 kg     Height: 5\' 6"  (1.676 m)      Body mass index is 28 kg/m. Gen: WD/WN, NAD Head: [place]/AT, No temporalis wasting.  Ear/Nose/Throat: Hearing grossly intact, nares w/o erythema or drainage, pinna without lesions Eyes: PER, EOMI, sclera nonicteric.  Neck: Supple, no gross masses.  No JVD.  Pulmonary:  Good air movement, no audible wheezing, no use of accessory muscles.  Cardiac: RRR, precordium not hyperdynamic. Vascular:  scattered varicosities present bilaterally.  Moderate to severe venous stasis changes to the feet and distal ankles bilaterally.  1+ soft pitting edema  Vessel Right Left  Radial Palpable Palpable  DP Trace -  PT Trace  -  Gastrointestinal: soft, non-distended. No guarding/no peritoneal signs.  Musculoskeletal: M/S 5/5 throughout.  No deformity.  Neurologic: CN 2-12 intact. Pain and light touch intact in extremities.  Symmetrical.  Speech is fluent. Motor exam as listed above. Psychiatric: Judgment intact, Mood & affect appropriate for pt's clinical situation. Dermatologic: Venous rashes no ulcers noted.  No changes consistent with cellulitis. Lymph : No lichenification or skin changes of chronic lymphedema.  CBC Lab Results  Component Value Date   WBC 5.1 03/27/2021   HGB 13.1 03/27/2021   HCT 39.6 03/27/2021   MCV 101.5 (H) 03/27/2021   PLT 235 03/27/2021     BMET    Component Value Date/Time   NA 135 03/27/2021 1400   NA 141 04/07/2012 0214   K 3.8 03/27/2021 1400   K 4.3 04/07/2012 0214   CL 105 03/27/2021 1400   CL 106 04/07/2012 0214   CO2 24 03/27/2021 1400   CO2 25 04/07/2012 0214   GLUCOSE 96 03/27/2021 1400   GLUCOSE 137 (H) 04/07/2012 0214   BUN 15 03/27/2021 1400   BUN 16 04/07/2012 0214   CREATININE 0.72 03/27/2021 1400   CREATININE 0.95 04/07/2012 0214   CALCIUM 9.4 03/27/2021 1400   CALCIUM 9.2 04/07/2012 0214   GFRNONAA >60 03/27/2021 1400   GFRNONAA >60 04/07/2012 0214   GFRAA >60 02/12/2015 1341   GFRAA >60 04/07/2012 0214   Estimated Creatinine Clearance: 66.3 mL/min (by C-G formula based on SCr of 0.72 mg/dL).  COAG Lab Results  Component Value Date   INR 1.0 03/27/2021    Radiology US Venous Img Lower Unilateral Right  Result Date: 03/27/2021 CLINICAL DATA:  Right calf pain. EXAM: RIGHT LOWER EXTREMITY VENOUS DOPPLER ULTRASOUND TECHNIQUE: Gray-scale sonography with compression, as well as color and duplex ultrasound, were performed to evaluate the deep venous system(s) from the level of the common femoral vein through the popliteal and proximal calf veins. COMPARISON:  None. FINDINGS: VENOUS Normal compressibility of the common femoral, superficial femoral, and popliteal veins, as well as the visualized calf veins. Visualized portions of profunda femoral vein and great saphenous vein unremarkable. No filling defects to suggest DVT on grayscale or color Doppler imaging. Doppler waveforms show normal direction of venous flow, normal respiratory plasticity and response to augmentation. Limited views of the contralateral common femoral vein are unremarkable. OTHER None. Limitations: none IMPRESSION: Negative. Electronically Signed   By: Misty Stanley M.D.   On: 03/27/2021 13:12   US ARTERIAL ABI (SCREENING LOWER EXTREMITY)  Result Date: 03/27/2021 CLINICAL DATA:  76 year old female with right leg pain EXAM:  NONINVASIVE PHYSIOLOGIC VASCULAR STUDY OF BILATERAL LOWER EXTREMITIES TECHNIQUE: Evaluation of both lower extremities were performed at rest, including  calculation of ankle-brachial indices with single level Doppler, pressure and pulse volume recording. COMPARISON:  None. FINDINGS: Right ABI:  1.1 Left ABI:  1.1 Right Lower Extremity:  Normal arterial waveforms at the ankle. Left Lower Extremity:  Normal arterial waveforms at the ankle. 1.0-1.4 Normal IMPRESSION: Normal exam. Electronically Signed   By: Jacqulynn Cadet M.D.   On: 03/27/2021 16:52     Assessment/Plan 1.  Pain right lower extremity and ankle: The patient has a negative duplex ultrasound of the venous system, no clot is identified no deep venous abnormalities noted.  Her venous stasis changes of the feet and ankles are unchanged since I saw her in the office last.  Her ABIs are normal bilaterally and correlate quite well with the ABIs from my office in October 2022.  There is no evidence to suggest that her pain is secondary to a vascular issue.  It would appear much more likely that this is orthopedic in nature whether it be related to degenerative changes of the knee or ankle or tendinitis of the Achilles I would defer to the expertise of the orthopedic service.  Patient has seen Dr. Posey Pronto in the past and I would recommend that the [facility] clinic orthopedic service be consulted.  At this point I do not feel compelled to initiate a heparin drip I would think standard DVT prophylaxis with Lovenox should be adequate.  2. History of DVT (deep vein thrombosis) Recommend:    No surgery or intervention at this point in time.  IVC filter is not indicated at present.   The patient is tolerating anticoagulation    Elevation was stressed, use of a recliner was discussed.   I have had a long discussion with the patient regarding DVT and post phlebitic changes such as swelling and why it  causes symptoms such as pain.  The patient will wear  graduated compression stockings class 1 (20-30 mmHg), beginning after three full days of anticoagulation, on a daily basis a prescription was given. The patient will  beginning wearing the stockings first thing in the morning and removing them in the evening. The patient is instructed specifically not to sleep in the stockings.  In addition, behavioral modification including elevation during the day and avoidance of prolonged dependency will be initiated.     The patient will continue anticoagulation for now as there have not been any problems or complications at this point.      3. DDD (degenerative disc disease), lumbosacral Continue NSAID medications as already ordered, these medications have been reviewed and there are no changes at this time.   Continued activity and therapy was stressed.     4. Hyperlipemia, mixed Continue statin as ordered and reviewed, no changes at this time   Hortencia Pilar, MD  03/27/2021 6:00 PM

## 2021-03-27 NOTE — ED Notes (Addendum)
Pt ready to go up to floor per Mikeal Hawthorne, [place]

## 2021-03-27 NOTE — Consult Note (Signed)
ANTICOAGULATION CONSULT NOTE - Initial Consult  Pharmacy Consult for heparin Indication: DVT  Allergies  Allergen Reactions   Aspirin Other (See Comments)    unknown   Ivp Dye [Iodinated Contrast Media] Nausea And Vomiting   Nsaids Other (See Comments)    Unknown   Latex Rash    Patient Measurements: Height: 5\' 6"  (167.6 cm) Weight: 78.7 kg (173 lb 8 oz) IBW/kg (Calculated) : 59.3 Heparin Dosing Weight: 75.5kg  Vital Signs: Temp: 98.2 F (36.8 C) (01/18 1416) Temp Source: Oral (01/18 1416) BP: 124/66 (01/18 1416) Pulse Rate: 78 (01/18 1416)  Labs: Recent Labs    03/27/21 1400  HGB 13.1  HCT 39.6  PLT 235  CREATININE 0.72  CKTOTAL 75    Estimated Creatinine Clearance: 66.3 mL/min (by C-G formula based on SCr of 0.72 mg/dL).   Medical History: Past Medical History:  Diagnosis Date   Arthritis    Basal cell carcinoma 12/28/2019   R upper buttock   Complication of anesthesia    COPD (chronic obstructive pulmonary disease) ([place])    patient denies   DVT (deep venous thrombosis) ([place]) 04/01/2017   left lower leg   GERD (gastroesophageal reflux disease)    Headache    Heart murmur    Peripheral vascular disease (HCC)    phlebitis   Phlebitis    PONV (postoperative nausea and vomiting)    Skin cancer 03/2019   Right wrist BCC/SCC?    Medications:  PTA: Apixaban 5mg  BID (patient reports not taking) Inpatient: (Heparin infusion 1/18 >>>) Allergies: Aspirin: Bleeding ulcers in the stomach   Date Time aPTT/HL Rate/Comment       Baseline Labs: aPTT - pending Hgb - 13.1 Plts - 235  Assessment: 76 y.o. female with a past medical history of DVT not currently on anticoagulation, GERD, PVD and some chronic lymphedema in the lower extremities and venous insufficiency who presents for assessment approximately 2 weeks of some pain and soreness in the right posterior calf and the lower aspect around the Achilles tendon.Pharmacy consulted for heparin management  in the setting of DVT.   Goal of Therapy:  Heparin level 0.3-0.7 units/ml Monitor platelets by anticoagulation protocol: Yes   Plan:  Give 5000 units bolus x1; then start heparin infusion at 1250 units/hr Check Anti-Xa level in 6 hours and daily once consecutively therapeutic.  Continue to monitor H&H and platelets daily while on heparin gtt.Darrick Penna 03/27/2021,3:47 PM

## 2021-03-27 NOTE — H&P (Addendum)
History and Physical    Jeanette Harris:[id number] DOB: March 15, 1945 DOA: 03/27/2021 PCP: Jeanette Elk, MD  Chief Complaint: RLE pain Historian: patient HPI:  Jeanette Harris is a 76 y.o. female with a PMH significant for h/o DVT, chronic venous insufficiency, DDD, GERD, HLD, IBS, peripheral neuropathy, restless leg syndrome, OA. They presented from home to the ED on 03/27/2021 with exertional pain in right calf and ankle x14 days. She states that she has had calf pain before but this is worse. Pain of right calf is so high that she has not been able to ambulate or lay her leg on any surface. She follows with vascular surgery for chronic venous insufficiency. Denies chest pain, SOB.  In the ED, it was found that they had difficult to find PT with doppler. LE doppler was negative for DVT.  Vascular surgery was consulted. They were treated with heparin drip with tentative plan for vascular intervention in am.  Patient was admitted to medicine service for further workup and management of RLE arterial disease as outlined in detail below.  Review of Systems: As per HPI otherwise 10 point review of systems negative.   Past Medical History:  Diagnosis Date   Arthritis    Basal cell carcinoma 12/28/2019   R upper buttock   Complication of anesthesia    COPD (chronic obstructive pulmonary disease) ([place])    patient denies   DVT (deep venous thrombosis) ([place]) 04/01/2017   left lower leg   GERD (gastroesophageal reflux disease)    Headache    Heart murmur    Peripheral vascular disease (HCC)    phlebitis   Phlebitis    PONV (postoperative nausea and vomiting)    Skin cancer 03/2019   Right wrist BCC/SCC?    Past Surgical History:  Procedure Laterality Date   ABDOMINAL HYSTERECTOMY     ANKLE SURGERY     APPENDECTOMY     BACK SURGERY     CATARACT EXTRACTION W/PHACO Left 02/13/2016   Procedure: CATARACT EXTRACTION PHACO AND INTRAOCULAR LENS PLACEMENT ([place]);  Surgeon: Leandrew Koyanagi, MD;  Location: [facility];  Service: Ophthalmology;  Laterality: Left;  TORIC   CATARACT EXTRACTION W/PHACO Right 02/27/2016   Procedure: CATARACT EXTRACTION PHACO AND INTRAOCULAR LENS PLACEMENT ([facility]);  Surgeon: Leandrew Koyanagi, MD;  Location: [facility];  Service: Ophthalmology;  Laterality: Right;  TORIC Latex sensitivity   CERVICAL FUSION  1992   CHOLECYSTECTOMY     COLONOSCOPY     COLONOSCOPY WITH PROPOFOL N/A 04/19/2018   Procedure: COLONOSCOPY WITH PROPOFOL;  Surgeon: Manya Silvas, MD;  Location[facility] ENDOSCOPY;  Service: Endoscopy;  Laterality: N/A;   ESOPHAGOGASTRODUODENOSCOPY     ESOPHAGOGASTRODUODENOSCOPY (EGD) WITH PROPOFOL N/A 04/19/2018   Procedure: ESOPHAGOGASTRODUODENOSCOPY (EGD) WITH PROPOFOL;  Surgeon: Manya Silvas, MD;  Location: [facility] ENDOSCOPY;  Service: Endoscopy;  Laterality: N/A;   feet surgery  2000   FOOT SURGERY     KNEE ARTHROSCOPY WITH MENISCAL REPAIR Left 10/01/2017   Procedure: KNEE ARTHROSCOPY PARTIAL AND LATERAL MENISCECTOMY AND CHONDROPLASTY;  Surgeon: Leim Fabry, MD;  Location: [facility];  Service: Orthopedics;  Laterality: Left;   right ankle surgery  1977   SHOULDER ARTHROSCOPY       reports that she quit smoking about 43 years ago. Her smoking use included cigarettes. She has a 1.50 pack-year smoking history. She has never used smokeless tobacco. She reports that she does not drink alcohol and does not use drugs.  Allergies  Allergen  Reactions   Aspirin Other (See Comments)    unknown   Ivp Dye [Iodinated Contrast Media] Nausea And Vomiting   Nsaids Other (See Comments)    Unknown   Latex Rash    Family History  Problem Relation Age of Onset   Breast cancer Maternal Aunt    Diabetes Mother    Heart disease Mother    Prostate cancer Father    Hypertension Father    Breast cancer Cousin    Colon cancer Cousin    Breast cancer Cousin    Lung cancer Maternal Aunt     Prior to Admission  medications   Medication Sig Start Date End Date Taking? Authorizing Provider  acetaminophen (TYLENOL) 325 MG tablet Take 1,000 mg by mouth in the morning and at bedtime.    [provider]  apixaban (ELIQUIS) 5 MG TABS tablet Take 1 tablet (5 mg total) by mouth 2 (two) times daily for 14 days. Patient not taking: Reported on 03/16/2020 10/02/17 10/16/17  Leim Fabry, MD  calcium-vitamin D (OSCAL WITH D) 500-200 MG-UNIT tablet Take 1 tablet by mouth.    [provider]  docusate sodium (COLACE) 250 MG capsule Take 250 mg by mouth daily.    [provider]  lansoprazole (PREVACID) 30 MG capsule Take 30 mg by mouth daily at 12 noon.    [provider]  nortriptyline (PAMELOR) 75 MG capsule Take 75 mg by mouth at bedtime.    [provider]  Potassium 95 MG TABS Take 1 tablet by mouth daily.    [provider]  sucralfate (CARAFATE) 1 g tablet Take 1 g by mouth 4 (four) times daily -  with meals and at bedtime.    [provider]  topiramate (TOPAMAX) 100 MG tablet Take 50 mg by mouth 2 (two) times daily.    [provider]  vitamin E 180 MG (400 UNITS) capsule Take by mouth.    [provider]   I have personally, briefly reviewed patient's prior medical records in [facility]  Physical Exam: Vitals:   03/27/21 1154 03/27/21 1200 03/27/21 1416  BP:  126/68 124/66  Pulse:  75 78  Resp:  16 17  Temp:  98.2 F (36.8 C) 98.2 F (36.8 C)  TempSrc:  Oral Oral  SpO2:  100% 100%  Weight: 78.7 kg    Height: 5\' 6"  (1.676 m)      Constitutional: NAD, calm, comfortable HEENT: lids and conjunctivae normal Mucous membranes are moist. Posterior pharynx clear of any exudate or lesions.Normal dentition.  Neck: normal, supple, no masses, no thyromegaly Respiratory: CTAB, no wheezing, no crackles. Normal respiratory effort. No accessory muscle use.  Cardiovascular: RRR, no murmurs / rubs / gallops. Warm lower  extremities. Weak pedal and posterior tibial pulses palpated bilaterally. Chronic venous stasis changes. Non-pitting edema to knee. Abdomen: soft, NT, ND, no masses or HSM palpated. Musculoskeletal: No joint deformity upper and lower extremities. Normal muscle tone.  Skin: dry, intact, normal color, normal temperature Neurologic: Alert and oriented x 3. Normal speech.  Grossly non-focal exam. PERRL Psychiatric: Normal mood. Congruent affect.  Normal judgement and insight.  Labs on Admission: I have personally reviewed following labs and imaging studies  CBC: Recent Labs  Lab 03/27/21 1400  WBC 5.1  NEUTROABS 3.0  HGB 13.1  HCT 39.6  MCV 101.5*  PLT 390   Basic Metabolic Panel: Recent Labs  Lab 03/27/21 1400  NA 135  K 3.8  CL 105  CO2 24  GLUCOSE 96  BUN 15  CREATININE 0.72  CALCIUM 9.4   GFR: Estimated Creatinine Clearance: 66.3 mL/min (by C-G formula based on SCr of 0.72 mg/dL).  Cardiac Enzymes: Recent Labs  Lab 03/27/21 1400  CKTOTAL 75   Urine analysis:    Component Value Date/Time   COLORURINE YELLOW (A) 12/25/2018 0455   APPEARANCEUR TURBID (A) 12/25/2018 0455   APPEARANCEUR Clear 04/07/2012 0241   LABSPEC 1.013 12/25/2018 0455   LABSPEC 1.009 04/07/2012 0241   PHURINE 6.0 12/25/2018 0455   GLUCOSEU NEGATIVE 12/25/2018 0455   GLUCOSEU Negative 04/07/2012 0241   HGBUR LARGE (A) 12/25/2018 0455   BILIRUBINUR NEGATIVE 12/25/2018 0455   BILIRUBINUR Negative 04/07/2012 [place] 12/25/2018 0455   PROTEINUR 30 (A) 12/25/2018 0455   NITRITE NEGATIVE 12/25/2018 0455   LEUKOCYTESUR LARGE (A) 12/25/2018 0455   LEUKOCYTESUR Negative 04/07/2012 0241    Radiological Exams on Admission: US Venous Img Lower Unilateral Right  Result Date: 03/27/2021 CLINICAL DATA:  Right calf pain. EXAM: RIGHT LOWER EXTREMITY VENOUS DOPPLER ULTRASOUND TECHNIQUE: Gray-scale sonography with compression, as well as color and duplex ultrasound, were performed to  evaluate the deep venous system(s) from the level of the common femoral vein through the popliteal and proximal calf veins. COMPARISON:  None. FINDINGS: VENOUS Normal compressibility of the common femoral, superficial femoral, and popliteal veins, as well as the visualized calf veins. Visualized portions of profunda femoral vein and great saphenous vein unremarkable. No filling defects to suggest DVT on grayscale or color Doppler imaging. Doppler waveforms show normal direction of venous flow, normal respiratory plasticity and response to augmentation. Limited views of the contralateral common femoral vein are unremarkable. OTHER None. Limitations: none IMPRESSION: Negative. Electronically Signed   By: Misty Stanley M.D.   On: 03/27/2021 13:12    EKG: Independently reviewed. NSR HR 70  Assessment/Plan Active Problems:   * No active hospital problems. *   R calf pain- presumed from PAD. Vascular surgery evaluated patient clinically and reviewed her ABIs which were normal. Negative DVT study. Potentially small vessel disease or intraarticular. Vascular recommended ortho consult.  - analgesia PRN - CTA runoff if kidney function tolerates - consider ortho surgery consult - continue heparin gtt overnight as etiology remains unclear and history is strongly positive for exertional pain  - restart home eliquis when discontinued - consider pletal in future - PT for exercise therapy  H/o DVT- none seen on admission imaging. Chronically on eliquis - continue eliquis when heparin gtt stopeed  GERD-  - continue home medications  Anxiety/depression   restless leg syndrome - continue home topiramate and nortriptyline  DDD- chronic, stable. Potentially contributing to current presenting pain as radicular pain  H/o BSC- treated in 2021  HLD- - continue home statin   DVT prophylaxis: heparin gtt   Code Status: full  Family Communication: none  Disposition Plan: TBD  Consults called: vascular  surgery   Richarda Osmond, DO [facility]  03/27/2021, 3:38 PM    To contact the appropriate [facility] Attending or Consulting provider: Check the care team in [facility] for a) attending/consulting [facility] provider name and b) the [facility] team listed Log into [email or web address] and use [place]'s universal password to access. If you do not have the password, please contact the hospital operator. Locate the [facility] provider you are looking for under [facility] and page to a number that you can be directly reached. (Text pages appreciated) If you still have difficulty reaching the  provider, please page the [facility] (Director on Call) for the Hospitalists listed on amion for assistance.

## 2021-03-28 ENCOUNTER — Telehealth: Payer: Self-pay | Admitting: Nurse Practitioner

## 2021-03-28 ENCOUNTER — Observation Stay: Payer: Medicare Other

## 2021-03-28 DIAGNOSIS — G2581 Restless legs syndrome: Secondary | ICD-10-CM

## 2021-03-28 DIAGNOSIS — F419 Anxiety disorder, unspecified: Secondary | ICD-10-CM

## 2021-03-28 DIAGNOSIS — M79604 Pain in right leg: Secondary | ICD-10-CM

## 2021-03-28 DIAGNOSIS — K219 Gastro-esophageal reflux disease without esophagitis: Secondary | ICD-10-CM

## 2021-03-28 DIAGNOSIS — F32A Depression, unspecified: Secondary | ICD-10-CM

## 2021-03-28 LAB — BASIC METABOLIC PANEL
Anion gap: 4 — ABNORMAL LOW (ref 5–15)
BUN: 17 mg/dL (ref 8–23)
CO2: 24 mmol/L (ref 22–32)
Calcium: 9 mg/dL (ref 8.9–10.3)
Chloride: 109 mmol/L (ref 98–111)
Creatinine, Ser: 0.85 mg/dL (ref 0.44–1.00)
GFR, Estimated: >60 mL/min (ref >60)
Glucose, Bld: 103 mg/dL — ABNORMAL HIGH (ref 70–99)
Potassium: 4 mmol/L (ref 3.5–5.1)
Sodium: 137 mmol/L (ref 135–145)

## 2021-03-28 LAB — CBC
HCT: 36.8 % (ref 36.0–46.0)
Hemoglobin: 12.4 g/dL (ref 12.0–15.0)
MCH: 33.8 pg (ref 26.0–34.0)
MCHC: 33.7 g/dL (ref 30.0–36.0)
MCV: 100.3 fL — ABNORMAL HIGH (ref 80.0–100.0)
Platelets: 230 10*3/uL (ref 150–400)
RBC: 3.67 MIL/uL — ABNORMAL LOW (ref 3.87–5.11)
RDW: 12.4 % (ref 11.5–15.5)
WBC: 6.1 10*3/uL (ref 4.0–10.5)
nRBC: 0 % (ref 0.0–0.2)

## 2021-03-28 MED ORDER — ENOXAPARIN SODIUM 40 MG/0.4ML IJ SOSY
40.0000 mg | PREFILLED_SYRINGE | INTRAMUSCULAR | Status: DC
  Administered 2021-03-28: 40 mg via SUBCUTANEOUS
  Filled 2021-03-28: qty 0.4

## 2021-03-28 NOTE — Discharge Summary (Signed)
[place] [facility] [place] NAME: Jeanette Harris    MR#:  [id number]  DATE OF BIRTH:  April 07, 1945  DATE OF ADMISSION:  03/27/2021 ADMITTING PHYSICIAN: Loletha Grayer, MD  DATE OF DISCHARGE: 03/28/2021  PRIMARY CARE PHYSICIAN: Marinda Elk, MD    ADMISSION DIAGNOSIS:  Claudication [facility]) [I73.9] Right leg pain [M79.604] PAD (peripheral artery disease) ([place]) [I73.9] Rest pain of lower extremity due to atherosclerosis ([place]) [I70.229] Leg pain, diffuse, right [M79.604]  DISCHARGE DIAGNOSIS:  Right leg pain Achilles tendinitis  SECONDARY DIAGNOSIS:   Past Medical History:  Diagnosis Date   Arthritis    Basal cell carcinoma 12/28/2019   R upper buttock   Complication of anesthesia    COPD (chronic obstructive pulmonary disease) ([place])    patient denies   DVT (deep venous thrombosis) ([place]) 04/01/2017   left lower leg   GERD (gastroesophageal reflux disease)    Headache    Heart murmur    Peripheral vascular disease (HCC)    phlebitis   Phlebitis    PONV (postoperative nausea and vomiting)    Skin cancer 03/2019   Right wrist BCC/SCC?    HOSPITAL COURSE:   Right leg pain.  Suspected Achilles tendinitis.  Ultrasound negative for DVT.  ABI normal range, so this is not an arterial thrombus.  Heparin drip discontinued.  The patient did have some tenderness along her Achilles tendon.  Appreciate orthopedic consultation.  Can do Salonpas as outpatient, ice for the first 24 to 48 hours then go to heat.  The patient cannot take anti-inflammatories.  She will do Tylenol as needed for pain.  Follow-up with Dr. Posey Pronto orthopedics at the end of the month Prior history of DVT in the past.  Not on Eliquis at this time. GERD on Protonix Anxiety, depression and restless leg syndrome on Topamax and nortriptyline   DISCHARGE CONDITIONS:   Satisfactory  CONSULTS OBTAINED:  Treatment Team:  Katha Cabal, MD Lovell Sheehan, MD  DRUG  ALLERGIES:   Allergies  Allergen Reactions   Aspirin Other (See Comments)    Bleeding ulcers in the stomach   Ivp Dye [Iodinated Contrast Media] Nausea And Vomiting   Nsaids Other (See Comments)    Unknown   Latex Rash    DISCHARGE MEDICATIONS:   Allergies as of 03/28/2021       Reactions   Aspirin Other (See Comments)   Bleeding ulcers in the stomach   Ivp Dye [iodinated Contrast Media] Nausea And Vomiting   Nsaids Other (See Comments)   Unknown   Latex Rash        Medication List     STOP taking these medications    apixaban 5 MG Tabs tablet Commonly known as: Eliquis       TAKE these medications    acetaminophen 325 MG tablet Commonly known as: TYLENOL Take 1,000 mg by mouth in the morning and at bedtime.   calcium-vitamin D 500-200 MG-UNIT tablet Commonly known as: OSCAL WITH D Take 1 tablet by mouth.   docusate sodium 50 MG capsule Commonly known as: COLACE Take 50 mg by mouth 2 (two) times daily.   lansoprazole 30 MG capsule Commonly known as: PREVACID Take 30 mg by mouth daily at 12 noon.   nortriptyline 75 MG capsule Commonly known as: PAMELOR Take 75 mg by mouth at bedtime.   Potassium 95 MG Tabs Take 1 tablet by mouth daily.   sucralfate 1 g tablet Commonly known as: CARAFATE  Take 1 g by mouth 4 (four) times daily -  with meals and at bedtime.   topiramate 100 MG tablet Commonly known as: TOPAMAX Take 100 mg by mouth 2 (two) times daily.   vitamin E 180 MG (400 UNITS) capsule Take by mouth.         DISCHARGE INSTRUCTIONS:   Follow-up PMD 5 days Follow-up orthopedic surgery as outpatient  If you experience worsening of your admission symptoms, develop shortness of breath, life threatening emergency, suicidal or homicidal thoughts you must seek medical attention immediately by calling 911 or calling your MD immediately  if symptoms less severe.  You Must read complete instructions/literature along with all the possible  adverse reactions/side effects for all the Medicines you take and that have been prescribed to you. Take any new Medicines after you have completely understood and accept all the possible adverse reactions/side effects.   Please note  You were cared for by a hospitalist during your hospital stay. If you have any questions about your discharge medications or the care you received while you were in the hospital after you are discharged, you can call the unit and asked to speak with the hospitalist on call if the hospitalist that took care of you is not available. Once you are discharged, your primary care physician will handle any further medical issues. Please note that NO REFILLS for any discharge medications will be authorized once you are discharged, as it is imperative that you return to your primary care physician (or establish a relationship with a primary care physician if you do not have one) for your aftercare needs so that they can reassess your need for medications and monitor your lab values.    Today   CHIEF COMPLAINT:   Chief Complaint  Patient presents with   Leg Pain    HISTORY OF PRESENT ILLNESS:  Jeanette Harris  is a 76 y.o. female came in with right leg pain   VITAL SIGNS:  Blood pressure 110/66, pulse 70, temperature 98.2 F (36.8 C), resp. rate 16, height 5\' 6"  (1.676 m), weight 78.7 kg, SpO2 100 %.  I/O:   Intake/Output Summary (Last 24 hours) at 03/28/2021 1550 Last data filed at 03/28/2021 1037 Gross per 24 hour  Intake 351.46 ml  Output 1800 ml  Net -1448.54 ml    PHYSICAL EXAMINATION:  GENERAL:  76 y.o.-year-old patient lying in the bed with no acute distress.  EYES: Pupils equal, round, reactive to light and accommodation. No scleral icterus. Extraocular muscles intact.  HEENT: Head atraumatic, normocephalic. Oropharynx and nasopharynx clear.   LUNGS: Normal breath sounds bilaterally, no wheezing, rales,rhonchi or crepitation. No use of accessory muscles  of respiration.  CARDIOVASCULAR: S1, S2 normal. No murmurs, rubs, or gallops.  ABDOMEN: Soft, non-tender, non-distended. Bowel sounds present.  EXTREMITIES: No pedal edema,  NEUROLOGIC: Cranial nerves II through XII are intact. Muscle strength 5/5 in all extremities. Sensation intact. Gait not checked.  PSYCHIATRIC: The patient is alert and oriented x 3.  SKIN: chronic lower extremity discoloration and redness  DATA REVIEW:   CBC Recent Labs  Lab 03/28/21 0437  WBC 6.1  HGB 12.4  HCT 36.8  PLT 230    Chemistries  Recent Labs  Lab 03/28/21 0437  NA 137  K 4.0  CL 109  CO2 24  GLUCOSE 103*  BUN 17  CREATININE 0.85  CALCIUM 9.0     Microbiology Results  Results for orders placed or performed during the hospital encounter of 03/27/21  Resp Panel by RT-PCR (Flu A&B, Covid) Nasopharyngeal Swab     Status: None   Collection Time: 03/27/21  3:24 PM   Specimen: Nasopharyngeal Swab; Nasopharyngeal(NP) swabs in vial transport medium  Result Value Ref Range Status   SARS Coronavirus 2 by RT PCR NEGATIVE NEGATIVE Final    Comment: (NOTE) SARS-CoV-2 target nucleic acids are NOT DETECTED.  The SARS-CoV-2 RNA is generally detectable in upper respiratory specimens during the acute phase of infection. The lowest concentration of SARS-CoV-2 viral copies this assay can detect is 138 copies/mL. A negative result does not preclude SARS-Cov-2 infection and should not be used as the sole basis for treatment or other patient management decisions. A negative result may occur with  improper specimen collection/handling, submission of specimen other than nasopharyngeal swab, presence of viral mutation(s) within the areas targeted by this assay, and inadequate number of viral copies(<138 copies/mL). A negative result must be combined with clinical observations, patient history, and epidemiological information. The expected result is Negative.  Fact Sheet for Patients:   EntrepreneurPulse.com.au  Fact Sheet for Healthcare Providers:  IncredibleEmployment.be  This test is no t yet approved or cleared by the [place] FDA and  has been authorized for detection and/or diagnosis of SARS-CoV-2 by FDA under an Emergency Use Authorization (EUA). This EUA will remain  in effect (meaning this test can be used) for the duration of the COVID-19 declaration under Section 564(b)(1) of the Act, 21 U.S.C.section 360bbb-3(b)(1), unless the authorization is terminated  or revoked sooner.       Influenza A by PCR NEGATIVE NEGATIVE Final   Influenza B by PCR NEGATIVE NEGATIVE Final    Comment: (NOTE) The Xpert Xpress SARS-CoV-2/FLU/RSV plus assay is intended as an aid in the diagnosis of influenza from Nasopharyngeal swab specimens and should not be used as a sole basis for treatment. Nasal washings and aspirates are unacceptable for Xpert Xpress SARS-CoV-2/FLU/RSV testing.  Fact Sheet for Patients: EntrepreneurPulse.com.au  Fact Sheet for Healthcare Providers: IncredibleEmployment.be  This test is not yet approved or cleared by the [place] FDA and has been authorized for detection and/or diagnosis of SARS-CoV-2 by FDA under an Emergency Use Authorization (EUA). This EUA will remain in effect (meaning this test can be used) for the duration of the COVID-19 declaration under Section 564(b)(1) of the Act, 21 U.S.C. section 360bbb-3(b)(1), unless the authorization is terminated or revoked.  Performed at [facility], [address] [place],  [address]     RADIOLOGY:  DG Tibia/Fibula Right  Result Date: 03/28/2021 CLINICAL DATA:  Right lower leg pain posteriorly. EXAM: RIGHT TIBIA AND FIBULA - 2 VIEW COMPARISON:  None. FINDINGS: Normal bone mineralization. The ankle mortise appears symmetric and intact. Mild lateral compartment of the knee joint space narrowing.  No knee joint effusion. No acute fracture or dislocation. IMPRESSION: No acute fracture. Electronically Signed   By: Yvonne Kendall M.D.   On: 03/28/2021 13:07   US Venous Img Lower Unilateral Right  Result Date: 03/27/2021 CLINICAL DATA:  Right calf pain. EXAM: RIGHT LOWER EXTREMITY VENOUS DOPPLER ULTRASOUND TECHNIQUE: Gray-scale sonography with compression, as well as color and duplex ultrasound, were performed to evaluate the deep venous system(s) from the level of the common femoral vein through the popliteal and proximal calf veins. COMPARISON:  None. FINDINGS: VENOUS Normal compressibility of the common femoral, superficial femoral, and popliteal veins, as well as the visualized calf veins. Visualized portions of profunda femoral vein and great saphenous vein unremarkable. No filling defects  to suggest DVT on grayscale or color Doppler imaging. Doppler waveforms show normal direction of venous flow, normal respiratory plasticity and response to augmentation. Limited views of the contralateral common femoral vein are unremarkable. OTHER None. Limitations: none IMPRESSION: Negative. Electronically Signed   By: Misty Stanley M.D.   On: 03/27/2021 13:12   US ARTERIAL ABI (SCREENING LOWER EXTREMITY)  Result Date: 03/27/2021 CLINICAL DATA:  76 year old female with right leg pain EXAM: NONINVASIVE PHYSIOLOGIC VASCULAR STUDY OF BILATERAL LOWER EXTREMITIES TECHNIQUE: Evaluation of both lower extremities were performed at rest, including calculation of ankle-brachial indices with single level Doppler, pressure and pulse volume recording. COMPARISON:  None. FINDINGS: Right ABI:  1.1 Left ABI:  1.1 Right Lower Extremity:  Normal arterial waveforms at the ankle. Left Lower Extremity:  Normal arterial waveforms at the ankle. 1.0-1.4 Normal IMPRESSION: Normal exam. Electronically Signed   By: Jacqulynn Cadet M.D.   On: 03/27/2021 16:52      Management plans discussed with the patient, and she is in  agreement.  CODE STATUS:     Code Status Orders  (From admission, onward)           Start     Ordered   03/27/21 1548  Full code  Continuous        03/27/21 1549           Code Status History     This patient has a current code status but no historical code status.       TOTAL TIME TAKING CARE OF THIS PATIENT: 35 minutes.    Loletha Grayer M.D on 03/28/2021 at 3:50 PM    [facility]  CC: Primary care physician; Marinda Elk, MD

## 2021-03-28 NOTE — Care Management CC44 (Signed)
Condition Code 44 Documentation Completed  Patient Details  Name: Jeanette Harris MRN: [id number] Date of Birth: 1945-05-02   Condition Code 44 given:  Yes Patient signature on Condition Code 44 notice:  Yes Documentation of 2 MD's agreement:  Yes Code 44 added to claim:  Yes    Beverly Sessions, RN 03/28/2021, 1:11 PM

## 2021-03-28 NOTE — Care Management Obs Status (Signed)
[place] NOTIFICATION   Patient Details  Name: SHIAN GOODNOW MRN: [id number] Date of Birth: Jan 12, 1946   Medicare Observation Status Notification Given:  Yes    Beverly Sessions, RN 03/28/2021, 1:11 PM

## 2021-03-28 NOTE — Consult Note (Addendum)
ORTHOPAEDIC CONSULTATION  REQUESTING PHYSICIAN: Loletha Grayer, MD  Chief Complaint: right lower leg pain  HPI: Jeanette Harris is a 76 y.o. female who complains of right lower leg pain x 14 days at the right ankle posteriorly radiating distal to proximally into the calfThe pain is sharp in character. The pain is severe and 10/10. The pain is worse with movement and better with rest.   She has had some chronic purplish discoloration of the dorsum of both feet which is not different today than usual.  She does not recall any injuries. DVT was ruled out. Ortho consulted.  Patient has seen Dr. Posey Pronto in the past for other ortho issues.  Denies any numbness, tingling or constitutional symptoms.  Past Medical History:  Diagnosis Date   Arthritis    Basal cell carcinoma 12/28/2019   R upper buttock   Complication of anesthesia    COPD (chronic obstructive pulmonary disease) ([place])    patient denies   DVT (deep venous thrombosis) ([place]) 04/01/2017   left lower leg   GERD (gastroesophageal reflux disease)    Headache    Heart murmur    Peripheral vascular disease (HCC)    phlebitis   Phlebitis    PONV (postoperative nausea and vomiting)    Skin cancer 03/2019   Right wrist BCC/SCC?   Past Surgical History:  Procedure Laterality Date   ABDOMINAL HYSTERECTOMY     ANKLE SURGERY     APPENDECTOMY     BACK SURGERY     CATARACT EXTRACTION W/PHACO Left 02/13/2016   Procedure: CATARACT EXTRACTION PHACO AND INTRAOCULAR LENS PLACEMENT ([place]);  Surgeon: Leandrew Koyanagi, MD;  Location: [place];  Service: Ophthalmology;  Laterality: Left;  TORIC   CATARACT EXTRACTION W/PHACO Right 02/27/2016   Procedure: CATARACT EXTRACTION PHACO AND INTRAOCULAR LENS PLACEMENT ([facility]);  Surgeon: Leandrew Koyanagi, MD;  Location: [place];  Service: Ophthalmology;  Laterality: Right;  TORIC Latex sensitivity   CERVICAL FUSION  1992   CHOLECYSTECTOMY     COLONOSCOPY     COLONOSCOPY WITH  PROPOFOL N/A 04/19/2018   Procedure: COLONOSCOPY WITH PROPOFOL;  Surgeon: Manya Silvas, MD;  Location[facility] ENDOSCOPY;  Service: Endoscopy;  Laterality: N/A;   ESOPHAGOGASTRODUODENOSCOPY     ESOPHAGOGASTRODUODENOSCOPY (EGD) WITH PROPOFOL N/A 04/19/2018   Procedure: ESOPHAGOGASTRODUODENOSCOPY (EGD) WITH PROPOFOL;  Surgeon: Manya Silvas, MD;  Location: [facility] ENDOSCOPY;  Service: Endoscopy;  Laterality: N/A;   feet surgery  2000   FOOT SURGERY     KNEE ARTHROSCOPY WITH MENISCAL REPAIR Left 10/01/2017   Procedure: KNEE ARTHROSCOPY PARTIAL AND LATERAL MENISCECTOMY AND CHONDROPLASTY;  Surgeon: Leim Fabry, MD;  Location: [place];  Service: Orthopedics;  Laterality: Left;   right ankle surgery  1977   SHOULDER ARTHROSCOPY     Social History   Socioeconomic History   Marital status: Married    Spouse name: Not on file   Number of children: Not on file   Years of education: Not on file   Highest education level: Not on file  Occupational History   Not on file  Tobacco Use   Smoking status: Former    Packs/day: 0.50    Years: 3.00    Pack years: 1.50    Types: Cigarettes    Quit date: 5    Years since quitting: 43.0   Smokeless tobacco: Never   Tobacco comments:    only smoked for about 3 years   Vaping Use   Vaping Use: Never used  Substance  and Sexual Activity   Alcohol use: No   Drug use: No   Sexual activity: Not on file  Other Topics Concern   Not on file  Social History Narrative   Not on file   Social Determinants of Health   Financial Resource Strain: Not on file  Food Insecurity: Not on file  Transportation Needs: Not on file  Physical Activity: Not on file  Stress: Not on file  Social Connections: Not on file   Family History  Problem Relation Age of Onset   Breast cancer Maternal Aunt    Diabetes Mother    Heart disease Mother    Prostate cancer Father    Hypertension Father    Breast cancer Cousin    Colon cancer Cousin    Breast  cancer Cousin    Lung cancer Maternal Aunt    Allergies  Allergen Reactions   Aspirin Other (See Comments)    Bleeding ulcers in the stomach   Ivp Dye [Iodinated Contrast Media] Nausea And Vomiting   Nsaids Other (See Comments)    Unknown   Latex Rash   Prior to Admission medications   Medication Sig Start Date End Date Taking? Authorizing Provider  acetaminophen (TYLENOL) 325 MG tablet Take 1,000 mg by mouth in the morning and at bedtime.   Yes [provider]  calcium-vitamin D (OSCAL WITH D) 500-200 MG-UNIT tablet Take 1 tablet by mouth.   Yes [provider]  docusate sodium (COLACE) 50 MG capsule Take 50 mg by mouth 2 (two) times daily.   Yes [provider]  lansoprazole (PREVACID) 30 MG capsule Take 30 mg by mouth daily at 12 noon.   Yes [provider]  nortriptyline (PAMELOR) 75 MG capsule Take 75 mg by mouth at bedtime.   Yes [provider]  Potassium 95 MG TABS Take 1 tablet by mouth daily.   Yes [provider]  sucralfate (CARAFATE) 1 g tablet Take 1 g by mouth 4 (four) times daily -  with meals and at bedtime.   Yes [provider]  topiramate (TOPAMAX) 100 MG tablet Take 100 mg by mouth 2 (two) times daily.   Yes [provider]  vitamin E 180 MG (400 UNITS) capsule Take by mouth.   Yes [provider]  apixaban (ELIQUIS) 5 MG TABS tablet Take 1 tablet (5 mg total) by mouth 2 (two) times daily for 14 days. Patient not taking: Reported on 03/16/2020 10/02/17 10/16/17  Leim Fabry, MD   DG Tibia/Fibula Right  Result Date: 03/28/2021 CLINICAL DATA:  Right lower leg pain posteriorly. EXAM: RIGHT TIBIA AND FIBULA - 2 VIEW COMPARISON:  None. FINDINGS: Normal bone mineralization. The ankle mortise appears symmetric and intact. Mild lateral compartment of the knee joint space narrowing. No knee joint effusion. No acute fracture or dislocation. IMPRESSION: No acute fracture. Electronically Signed   By:  Yvonne Kendall M.D.   On: 03/28/2021 13:07   US Venous Img Lower Unilateral Right  Result Date: 03/27/2021 CLINICAL DATA:  Right calf pain. EXAM: RIGHT LOWER EXTREMITY VENOUS DOPPLER ULTRASOUND TECHNIQUE: Gray-scale sonography with compression, as well as color and duplex ultrasound, were performed to evaluate the deep venous system(s) from the level of the common femoral vein through the popliteal and proximal calf veins. COMPARISON:  None. FINDINGS: VENOUS Normal compressibility of the common femoral, superficial femoral, and popliteal veins, as well as the visualized calf veins. Visualized portions of profunda femoral vein and great saphenous vein unremarkable. No  filling defects to suggest DVT on grayscale or color Doppler imaging. Doppler waveforms show normal direction of venous flow, normal respiratory plasticity and response to augmentation. Limited views of the contralateral common femoral vein are unremarkable. OTHER None. Limitations: none IMPRESSION: Negative. Electronically Signed   By: Misty Stanley M.D.   On: 03/27/2021 13:12   US ARTERIAL ABI (SCREENING LOWER EXTREMITY)  Result Date: 03/27/2021 CLINICAL DATA:  76 year old female with right leg pain EXAM: NONINVASIVE PHYSIOLOGIC VASCULAR STUDY OF BILATERAL LOWER EXTREMITIES TECHNIQUE: Evaluation of both lower extremities were performed at rest, including calculation of ankle-brachial indices with single level Doppler, pressure and pulse volume recording. COMPARISON:  None. FINDINGS: Right ABI:  1.1 Left ABI:  1.1 Right Lower Extremity:  Normal arterial waveforms at the ankle. Left Lower Extremity:  Normal arterial waveforms at the ankle. 1.0-1.4 Normal IMPRESSION: Normal exam. Electronically Signed   By: Jacqulynn Cadet M.D.   On: 03/27/2021 16:52    Positive ROS: All other systems have been reviewed and were otherwise negative with the exception of those mentioned in the HPI and as above.  Physical Exam: General: Alert, no acute  distress Cardiovascular: No pedal edema Respiratory: No cyanosis, no use of accessory musculature GI: No organomegaly, abdomen is soft and non-tender Skin: No lesions in the area of chief complaint Neurologic: Sensation intact distally Psychiatric: Patient is competent for consent with normal mood and affect Lymphatic: No axillary or cervical lymphadenopathy  MUSCULOSKELETAL: No joint deformity of the right lower extremity. Normal muscle tone. Compartments soft. Good cap refill. Motor and sensory intact distally. Negative homans, Point tenderness at achilles insertion  Assessment: Right lower leg pain  Plan: DVT ruled out Okay to discharge from ortho standpoint Patient is WBAT and wants to follow up with her ortho doc Dr. Posey Pronto Imaging was reviewed by Dr. Harlow Mares and he agrees with plan.   Carlynn Spry, PA-C    03/28/2021 3:19 PM  Notes, chart, imaging and plan of care discussed with patient and Carlynn Spry, PA-C

## 2021-03-28 NOTE — Progress Notes (Signed)
Discharge instructions reviewed with the patient.  Patient sent out via wheelchair with belongings 

## 2021-03-28 NOTE — Telephone Encounter (Signed)
Pt called to reschedule appt for 1-19. Pt is in hospital.call back at [phone]

## 2021-03-29 ENCOUNTER — Ambulatory Visit: Payer: Medicare Other | Admitting: Oncology

## 2021-03-29 ENCOUNTER — Inpatient Hospital Stay: Payer: Medicare Other

## 2021-03-29 ENCOUNTER — Inpatient Hospital Stay: Payer: Medicare Other | Admitting: Nurse Practitioner

## 2021-04-02 ENCOUNTER — Telehealth: Payer: Self-pay | Admitting: Oncology

## 2021-04-02 NOTE — Telephone Encounter (Signed)
Pt called to reschedule appt for 1-20. Call back at [phone]

## 2021-04-03 ENCOUNTER — Other Ambulatory Visit: Payer: Self-pay

## 2021-04-03 ENCOUNTER — Ambulatory Visit
Admission: RE | Admit: 2021-04-03 | Discharge: 2021-04-03 | Disposition: A | Discharge status: Home / Self Care | Payer: Medicare Other | Source: Ambulatory Visit | Attending: Physician Assistant | Admitting: Physician Assistant

## 2021-04-03 DIAGNOSIS — Z1231 Encounter for screening mammogram for malignant neoplasm of breast: Secondary | ICD-10-CM | POA: Diagnosis present

## 2021-04-16 ENCOUNTER — Other Ambulatory Visit: Payer: Self-pay | Admitting: General Surgery

## 2021-04-16 NOTE — Progress Notes (Signed)
Subjective:     Patient ID: Jeanette Harris is a 76 y.o. female.   HPI   The following portions of the patient's history were reviewed and updated as appropriate.   This a new patient is here today for: office visit. The patient has been referred by Boykin Reaper, PA for evaluation of a colonoscopy. Patient reports her last colonosopy was done by Dr. Vira Agar on 04-19-18. She also reports they did an upper endoscopy at that time due to a bleeding ulcer. Patient reports she does have a history of colon polyps. She does have rectal bleeding and reports last week it was dark red blood. Patient states this has been ongoing since her last colonoscopy. The patient states her bowel movements are every day to every other day.   The patient describes pain in the retrosternal area, occasionally at night similar to what she would anticipate with a heart attack.  No symptoms of reflux of undigested material at night.   History: IBS.        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   COPD (chronic obstructive pulmonary disease) (CMS-HCC)     DVT (deep venous thrombosis) (CMS-HCC) 04/01/2017   GERD (gastroesophageal reflux disease)     H/O adenomatous polyp of colon 04/17/2014   H/O mammogram 05/16/2013   History of bone density study 10/15/2011   History of colonic polyps     Hyperlipidemia     Hypertension     Irritable bowel syndrome     Irritable bowel syndrome with constipation and diarrhea 04/17/2014   Migraines     Murmur, cardiac, unspecified     Osteoarthritis     Peripheral neuropathy      Pernicious anemia     Plantar fasciitis     PVD (peripheral vascular disease) (CMS-HCC)     S/P foot surgery 03/2012    Dr. Elvina Mattes   Seasonal allergic rhinitis             Past Surgical History:  Procedure Laterality Date   HYSTERECTOMY   1972   [place]   S/P cervical discectomy   1992   Right shoulder surgery   1992   Bilateral foot surgery   1999   CHOLECYSTECTOMY   2004   COLONOSCOPY   04/18/2005    Adenomatous Polyps   COLONOSCOPY   04/03/2009    Adenomatous Polyps: CBF 03/2014; recall ltr mailed 01/31/2014 (dw)   COLONOSCOPY   05/05/2014    Adenomatous Polyps: CBF 04/2017; Recall Ltr mailed 03/31/2017 (dh); See msg 04/03/2017 will postpone d/t blood clots in legs (dh)   KNEE ARTHROSCOPY Left 10/01/2017   ARTHROSCOPY KNEE W/MENISCUS REPAIR Left 10/01/2017   COLONOSCOPY   04/19/2018    Adenomatous Polyps: CBF 04/2021   EGD   04/19/2018  No repeat per RTE   EGD   11/17/2002,01/23/2003,04/18/2005,04/03/2009    No repeat per RTE (dw)                OB History     Gravida  2   Para  2   Term      Preterm      AB      Living         SAB      IAB      Ectopic      Molar      Multiple      Live Births           Obstetric Comments  Age at first period 28 Age of first pregnancy 60             Social History           Socioeconomic History   Marital status: Married  Tobacco Use   Smoking status: Former      Packs/day: 0.50      Years: 3.00      Pack years: 1.50      Types: Cigarettes      Quit date: 03/10/1978      Years since quitting: 43.1   Smokeless tobacco: Never   Tobacco comments:      quit greater than 30 years ago  Vaping Use   Vaping Use: Never used  Substance and Sexual Activity   Alcohol use: No      Alcohol/week: 0.0 standard drinks   Drug use: No   Sexual activity: Defer             Allergies  Allergen Reactions   Aspirin Unknown and Other (See Comments)       Bleeding ulcers in the stomach    Iodinated Contrast Media Hives and Nausea And Vomiting   Latex Hives and Rash   Nsaids (Non-Steroidal Anti-Inflammatory Drug) Unknown and Other (See Comments)   Other Hives      Contrast dye   Metrizamide Nausea And Vomiting      Current Medications        Current Outpatient Medications  Medication Sig Dispense Refill   acetaminophen (TYLENOL) 500 mg capsule Take 1,000 mg by mouth 2 (two) times daily.       calcium carbonate-vitamin D3 (OS-CAL 500+D) 500 mg(1,250mg ) -200 unit tablet Take 1 tablet by mouth once daily         docusate (COLACE) 50 MG capsule Take by mouth 2 (two) times daily.        lansoprazole (PREVACID) 30 MG DR capsule TAKE 1 CAPSULE BY MOUTH  TWICE DAILY 180 capsule 3   nortriptyline (PAMELOR) 75 MG capsule Take 1 capsule (75 mg total) by mouth at bedtime 90 capsule 2   potassium 99 mg Tab Take by mouth once daily       sucralfate (CARAFATE) 1 gram tablet Take 1 tablet (1 g total) by mouth once daily 30 tablet 1   topiramate (TOPAMAX) 100 MG tablet Take 1 tablet (100 mg total) by mouth 2 (two) times daily 180 tablet 3   vitamin E 400 UNIT capsule Take 400 Units by mouth once daily        No current facility-administered medications for this visit.             Family History  Problem Relation Age of Onset   Diabetes type II Mother     Heart disease Mother  Stroke Mother     High blood pressure (Hypertension) Father     Hip fracture Father     Prostate cancer Father     Diabetes type II Other     Lung cancer Maternal Aunt     Breast cancer Maternal Aunt     Colon cancer Cousin     Breast cancer Cousin          Labs and Radiology:    April 19, 2018 colonoscopy completed by Gaylyn Cheers, MD:   C. COLON POLYPS X3, RECTOSIGMOID; BIOPSY:  - TUBULAR ADENOMA, SESSILE SERRATED POLYP WITH REACTIVE LYMPHOID  AGGREGATE AND A FRAGMENT OF BENIGN COLONIC MUCOSA.  - NEGATIVE FOR HIGH-GRADE DYSPLASIA AND MALIGNANCY.     EGD T of the same date completed for epigastric pain:   Gastritis.  Normal esophagus.   DIAGNOSIS:  A. STOMACH, PROXIMAL ANTRUM; BIOPSY:  - NEGATIVE FOR ACTIVE MUCOSAL INFLAMMATION, H. PYLORI, INTESTINAL  METAPLASIA AND DYSPLASIA.   B. STOMACH, BODY; BIOPSY:  - NEGATIVE FOR ACTIVE MUCOSAL INFLAMMATION, H. PYLORI, INTESTINAL  METAPLASIA AND DYSPLASIA.     Review of Systems  Constitutional: Negative for chills and fever.  Respiratory: Negative for cough.          Objective:   Physical Exam Exam conducted with a chaperone present.  Constitutional:      Appearance: Normal appearance.  Cardiovascular:     Rate and Rhythm: Normal rate and regular rhythm.     Pulses: Normal pulses.     Heart sounds: Normal heart sounds.  Pulmonary:     Effort: Pulmonary effort is normal.     Breath sounds: Normal breath sounds.  Abdominal:     General: Bowel sounds are normal.     Palpations: Abdomen is soft.     Tenderness: There is abdominal tenderness in the left upper quadrant.     Hernia: No hernia is present.       Comments: No particular dietary pattern for abdominal discomfort or bloating.  Musculoskeletal:     Cervical back: Neck supple.  Skin:    General: Skin is warm and dry.  Neurological:     Mental Status: She is alert and oriented to person, place, and time.  Psychiatric:        Mood and Affect: Mood normal.        Behavior: Behavior normal.           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   Possible esophageal spasm rather than reflux, although she does obtain relief with antacids after about 15 minutes.  No biopsies completed at the time of her 2020 EGD.    Plan:     Indication for upper and lower endoscopy reviewed.  Risks of the procedure discussed.   In light of her report of intermittent dark stool with blood mixed in with the bowel movements, repeat colonoscopy at this time is important.  Likely minor diverticular  source rather than malignancy.    This note is partially prepared by Ledell Noss, CMA acting as a scribe in the presence of Dr. Hervey Ard, MD.    The documentation recorded by the scribe accurately reflects the service I personally performed and the decisions made by me.    Robert Bellow, MD FACS

## 2021-04-30 ENCOUNTER — Encounter: Payer: Self-pay | Admitting: General Surgery

## 2021-05-01 ENCOUNTER — Ambulatory Visit: Payer: Medicare Other

## 2021-05-01 ENCOUNTER — Encounter
Admission: RE | Disposition: A | Discharge status: Home / Self Care | Payer: Self-pay | Source: Home / Self Care | Attending: General Surgery

## 2021-05-01 ENCOUNTER — Other Ambulatory Visit: Payer: Self-pay | Admitting: *Deleted

## 2021-05-01 ENCOUNTER — Ambulatory Visit
Admission: RE | Admit: 2021-05-01 | Discharge: 2021-05-01 | Disposition: A | Discharge status: Home / Self Care | Payer: Medicare Other | Attending: General Surgery | Admitting: General Surgery

## 2021-05-01 DIAGNOSIS — D123 Benign neoplasm of transverse colon: Secondary | ICD-10-CM | POA: Insufficient documentation

## 2021-05-01 DIAGNOSIS — D7589 Other specified diseases of blood and blood-forming organs: Secondary | ICD-10-CM

## 2021-05-01 DIAGNOSIS — K635 Polyp of colon: Secondary | ICD-10-CM | POA: Diagnosis not present

## 2021-05-01 DIAGNOSIS — K317 Polyp of stomach and duodenum: Secondary | ICD-10-CM | POA: Diagnosis not present

## 2021-05-01 DIAGNOSIS — Z87891 Personal history of nicotine dependence: Secondary | ICD-10-CM | POA: Insufficient documentation

## 2021-05-01 DIAGNOSIS — K2950 Unspecified chronic gastritis without bleeding: Secondary | ICD-10-CM | POA: Insufficient documentation

## 2021-05-01 DIAGNOSIS — F32A Depression, unspecified: Secondary | ICD-10-CM | POA: Diagnosis not present

## 2021-05-01 DIAGNOSIS — Z09 Encounter for follow-up examination after completed treatment for conditions other than malignant neoplasm: Secondary | ICD-10-CM | POA: Diagnosis present

## 2021-05-01 DIAGNOSIS — F419 Anxiety disorder, unspecified: Secondary | ICD-10-CM | POA: Insufficient documentation

## 2021-05-01 DIAGNOSIS — R519 Headache, unspecified: Secondary | ICD-10-CM | POA: Insufficient documentation

## 2021-05-01 DIAGNOSIS — J449 Chronic obstructive pulmonary disease, unspecified: Secondary | ICD-10-CM | POA: Insufficient documentation

## 2021-05-01 DIAGNOSIS — K2100 Gastro-esophageal reflux disease with esophagitis, without bleeding: Secondary | ICD-10-CM | POA: Diagnosis not present

## 2021-05-01 DIAGNOSIS — K5730 Diverticulosis of large intestine without perforation or abscess without bleeding: Secondary | ICD-10-CM | POA: Insufficient documentation

## 2021-05-01 HISTORY — DX: Essential (primary) hypertension: I10

## 2021-05-01 HISTORY — DX: Personal history of colonic polyps: Z86.010

## 2021-05-01 HISTORY — DX: Personal history of colon polyps, unspecified: Z86.0100

## 2021-05-01 HISTORY — PX: COLONOSCOPY WITH PROPOFOL: SHX5780

## 2021-05-01 HISTORY — DX: Irritable bowel syndrome, unspecified: K58.9

## 2021-05-01 HISTORY — DX: Personal history of peptic ulcer disease: Z87.11

## 2021-05-01 HISTORY — PX: ESOPHAGOGASTRODUODENOSCOPY (EGD) WITH PROPOFOL: SHX5813

## 2021-05-01 SURGERY — ESOPHAGOGASTRODUODENOSCOPY (EGD) WITH PROPOFOL
Anesthesia: General

## 2021-05-01 MED ORDER — GLYCOPYRROLATE 0.2 MG/ML IJ SOLN
INTRAMUSCULAR | Status: DC | PRN
Start: 2021-05-01 — End: 2021-05-01
  Administered 2021-05-01 (×2): .1 mg via INTRAVENOUS

## 2021-05-01 MED ORDER — PROPOFOL 500 MG/50ML IV EMUL
INTRAVENOUS | Status: DC | PRN
  Administered 2021-05-01: 150 ug/kg/min via INTRAVENOUS

## 2021-05-01 MED ORDER — SODIUM CHLORIDE 0.9 % IV SOLN
INTRAVENOUS | Status: DC
  Administered 2021-05-01: 20 mL/h via INTRAVENOUS

## 2021-05-01 MED ORDER — PROPOFOL 10 MG/ML IV BOLUS
INTRAVENOUS | Status: DC | PRN
  Administered 2021-05-01: 40 mg via INTRAVENOUS

## 2021-05-01 MED ORDER — LIDOCAINE HCL (CARDIAC) PF 100 MG/5ML IV SOSY
PREFILLED_SYRINGE | INTRAVENOUS | Status: DC | PRN
  Administered 2021-05-01: 40 mg via INTRAVENOUS

## 2021-05-01 MED ORDER — PHENYLEPHRINE HCL (PRESSORS) 10 MG/ML IV SOLN
INTRAVENOUS | Status: DC | PRN
  Administered 2021-05-01 (×4): 80 ug via INTRAVENOUS
  Administered 2021-05-01: 40 ug via INTRAVENOUS

## 2021-05-01 NOTE — Anesthesia Preprocedure Evaluation (Signed)
Anesthesia Evaluation  Patient identified by MRN, date of birth, ID band Patient awake    Reviewed: Allergy & Precautions, H&P , NPO status , Patient's Chart, lab work & pertinent test results, reviewed documented beta blocker date and time   History of Anesthesia Complications (+) PONV and history of anesthetic complications  Airway Mallampati: II   Neck ROM: full    Dental  (+) Poor Dentition   Pulmonary COPD, former smoker,    Pulmonary exam normal        Cardiovascular Exercise Tolerance: Good hypertension, Normal cardiovascular exam+ Valvular Problems/Murmurs  Rhythm:regular Rate:Normal     Neuro/Psych  Headaches, Anxiety Depression  Neuromuscular disease negative psych ROS   GI/Hepatic Neg liver ROS, GERD  Medicated,  Endo/Other  negative endocrine ROS  Renal/GU negative Renal ROS  negative genitourinary   Musculoskeletal   Abdominal   Peds  Hematology negative hematology ROS (+)   Anesthesia Other Findings Past Medical History: No date: Arthritis 12/28/2019: Basal cell carcinoma     Comment:  R upper buttock No date: Complication of anesthesia No date: COPD (chronic obstructive pulmonary disease) (HCC)     Comment:  patient denies 04/01/2017: DVT (deep venous thrombosis) (HCC)     Comment:  left lower leg No date: GERD (gastroesophageal reflux disease) No date: Headache No date: Heart murmur No date: History of bleeding ulcers No date: History of colon polyps No date: Hypertension No date: IBS (irritable bowel syndrome) No date: Peripheral vascular disease (HCC)     Comment:  phlebitis No date: Phlebitis No date: PONV (postoperative nausea and vomiting) 03/2019: Skin cancer     Comment:  Right wrist BCC/SCC? Past Surgical History: No date: ABDOMINAL HYSTERECTOMY No date: ANKLE SURGERY No date: APPENDECTOMY No date: BACK SURGERY 02/13/2016: CATARACT EXTRACTION W/PHACO; Left     Comment:   Procedure: CATARACT EXTRACTION PHACO AND INTRAOCULAR               LENS PLACEMENT ([facility]);  Surgeon: Leandrew Koyanagi, MD;               Location: [place];  Service: Ophthalmology;                Laterality: Left;  TORIC 02/27/2016: CATARACT EXTRACTION W/PHACO; Right     Comment:  Procedure: CATARACT EXTRACTION PHACO AND INTRAOCULAR               LENS PLACEMENT ([facility]);  Surgeon: Leandrew Koyanagi, MD;               Location: [place];  Service: Ophthalmology;                Laterality: Right;  TORIC Latex sensitivity 1992: CERVICAL FUSION No date: CHOLECYSTECTOMY No date: COLONOSCOPY 04/19/2018: COLONOSCOPY WITH PROPOFOL; N/A     Comment:  Procedure: COLONOSCOPY WITH PROPOFOL;  Surgeon: Manya Silvas, MD;  Location: [facility] ENDOSCOPY;  Service:               Endoscopy;  Laterality: N/A; No date: ESOPHAGOGASTRODUODENOSCOPY 04/19/2018: ESOPHAGOGASTRODUODENOSCOPY (EGD) WITH PROPOFOL; N/A     Comment:  Procedure: ESOPHAGOGASTRODUODENOSCOPY (EGD) WITH               PROPOFOL;  Surgeon: Manya Silvas, MD;  Location:               [facility] ENDOSCOPY;  Service: Endoscopy;  Laterality: N/A; 2000: feet surgery No date:  FOOT SURGERY 10/01/2017: KNEE ARTHROSCOPY WITH MENISCAL REPAIR; Left     Comment:  Procedure: KNEE ARTHROSCOPY PARTIAL AND LATERAL               MENISCECTOMY AND CHONDROPLASTY;  Surgeon: Leim Fabry,               MD;  Location: [place];  Service:               Orthopedics;  Laterality: Left; 1977: right ankle surgery No date: SHOULDER ARTHROSCOPY BMI    Body Mass Index: 27.44 kg/m     Reproductive/Obstetrics negative OB ROS                             Anesthesia Physical Anesthesia Plan  ASA: 3  Anesthesia Plan: General   Post-op Pain Management:    Induction:   PONV Risk Score and Plan:   Airway Management Planned:   Additional Equipment:   Intra-op Plan:   Post-operative Plan:    Informed Consent: I have reviewed the patients History and Physical, chart, labs and discussed the procedure including the risks, benefits and alternatives for the proposed anesthesia with the patient or authorized representative who has indicated his/her understanding and acceptance.     Dental Advisory Given  Plan Discussed with: CRNA  Anesthesia Plan Comments:         Anesthesia Quick Evaluation

## 2021-05-01 NOTE — Op Note (Signed)
[facility] Gastroenterology Patient Name: Jeanette Harris Procedure Date: 05/01/2021 7:57 AM MRN: [id number] Account #: [id number] Date of Birth: October 24, 1945 Admit Type: Outpatient Age: 76 Room: [facility] ENDO ROOM 1 Gender: Female Note Status: Finalized Instrument Name: Upper Endoscope [id number] Procedure:             Upper GI endoscopy Indications:           Epigastric abdominal pain Providers:             Robert Bellow, MD Referring MD:          Precious Bard, MD (Referring MD) Medicines:             Propofol per Anesthesia Complications:         No immediate complications. Procedure:             Pre-Anesthesia Assessment:                        - Prior to the procedure, a History and Physical was                         performed, and patient medications, allergies and                         sensitivities were reviewed. The patient's tolerance                         of previous anesthesia was reviewed.                        - The risks and benefits of the procedure and the                         sedation options and risks were discussed with the                         patient. All questions were answered and informed                         consent was obtained.                        After obtaining informed consent, the endoscope was                         passed under direct vision. Throughout the procedure,                         the patient's blood pressure, pulse, and oxygen                         saturations were monitored continuously. The Endoscope                         was introduced through the mouth, and advanced to the                         second part of duodenum. The upper GI endoscopy was  accomplished without difficulty. The patient tolerated                         the procedure well. Findings:      LA Grade A (one or more mucosal breaks less than 5 mm, not extending       between tops of 2 mucosal  folds) esophagitis with no bleeding was found       35 cm from the incisors. Biopsies were taken with a cold forceps for       histology.      Multiple 5 mm sessile polyps with no bleeding and no stigmata of recent       bleeding were found in the gastric fundus.      Patchy mild inflammation characterized by erythema was found in the       prepyloric region of the stomach.      The examined duodenum was normal. Impression:            - LA Grade A reflux esophagitis with no bleeding.                         Biopsied.                        - Multiple gastric polyps.                        - Chronic gastritis.                        - Normal examined duodenum. Recommendation:        - Telephone endoscopist for pathology results in 1                         week. Procedure Code(s):     --- Professional ---                        [id number], Esophagogastroduodenoscopy, flexible,                         transoral; with biopsy, single or multiple Diagnosis Code(s):     --- Professional ---                        K21.00, Gastro-esophageal reflux disease with                         esophagitis, without bleeding                        K31.7, Polyp of stomach and duodenum                        K29.50, Unspecified chronic gastritis without bleeding                        R10.13, Epigastric pain CPT copyright 2019 American [employer]. All rights reserved. The codes documented in this report are preliminary and upon coder review may  be revised to meet current compliance requirements. Robert Bellow, MD 05/01/2021 8:16:54 AM This report has been signed electronically. Number of Addenda: 0 Note Initiated On: 05/01/2021 7:57 AM Estimated Blood  Loss:  Estimated blood loss was minimal.      [facility]

## 2021-05-01 NOTE — H&P (Signed)
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 History compatible with esophageal spasm. Tolerated prep well.    Medications Prior to Admission  Medication Sig Dispense Refill Last Dose   acetaminophen (TYLENOL) 325 MG tablet Take 1,000 mg by mouth in the morning and at bedtime.   04/30/2021   calcium-vitamin D (OSCAL WITH D) 500-200 MG-UNIT tablet Take 1 tablet by mouth.   04/30/2021   docusate sodium (COLACE) 50 MG capsule Take 50 mg by mouth 2 (two) times daily.   04/30/2021   lansoprazole (PREVACID) 30 MG capsule Take 30 mg by mouth daily at 12 noon.   04/30/2021   nortriptyline (PAMELOR) 75 MG capsule Take 75 mg by mouth at bedtime.   04/30/2021   Potassium 95 MG TABS Take 1 tablet by mouth daily.   04/30/2021   sucralfate (CARAFATE) 1 g tablet Take 1 g by mouth 4 (four) times daily -  with meals and at bedtime.   04/30/2021   topiramate (TOPAMAX) 100 MG tablet Take 100 mg by mouth 2 (two) times daily.   04/30/2021   vitamin E 180 MG (400 UNITS) capsule Take by mouth.   04/30/2021   Allergies  Allergen Reactions   Aspirin Other (See Comments)    Bleeding ulcers in the stomach   Ivp Dye [Iodinated Contrast Media] Nausea And Vomiting   Metrizamide Nausea And Vomiting   Nsaids Other (See Comments)    Unknown   Latex Rash   Past Medical History:  Diagnosis Date   Arthritis    Basal cell carcinoma 12/28/2019   R upper buttock   Complication of anesthesia    COPD (chronic obstructive pulmonary disease) ([place])    patient denies   DVT (deep venous thrombosis) ([place]) 04/01/2017   left lower leg   GERD (gastroesophageal reflux disease)    Headache    Heart murmur    History of bleeding ulcers    History of colon polyps    Hypertension    IBS (irritable bowel syndrome)    Peripheral vascular disease (H[facility])    phlebitis   Phlebitis    PONV (postoperative nausea and vomiting)    Skin cancer 03/2019   Right wrist BCC/SCC?    Past Surgical History:  Procedure Laterality Date   ABDOMINAL HYSTERECTOMY     ANKLE SURGERY     APPENDECTOMY     BACK SURGERY     CATARACT EXTRACTION W/PHACO Left 02/13/2016   Procedure: CATARACT EXTRACTION PHACO AND INTRAOCULAR LENS PLACEMENT ([place]);  Surgeon: Leandrew Koyanagi, MD;  Location: [place];  Service: Ophthalmology;  Laterality: Left;  TORIC   CATARACT EXTRACTION W/PHACO Right 02/27/2016   Procedure: CATARACT EXTRACTION PHACO AND INTRAOCULAR LENS PLACEMENT (IOC);  Surgeon: Leandrew Koyanagi, MD;  Location: [place];  Service: Ophthalmology;  Laterality: Right;  TORIC Latex sensitivity   CERVICAL FUSION  1992   CHOLECYSTECTOMY     COLONOSCOPY     COLONOSCOPY WITH PROPOFOL N/A 04/19/2018   Procedure: COLONOSCOPY WITH PROPOFOL;  Surgeon: Manya Silvas, MD;  Location[facility] ENDOSCOPY;  Service: Endoscopy;  Laterality: N/A;   ESOPHAGOGASTRODUODENOSCOPY     ESOPHAGOGASTRODUODENOSCOPY (EGD) WITH PROPOFOL N/A 04/19/2018   Procedure: ESOPHAGOGASTRODUODENOSCOPY (EGD) WITH PROPOFOL;  Surgeon: Manya Silvas, MD;  Location: [facility] ENDOSCOPY;  Service: Endoscopy;  Laterality: N/A;   feet surgery  2000   FOOT SURGERY     KNEE ARTHROSCOPY WITH MENISCAL REPAIR Left 10/01/2017  Procedure: KNEE ARTHROSCOPY PARTIAL AND LATERAL MENISCECTOMY AND CHONDROPLASTY;  Surgeon: Leim Fabry, MD;  Location: [place];  Service: Orthopedics;  Laterality: Left;   right ankle surgery  1977   SHOULDER ARTHROSCOPY     Social History   Socioeconomic History   Marital status: Married    Spouse name: Not on file   Number of children: Not on file   Years of education: Not on file   Highest education level: Not on file  Occupational History   Not on file  Tobacco Use   Smoking status: Former    Packs/day: 0.50    Years: 3.00    Pack years: 1.50    Types: Cigarettes    Quit date: 49    Years since quitting: 43.1   Smokeless tobacco: Never   Tobacco  comments:    only smoked for about 3 years   Vaping Use   Vaping Use: Never used  Substance and Sexual Activity   Alcohol use: No   Drug use: No   Sexual activity: Not on file  Other Topics Concern   Not on file  Social History Narrative   Not on file   Social Determinants of Health   Financial Resource Strain: Not on file  Food Insecurity: Not on file  Transportation Needs: Not on file  Physical Activity: Not on file  Stress: Not on file  Social Connections: Not on file  Intimate Partner Violence: Not on file   Social History   Social History Narrative   Not on file     ROS: Negative.     PE: HEENT: Negative. Lungs: Clear. Cardio: RR.   Assessment/Plan:  Proceed with planned upper and endoscopy.    Forest Gleason [facility] 05/01/2021

## 2021-05-01 NOTE — Anesthesia Postprocedure Evaluation (Signed)
Anesthesia Post Note  Patient: Jeanette Harris  Procedure(s) Performed: ESOPHAGOGASTRODUODENOSCOPY (EGD) WITH PROPOFOL COLONOSCOPY WITH PROPOFOL  Patient location during evaluation: PACU Anesthesia Type: General Level of consciousness: awake and alert Pain management: pain level controlled Vital Signs Assessment: post-procedure vital signs reviewed and stable Respiratory status: spontaneous breathing, nonlabored ventilation, respiratory function stable and patient connected to nasal cannula oxygen Cardiovascular status: blood pressure returned to baseline and stable Postop Assessment: no apparent nausea or vomiting Anesthetic complications: no   No notable events documented.   Last Vitals:  Vitals:   05/01/21 0850 05/01/21 0900  BP: (!) 110/99 (!) 104/49  Pulse: 83 79  Resp: 20 14  Temp:    SpO2: 99% 100%    Last Pain:  Vitals:   05/01/21 0900  TempSrc:   PainSc: 5                  Molli Barrows

## 2021-05-01 NOTE — Transfer of Care (Signed)
Immediate Anesthesia Transfer of Care Note  Patient: Jeanette Harris  Procedure(s) Performed: ESOPHAGOGASTRODUODENOSCOPY (EGD) WITH PROPOFOL COLONOSCOPY WITH PROPOFOL  Patient Location: PACU and Endoscopy Unit  Anesthesia Type:General  Level of Consciousness: drowsy  Airway & Oxygen Therapy: Patient Spontanous Breathing  Post-op Assessment: Report given to RN and Post -op Vital signs reviewed and stable  Post vital signs: Reviewed and stable  Last Vitals:  Vitals Value Taken Time  BP 90/40 05/01/21 0840  Temp    Pulse 82 05/01/21 0840  Resp 21 05/01/21 0840  SpO2 100 % 05/01/21 0840  Vitals shown include unvalidated device data.  Last Pain:  Vitals:   05/01/21 0840  TempSrc:   PainSc: Asleep         Complications: No notable events documented.

## 2021-05-01 NOTE — Op Note (Signed)
[facility] Gastroenterology Patient Name: Jeanette Harris Procedure Date: 05/01/2021 7:54 AM MRN: [id number] Account #: [id number] Date of Birth: [birth year] Admit Type: Outpatient Age: 76 Room: [facility] ENDO ROOM 1 Gender: Female Note Status: Finalized Instrument Name: Peds Colonoscope [id number] Procedure:             Colonoscopy Indications:           High risk colon cancer surveillance: Personal history                         of colonic polyps Providers:             Robert Bellow, MD Referring MD:          Precious Bard, MD (Referring MD) Medicines:             Propofol per Anesthesia Complications:         No immediate complications. Procedure:             Pre-Anesthesia Assessment:                        - Prior to the procedure, a History and Physical was                         performed, and patient medications, allergies and                         sensitivities were reviewed. The patient's tolerance                         of previous anesthesia was reviewed.                        - The risks and benefits of the procedure and the                         sedation options and risks were discussed with the                         patient. All questions were answered and informed                         consent was obtained.                        After obtaining informed consent, the colonoscope was                         passed under direct vision. Throughout the procedure,                         the patient's blood pressure, pulse, and oxygen                         saturations were monitored continuously. The                         Colonoscope was introduced through the anus and  advanced to the the terminal ileum. The colonoscopy                         was performed without difficulty. The patient                         tolerated the procedure well. The quality of the bowel                         preparation was  excellent. Findings:      Two sessile polyps were found in the distal transverse colon. The polyps       were 5 mm in size. These were biopsied with a cold forceps for histology.      A few small-mouthed diverticula were found in the sigmoid colon and       transverse colon.      The retroflexed view of the distal rectum and anal verge was normal and       showed no anal or rectal abnormalities. Impression:            - Two 5 mm polyps in the distal transverse colon.                         Biopsied.                        - Diverticulosis in the sigmoid colon and in the                         transverse colon.                        - The distal rectum and anal verge are normal on                         retroflexion view. Recommendation:        - Telephone endoscopist for pathology results in 1                         week. Procedure Code(s):     --- Professional ---                        [phone], Colonoscopy, flexible; with biopsy, single or                         multiple Diagnosis Code(s):     --- Professional ---                        Z86.010, Personal history of colonic polyps                        K63.5, Polyp of colon                        K57.30, Diverticulosis of large intestine without                         perforation or abscess without bleeding CPT copyright 2019 American [employer] Association. All rights reserved. The codes documented in this report are preliminary  and upon coder review may  be revised to meet current compliance requirements. Robert Bellow, MD 05/01/2021 8:37:36 AM This report has been signed electronically. Number of Addenda: 0 Note Initiated On: 05/01/2021 7:54 AM Scope Withdrawal Time: 0 hours 10 minutes 10 seconds  Total Procedure Duration: 0 hours 16 minutes 9 seconds  Estimated Blood Loss:  Estimated blood loss: none.      [facility]

## 2021-05-02 ENCOUNTER — Encounter: Payer: Self-pay | Admitting: General Surgery

## 2021-05-02 LAB — SURGICAL PATHOLOGY

## 2021-05-08 ENCOUNTER — Encounter: Payer: Self-pay | Admitting: Oncology

## 2021-05-08 ENCOUNTER — Other Ambulatory Visit: Payer: Self-pay

## 2021-05-08 ENCOUNTER — Inpatient Hospital Stay: Payer: Medicare Other | Attending: Oncology

## 2021-05-08 ENCOUNTER — Inpatient Hospital Stay: Payer: Medicare Other | Admitting: Oncology

## 2021-05-08 VITALS — BP 123/66 | HR 75 | Temp 97.3°F | Resp 18 | Wt 174.6 lb

## 2021-05-08 DIAGNOSIS — Z87891 Personal history of nicotine dependence: Secondary | ICD-10-CM | POA: Diagnosis not present

## 2021-05-08 DIAGNOSIS — D7589 Other specified diseases of blood and blood-forming organs: Secondary | ICD-10-CM | POA: Insufficient documentation

## 2021-05-08 DIAGNOSIS — Z803 Family history of malignant neoplasm of breast: Secondary | ICD-10-CM | POA: Insufficient documentation

## 2021-05-08 DIAGNOSIS — Z801 Family history of malignant neoplasm of trachea, bronchus and lung: Secondary | ICD-10-CM | POA: Insufficient documentation

## 2021-05-08 DIAGNOSIS — E538 Deficiency of other specified B group vitamins: Secondary | ICD-10-CM | POA: Diagnosis not present

## 2021-05-08 DIAGNOSIS — Z800 Family history of malignant neoplasm of digestive organs: Secondary | ICD-10-CM | POA: Diagnosis not present

## 2021-05-08 DIAGNOSIS — I10 Essential (primary) hypertension: Secondary | ICD-10-CM | POA: Diagnosis not present

## 2021-05-08 DIAGNOSIS — Z90710 Acquired absence of both cervix and uterus: Secondary | ICD-10-CM | POA: Insufficient documentation

## 2021-05-08 LAB — COMPREHENSIVE METABOLIC PANEL
ALT: 13 U/L (ref 0–44)
AST: 14 U/L — ABNORMAL LOW (ref 15–41)
Albumin: 4 g/dL (ref 3.5–5.0)
Alkaline Phosphatase: 55 U/L (ref 38–126)
Anion gap: 5 (ref 5–15)
BUN: 15 mg/dL (ref 8–23)
CO2: 26 mmol/L (ref 22–32)
Calcium: 9.2 mg/dL (ref 8.9–10.3)
Chloride: 106 mmol/L (ref 98–111)
Creatinine, Ser: 1.05 mg/dL — ABNORMAL HIGH (ref 0.44–1.00)
GFR, Estimated: 56 mL/min — ABNORMAL LOW (ref >60)
Glucose, Bld: 88 mg/dL (ref 70–99)
Potassium: 3.9 mmol/L (ref 3.5–5.1)
Sodium: 137 mmol/L (ref 135–145)
Total Bilirubin: 0.3 mg/dL (ref 0.3–1.2)
Total Protein: 7.2 g/dL (ref 6.5–8.1)

## 2021-05-08 LAB — CBC WITH DIFFERENTIAL/PLATELET
Abs Immature Granulocytes: 0.01 10*3/uL (ref 0.00–0.07)
Basophils Absolute: 0.1 10*3/uL (ref 0.0–0.1)
Basophils Relative: 1 %
Eosinophils Absolute: 0.2 10*3/uL (ref 0.0–0.5)
Eosinophils Relative: 4 %
HCT: 38.9 % (ref 36.0–46.0)
Hemoglobin: 12.7 g/dL (ref 12.0–15.0)
Immature Granulocytes: 0 %
Lymphocytes Relative: 34 %
Lymphs Abs: 1.4 10*3/uL (ref 0.7–4.0)
MCH: 33.8 pg (ref 26.0–34.0)
MCHC: 32.6 g/dL (ref 30.0–36.0)
MCV: 103.5 fL — ABNORMAL HIGH (ref 80.0–100.0)
Monocytes Absolute: 0.4 10*3/uL (ref 0.1–1.0)
Monocytes Relative: 10 %
Neutro Abs: 2.1 10*3/uL (ref 1.7–7.7)
Neutrophils Relative %: 51 %
Platelets: 215 10*3/uL (ref 150–400)
RBC: 3.76 MIL/uL — ABNORMAL LOW (ref 3.87–5.11)
RDW: 12.3 % (ref 11.5–15.5)
WBC: 4.1 10*3/uL (ref 4.0–10.5)
nRBC: 0 % (ref 0.0–0.2)

## 2021-05-08 LAB — TECHNOLOGIST SMEAR REVIEW: Plt Morphology: ADEQUATE

## 2021-05-08 NOTE — Progress Notes (Signed)
Hematology/Oncology Progress note [phone] [phone]      Patient Care Team: Marinda Elk, MD as PCP - General (Physician Assistant)  REFERRING PROVIDER: Marinda Elk, MD  CHIEF COMPLAINTS/REASON FOR VISIT:  Follow up for macrocytosis  HISTORY OF PRESENTING ILLNESS:   Jeanette Harris is a  76 y.o.  female with PMH listed below was seen in consultation at the request of  Marinda Elk, MD  for evaluation of macrocytosis  Reviewed patient's blood work done chronic hepatitis obvious. 02/07/2020, CBC showed hemoglobin 12.8, MCV 104.5, WBC 4.4, decreased lymphocyte, increased eosinophil percentage.  Reviewed past lab work.  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  Folate and vitamin B12 level have been both as stated in past and within normal limits. Patient was referred to hematology oncology for further evaluation.  Patient reports chronic lower extremity peripheral for 10 to 20 years.  She was previously seen by vascular surgeon and was told that " this is too deep and nothing can be done:.  He reports bilateral toes numbness tingling, cold sensation. Denies weight loss, fever, chills, fatigue, night sweats.  Patient was accompanied by her sister.  Patient has migraine and is on Topamax, chronically.  #Bilateral lower extremity bluish skin discoloration This is a chronic condition for her. Normal ANA, viscosity, negative cryoglobulin.  INTERVAL HISTORY Jeanette Harris is a 76 y.o. female who has above history reviewed by me today presents for follow up visit for management of macrocytosis February 2023 patient recently had EGD and colonoscopy by Dr. Bary Castilla.  Distal esophagus biopsy showed reflux.  Colon polyp x2, 1 polyp is tubular adenoma.  The other polyp is hyperplastic polyp negative for high-grade dysplasia and malignancy. Patient has no new complaints. 03/04/2021, patient had blood  work done at [place].  Vitamin B12 level is decreasing 250.  Review of Systems  Constitutional:  Negative for appetite change, chills, fatigue and fever.  HENT:   Negative for hearing loss and voice change.   Eyes:  Negative for eye problems.  Respiratory:  Negative for chest tightness and cough.   Cardiovascular:  Negative for chest pain.  Gastrointestinal:  Negative for abdominal distention, abdominal pain and blood in stool.  Endocrine: Negative for hot flashes.  Genitourinary:  Negative for difficulty urinating and frequency.   Musculoskeletal:  Negative for arthralgias.  Skin:  Negative for itching and rash.  Neurological:  Negative for extremity weakness.  Hematological:  Negative for adenopathy.  Psychiatric/Behavioral:  Negative for confusion.     MEDICAL HISTORY:  Past Medical History:  Diagnosis Date   Arthritis    Basal cell carcinoma 12/28/2019   R upper buttock   Complication of anesthesia    COPD (chronic obstructive pulmonary disease) ([place])    patient denies   DVT (deep venous thrombosis) ([place]) 04/01/2017   left lower leg   GERD (gastroesophageal reflux disease)    Headache    Heart murmur    History of bleeding ulcers    History of colon polyps    Hypertension    IBS (irritable bowel syndrome)    Peripheral vascular disease ([facility])    phlebitis   Phlebitis    PONV (postoperative nausea and vomiting)    Skin cancer 03/2019   Right wrist BCC/SCC?    SURGICAL HISTORY: Past Surgical History:  Procedure Laterality Date   ABDOMINAL HYSTERECTOMY     ANKLE SURGERY     APPENDECTOMY  BACK SURGERY     CATARACT EXTRACTION W/PHACO Left 02/13/2016   Procedure: CATARACT EXTRACTION PHACO AND INTRAOCULAR LENS PLACEMENT ([place]);  Surgeon: Leandrew Koyanagi, MD;  Location: [place];  Service: Ophthalmology;  Laterality: Left;  TORIC   CATARACT EXTRACTION W/PHACO Right 02/27/2016   Procedure: CATARACT EXTRACTION PHACO AND INTRAOCULAR LENS PLACEMENT  ([facility]);  Surgeon: Leandrew Koyanagi, MD;  Location: [place];  Service: Ophthalmology;  Laterality: Right;  TORIC Latex sensitivity   CERVICAL FUSION  1992   CHOLECYSTECTOMY     COLONOSCOPY     COLONOSCOPY WITH PROPOFOL N/A 04/19/2018   Procedure: COLONOSCOPY WITH PROPOFOL;  Surgeon: Manya Silvas, MD;  Location: [facility] ENDOSCOPY;  Service: Endoscopy;  Laterality: N/A;   COLONOSCOPY WITH PROPOFOL N/A 05/01/2021   Procedure: COLONOSCOPY WITH PROPOFOL;  Surgeon: Robert Bellow, MD;  Location: [facility] ENDOSCOPY;  Service: Endoscopy;  Laterality: N/A;   ESOPHAGOGASTRODUODENOSCOPY     ESOPHAGOGASTRODUODENOSCOPY (EGD) WITH PROPOFOL N/A 04/19/2018   Procedure: ESOPHAGOGASTRODUODENOSCOPY (EGD) WITH PROPOFOL;  Surgeon: Manya Silvas, MD;  Location: [facility] ENDOSCOPY;  Service: Endoscopy;  Laterality: N/A;   ESOPHAGOGASTRODUODENOSCOPY (EGD) WITH PROPOFOL N/A 05/01/2021   Procedure: ESOPHAGOGASTRODUODENOSCOPY (EGD) WITH PROPOFOL;  Surgeon: Robert Bellow, MD;  Location: [facility] ENDOSCOPY;  Service: Endoscopy;  Laterality: N/A;   feet surgery  2000   FOOT SURGERY     KNEE ARTHROSCOPY WITH MENISCAL REPAIR Left 10/01/2017   Procedure: KNEE ARTHROSCOPY PARTIAL AND LATERAL MENISCECTOMY AND CHONDROPLASTY;  Surgeon: Leim Fabry, MD;  Location: [place];  Service: Orthopedics;  Laterality: Left;   right ankle surgery  1977   SHOULDER ARTHROSCOPY      SOCIAL HISTORY: Social History   Socioeconomic History   Marital status: Married    Spouse name: Not on file   Number of children: Not on file   Years of education: Not on file   Highest education level: Not on file  Occupational History   Not on file  Tobacco Use   Smoking status: Former    Packs/day: 0.50    Years: 3.00    Pack years: 1.50    Types: Cigarettes    Quit date: 62    Years since quitting: 43.1   Smokeless tobacco: Never   Tobacco comments:    only smoked for about 3 years   Vaping Use   Vaping Use: Never  used  Substance and Sexual Activity   Alcohol use: No   Drug use: No   Sexual activity: Not on file  Other Topics Concern   Not on file  Social History Narrative   Not on file   Social Determinants of Health   Financial Resource Strain: Not on file  Food Insecurity: Not on file  Transportation Needs: Not on file  Physical Activity: Not on file  Stress: Not on file  Social Connections: Not on file  Intimate Partner Violence: Not on file    FAMILY HISTORY: Family History  Problem Relation Age of Onset   Diabetes Mother    Heart disease Mother    Prostate cancer Father    Hypertension Father    Breast cancer Maternal Aunt    Lung cancer Maternal Aunt    Breast cancer Cousin    Colon cancer Cousin    Breast cancer Cousin    Breast cancer Niece     ALLERGIES:  is allergic to aspirin, ivp dye [iodinated contrast media], metrizamide, nsaids, and latex.  MEDICATIONS:  Current Outpatient Medications  Medication Sig Dispense Refill  acetaminophen (TYLENOL) 325 MG tablet Take 1,000 mg by mouth in the morning and at bedtime.     calcium-vitamin D (OSCAL WITH D) 500-200 MG-UNIT tablet Take 1 tablet by mouth.     docusate sodium (COLACE) 50 MG capsule Take 50 mg by mouth 2 (two) times daily.     lansoprazole (PREVACID) 30 MG capsule Take 30 mg by mouth daily at 12 noon.     nortriptyline (PAMELOR) 75 MG capsule Take 75 mg by mouth at bedtime.     Potassium 95 MG TABS Take 1 tablet by mouth daily.     sucralfate (CARAFATE) 1 g tablet Take 1 g by mouth 4 (four) times daily -  with meals and at bedtime.     topiramate (TOPAMAX) 100 MG tablet Take 100 mg by mouth 2 (two) times daily.     vitamin E 180 MG (400 UNITS) capsule Take by mouth.     No current facility-administered medications for this visit.     PHYSICAL EXAMINATION: ECOG PERFORMANCE STATUS: 0 - Asymptomatic Vitals:   05/08/21 1304  BP: 123/66  Pulse: 75  Resp: 18  Temp: (!) 97.3 F (36.3 C)   Filed Weights    05/08/21 1304  Weight: 174 lb 9.6 oz (79.2 kg)   Physical Exam Constitutional:      General: She is not in acute distress.    Appearance: She is not diaphoretic.  HENT:     Head: Normocephalic and atraumatic.     Nose: Nose normal.     Mouth/Throat:     Pharynx: No oropharyngeal exudate.  Eyes:     General: No scleral icterus.    Pupils: Pupils are equal, round, and reactive to light.  Cardiovascular:     Rate and Rhythm: Normal rate and regular rhythm.     Heart sounds: No murmur heard. Pulmonary:     Effort: Pulmonary effort is normal. No respiratory distress.     Breath sounds: No rales.  Chest:     Chest wall: No tenderness.  Abdominal:     General: There is no distension.     Palpations: Abdomen is soft.     Tenderness: There is no abdominal tenderness.  Musculoskeletal:        General: Normal range of motion.     Cervical back: Normal range of motion and neck supple.  Skin:    General: Skin is warm and dry.     Findings: No erythema.  Neurological:     Mental Status: She is alert and oriented to person, place, and time.     Cranial Nerves: No cranial nerve deficit.     Motor: No abnormal muscle tone.     Coordination: Coordination normal.  Psychiatric:        Mood and Affect: Affect normal.       LABORATORY DATA:  I have reviewed the data as listed Lab Results  Component Value Date   WBC 4.1 05/08/2021   HGB 12.7 05/08/2021   HCT 38.9 05/08/2021   MCV 103.5 (H) 05/08/2021   PLT 215 05/08/2021   Recent Labs    03/27/21 1400 03/28/21 0437 05/08/21 1143  NA 135 137 137  K 3.8 4.0 3.9  CL 105 109 106  CO2 '24 24 26  ' GLUCOSE 96 103* 88  BUN '15 17 15  ' CREATININE 0.72 0.85 1.05*  CALCIUM 9.4 9.0 9.2  GFRNONAA >60 >60 56*  PROT  --   --  7.2  ALBUMIN  --   --  4.0  AST  --   --  14*  ALT  --   --  13  ALKPHOS  --   --  55  BILITOT  --   --  0.3    Iron/TIBC/Ferritin/ %Sat No results found for: IRON, TIBC, FERRITIN, IRONPCTSAT     RADIOGRAPHIC STUDIES: I have personally reviewed the radiological images as listed and agreed with the findings in the report. No results found.     ASSESSMENT & PLAN:  1. Macrocytosis   2. Vitamin B12 deficiency    #Macrocytosis, chronic, no anemia.  MCV is increasing. Labs reviewed and discussed with patient. Patient had extensive work-up previously including adequate B12, folate, normal LDH, SPEP showed negative M protein, normal light chain ratio.  03/04/2021, patient had vitamin B12 checked at [facility].  B12 level is low at 250.  This could attribute to the worsening of MCV.  I recommend patient to start taking vitamin B12 1000 mcg daily. Cannot rule out underlying bone marrow disorder, i.e. MDS Currently her counts are stable with no signs of cytopenia.  No constitutional symptoms. I recommend observation. Patient agrees with the plan.   Orders Placed This Encounter  Procedures   CBC with Differential/Platelet    Standing Status:   Future    Standing Expiration Date:   04/10/2022   Comprehensive metabolic panel    Standing Status:   Future    Standing Expiration Date:   04/10/2022   Multiple Myeloma Panel (SPEP&IFE w/QIG)    Standing Status:   Future    Standing Expiration Date:   04/10/2022   Vitamin B12    Standing Status:   Future    Standing Expiration Date:   04/10/2022   Folate    Standing Status:   Future    Standing Expiration Date:   04/10/2022   Kappa/lambda light chains    Standing Status:   Future    Standing Expiration Date:   04/10/2022    All questions were answered. The patient knows to call the clinic with any problems questions or concerns.  cc Marinda Elk, MD    Return of visit: 1 year  Earlie Server, MD, PhD Hematology Oncology  05/08/2021

## 2021-05-09 ENCOUNTER — Telehealth: Payer: Self-pay

## 2021-05-09 NOTE — Telephone Encounter (Signed)
-----   Message from Earlie Server, MD sent at 05/08/2021  9:17 PM EST ----- ?I reviewed her blood work done at [place].  B12 level is low.  Please let patient know that I recommend patient to take vitamin B12 OTC supply 1000 mcg daily if she has not been told to start yet.  The B12 deficiency may contribute to the increased size of red blood cell cells.  Keep same follow-up appointment. ? ?

## 2021-05-09 NOTE — Telephone Encounter (Signed)
Spoke with patient and informed her of her lab results from [place]. Per patient she was taken of Vitamin B12 because they thought this was contributing to her issue with red blood cells. Advised that Dr. Tasia Catchings recommends she restarts B12 1000 mcg daily and keep follow up appt to reevaluate. Patient verbalized understanding.  ?

## 2021-09-11 ENCOUNTER — Ambulatory Visit: Payer: Medicare Other | Admitting: Dermatology

## 2021-09-11 DIAGNOSIS — Z1283 Encounter for screening for malignant neoplasm of skin: Secondary | ICD-10-CM

## 2021-09-11 DIAGNOSIS — L578 Other skin changes due to chronic exposure to nonionizing radiation: Secondary | ICD-10-CM | POA: Diagnosis not present

## 2021-09-11 DIAGNOSIS — L821 Other seborrheic keratosis: Secondary | ICD-10-CM

## 2021-09-11 DIAGNOSIS — D229 Melanocytic nevi, unspecified: Secondary | ICD-10-CM

## 2021-09-11 DIAGNOSIS — C4492 Squamous cell carcinoma of skin, unspecified: Secondary | ICD-10-CM

## 2021-09-11 DIAGNOSIS — L814 Other melanin hyperpigmentation: Secondary | ICD-10-CM

## 2021-09-11 DIAGNOSIS — D485 Neoplasm of uncertain behavior of skin: Secondary | ICD-10-CM | POA: Diagnosis not present

## 2021-09-11 DIAGNOSIS — L300 Nummular dermatitis: Secondary | ICD-10-CM | POA: Diagnosis not present

## 2021-09-11 DIAGNOSIS — Z85828 Personal history of other malignant neoplasm of skin: Secondary | ICD-10-CM

## 2021-09-11 DIAGNOSIS — I8393 Asymptomatic varicose veins of bilateral lower extremities: Secondary | ICD-10-CM

## 2021-09-11 DIAGNOSIS — D1800 Hemangioma unspecified site: Secondary | ICD-10-CM

## 2021-09-11 HISTORY — DX: Squamous cell carcinoma of skin, unspecified: C44.92

## 2021-09-11 MED ORDER — CLOBETASOL PROPIONATE 0.05 % EX CREA: 1.0000 | TOPICAL_CREAM | Freq: Two times a day (BID) | CUTANEOUS | 1 refills | Status: DC

## 2021-09-11 NOTE — Progress Notes (Signed)
Follow-Up Visit   Subjective  Jeanette Harris is a 76 y.o. female who presents for the following: Annual Exam (Hx BCC - lesion on the mid back that is irritated). The patient presents for Total-Body Skin Exam (TBSE) for skin cancer screening and mole check.  The patient has spots, moles and lesions to be evaluated, some may be new or changing and the patient has concerns that these could be cancer.     The following portions of the chart were reviewed this encounter and updated as appropriate:       Review of Systems:  No other skin or systemic complaints except as noted in HPI or Assessment and Plan.  Objective  Well appearing patient in no apparent distress; mood and affect are within normal limits.  A full examination was performed including scalp, head, eyes, ears, nose, lips, neck, chest, axillae, abdomen, back, buttocks, bilateral upper extremities, bilateral lower extremities, hands, feet, fingers, toes, fingernails, and toenails. All findings within normal limits unless otherwise noted below.  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 30g 1RF. Topical steroids (such as triamcinolone, fluocinolone, fluocinonide, mometasone, clobetasol, halobetasol, betamethasone, hydrocortisone) can cause thinning and lightening of the skin if they are used for too long in the same area. Your physician has selected the right strength medicine for your problem and area affected on the body. Please use your medication only as directed by your physician to prevent side effects.    clobetasol cream (TEMOVATE) 0.05 % - L spinal mid upper back Apply 1 Application topically 2 (two) times daily. Apply to itchy rash on back QD-BID PRN. Avoid applying to face, groin, and axilla.  Use as directed. Long-term use can cause thinning of the skin.  Neoplasm of uncertain behavior of skin R upper arm  Skin / nail biopsy Type of biopsy: tangential   Informed consent: discussed and consent obtained   Anesthesia: the lesion was anesthetized in a standard fashion   Anesthesia comment:  Area prepped with alcohol Anesthetic:  1% lidocaine w/ epinephrine 1-100,000 buffered w/ 8.4% NaHCO3 Instrument used: flexible razor blade   Hemostasis achieved with: pressure and aluminum chloride   Outcome: patient tolerated procedure well   Post-procedure details: wound care instructions given   Post-procedure details comment:  Ointment and small bandage applied  Specimen 1 - Surgical pathology Differential Diagnosis: D48.5 irritated nevus r/o BCC Check Margins: No   Lentigines - Scattered tan macules - Due to sun exposure - Benign-appearing, observe - Recommend daily broad spectrum sunscreen SPF 30+ to sun-exposed areas, reapply every 2 hours as needed. - Call for any changes  Seborrheic Keratoses - Stuck-on, waxy, tan-brown papules and/or plaques  - Benign-appearing - Discussed benign etiology and prognosis. - Observe - Call for any changes  Melanocytic Nevi - Tan-brown and/or pink-flesh-colored symmetric macules and papules - Benign appearing on exam today - Observation - Call clinic for new or changing moles - Recommend daily use of broad spectrum spf 30+ sunscreen to sun-exposed areas.   Hemangiomas - Red papules - Discussed benign nature - Observe - Call for any changes  Actinic Damage - Chronic condition, secondary to cumulative UV/sun exposure - diffuse scaly erythematous macules with  underlying dyspigmentation - Recommend daily broad spectrum sunscreen SPF 30+ to sun-exposed areas, reapply every 2 hours as needed.  - Staying in the shade or wearing long sleeves, sun glasses (UVA+UVB protection) and wide brim hats (4-inch brim around the entire circumference  of the hat) are also recommended for sun protection.  - Call for new or changing lesions.  History of Basal Cell Carcinoma of the Skin - No evidence of recurrence today - Recommend regular full body skin exams - Recommend daily broad spectrum sunscreen SPF 30+ to sun-exposed areas, reapply every 2 hours as needed.  - Call if any new or changing lesions are noted between office visits  Varicose Veins/Spider Veins - Dilated blue, purple or red veins at the lower extremities/feet - Reassured - Smaller vessels can be treated by sclerotherapy (a procedure to inject a medicine into the veins to make them disappear) if desired, but the treatment is not covered by insurance. Larger vessels may be covered if symptomatic and we would refer to vascular surgeon if treatment desired.  Skin cancer screening performed today.  Return in about 1 year (around 09/12/2022) for TBSE.  Luther Redo, CMA, am acting as scribe for Brendolyn Patty, MD .  Documentation: I have reviewed the above documentation for accuracy and completeness, and I agree with the above.  Brendolyn Patty MD

## 2021-09-11 NOTE — Patient Instructions (Addendum)
Wound Care Instructions  Cleanse wound gently with soap and water once a day then pat dry with clean gauze. Apply a thing coat of Petrolatum (petroleum jelly, "Vaseline") over the wound (unless you have an allergy to this). We recommend that you use a new, sterile tube of Vaseline. Do not pick or remove scabs. Do not remove the yellow or white "healing tissue" from the base of the wound.  Cover the wound with fresh, clean, nonstick gauze and secure with paper tape. You may use Band-Aids in place of gauze and tape if the would is small enough, but would recommend trimming much of the tape off as there is often too much. Sometimes Band-Aids can irritate the skin.  You should call the office for your biopsy report after 1 week if you have not already been contacted.  If you experience any problems, such as abnormal amounts of bleeding, swelling, significant bruising, significant pain, or evidence of infection, please call the office immediately.  FOR ADULT SURGERY PATIENTS: If you need something for pain relief you may take 1 extra strength Tylenol (acetaminophen) AND 2 Ibuprofen (200mg each) together every 4 hours as needed for pain. (do not take these if you are allergic to them or if you have a reason you should not take them.) Typically, you may only need pain medication for 1 to 3 days.    Due to recent changes in healthcare laws, you may see results of your pathology and/or laboratory studies on MyChart before the doctors have had a chance to review them. We understand that in some cases there may be results that are confusing or concerning to you. Please understand that not all results are received at the same time and often the doctors may need to interpret multiple results in order to provide you with the best plan of care or course of treatment. Therefore, we ask that you please give us 2 business days to thoroughly review all your results before contacting the office for clarification. Should we  see a critical lab result, you will be contacted sooner.   If You Need Anything After Your Visit  If you have any questions or concerns for your doctor, please call our main line at [phone] and press option 4 to reach your doctor's medical assistant. If no one answers, please leave a voicemail as directed and we will return your call as soon as possible. Messages left after 4 pm will be answered the following business day.   You may also send us a message via MyChart. We typically respond to MyChart messages within 1-2 business days.  For prescription refills, please ask your pharmacy to contact our office. Our fax number is [phone].  If you have an urgent issue when the clinic is closed that cannot wait until the next business day, you can page your doctor at the number below.    Please note that while we do our best to be available for urgent issues outside of office hours, we are not available 24/7.   If you have an urgent issue and are unable to reach us, you may choose to seek medical care at your doctor's office, retail clinic, urgent care center, or emergency room.  If you have a medical emergency, please immediately call 911 or go to the emergency department.  Pager Numbers  - Dr. Kowalski: [phone]  - Dr. Moye: [phone]  - Dr. Stewart: [phone]  In the event of inclement weather, please call our main line at [phone]   for an update on the status of any delays or closures.  Dermatology Medication Tips: Please keep the boxes that topical medications come in in order to help keep track of the instructions about where and how to use these. Pharmacies typically print the medication instructions only on the boxes and not directly on the medication tubes.   If your medication is too expensive, please contact our office at [phone] option 4 or send us a message through MyChart.   We are unable to tell what your co-pay for medications will be in advance  as this is different depending on your insurance coverage. However, we may be able to find a substitute medication at lower cost or fill out paperwork to get insurance to cover a needed medication.   If a prior authorization is required to get your medication covered by your insurance company, please allow us 1-2 business days to complete this process.  Drug prices often vary depending on where the prescription is filled and some pharmacies may offer cheaper prices.  The website www.goodrx.com contains coupons for medications through different pharmacies. The prices here do not account for what the cost may be with help from insurance (it may be cheaper with your insurance), but the website can give you the price if you did not use any insurance.  - You can print the associated coupon and take it with your prescription to the pharmacy.  - You may also stop by our office during regular business hours and pick up a GoodRx coupon card.  - If you need your prescription sent electronically to a different pharmacy, notify our office through [place] MyChart or by phone at [phone] option 4.     Si Usted Necesita Algo Despus de Su Visita  Tambin puede enviarnos un mensaje a travs de MyChart. Por lo general respondemos a los mensajes de MyChart en el transcurso de 1 a 2 das hbiles.  Para renovar recetas, por favor pida a su farmacia que se ponga en contacto con nuestra oficina. Nuestro nmero de fax es el [phone].  Si tiene un asunto urgente cuando la clnica est cerrada y que no puede esperar hasta el siguiente da hbil, puede llamar/localizar a su doctor(a) al nmero que aparece a continuacin.   Por favor, tenga en cuenta que aunque hacemos todo lo posible para estar disponibles para asuntos urgentes fuera del horario de oficina, no estamos disponibles las 24 horas del da, los 7 das de la semana.   Si tiene un problema urgente y no puede comunicarse con nosotros, puede optar  por buscar atencin mdica  en el consultorio de su doctor(a), en una clnica privada, en un centro de atencin urgente o en una sala de emergencias.  Si tiene una emergencia mdica, por favor llame inmediatamente al 911 o vaya a la sala de emergencias.  Nmeros de bper  - Dr. Kowalski: [phone]  - Dra. Moye: [phone]  - Dra. Stewart: [phone]  En caso de inclemencias del tiempo, por favor llame a nuestra lnea principal al [phone] para una actualizacin sobre el estado de cualquier retraso o cierre.  Consejos para la medicacin en dermatologa: Por favor, guarde las cajas en las que vienen los medicamentos de uso tpico para ayudarle a seguir las instrucciones sobre dnde y cmo usarlos. Las farmacias generalmente imprimen las instrucciones del medicamento slo en las cajas y no directamente en los tubos del medicamento.   Si su medicamento es muy caro, por favor, pngase en contacto con nuestra   oficina llamando al [phone] y presione la opcin 4 o envenos un mensaje a travs de MyChart.   No podemos decirle cul ser su copago por los medicamentos por adelantado ya que esto es diferente dependiendo de la cobertura de su seguro. Sin embargo, es posible que podamos encontrar un medicamento sustituto a menor costo o llenar un formulario para que el seguro cubra el medicamento que se considera necesario.   Si se requiere una autorizacin previa para que su compaa de seguros cubra su medicamento, por favor permtanos de 1 a 2 das hbiles para completar este proceso.  Los precios de los medicamentos varan con frecuencia dependiendo del lugar de dnde se surte la receta y alguna farmacias pueden ofrecer precios ms baratos.  El sitio web www.goodrx.com tiene cupones para medicamentos de diferentes farmacias. Los precios aqu no tienen en cuenta lo que podra costar con la ayuda del seguro (puede ser ms barato con su seguro), pero el sitio web puede darle el precio si  no utiliz ningn seguro.  - Puede imprimir el cupn correspondiente y llevarlo con su receta a la farmacia.  - Tambin puede pasar por nuestra oficina durante el horario de atencin regular y recoger una tarjeta de cupones de GoodRx.  - Si necesita que su receta se enve electrnicamente a una farmacia diferente, informe a nuestra oficina a travs de MyChart de [place] o por telfono llamando al [phone] y presione la opcin 4.  

## 2021-09-12 ENCOUNTER — Telehealth: Payer: Self-pay

## 2021-09-12 NOTE — Telephone Encounter (Signed)
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

## 2021-09-12 NOTE — Telephone Encounter (Signed)
Advised pt of bx results and scheduled pt for EDC./sh 

## 2021-09-24 ENCOUNTER — Encounter: Payer: Medicare Other | Admitting: Dermatology

## 2021-10-08 ENCOUNTER — Ambulatory Visit: Payer: Medicare Other | Admitting: Dermatology

## 2021-10-08 ENCOUNTER — Encounter: Payer: Self-pay | Admitting: Dermatology

## 2021-10-08 DIAGNOSIS — W57XXXA Bitten or stung by nonvenomous insect and other nonvenomous arthropods, initial encounter: Secondary | ICD-10-CM | POA: Diagnosis not present

## 2021-10-08 DIAGNOSIS — S60861A Insect bite (nonvenomous) of right wrist, initial encounter: Secondary | ICD-10-CM | POA: Diagnosis not present

## 2021-10-08 DIAGNOSIS — D099 Carcinoma in situ, unspecified: Secondary | ICD-10-CM

## 2021-10-08 DIAGNOSIS — L578 Other skin changes due to chronic exposure to nonionizing radiation: Secondary | ICD-10-CM | POA: Diagnosis not present

## 2021-10-08 DIAGNOSIS — D0461 Carcinoma in situ of skin of right upper limb, including shoulder: Secondary | ICD-10-CM

## 2021-10-08 NOTE — Progress Notes (Signed)
   Follow-Up Visit   Subjective  Jeanette Harris is a 76 y.o. female who presents for the following: Procedure (Patient here today to treat bx proven SCCis at right upper arm. Patient also with a spot at right wrist, present for about 2 1/2 weeks, came up like a pimple and is now sore and itching. ).    The following portions of the chart were reviewed this encounter and updated as appropriate:       Review of Systems:  No other skin or systemic complaints except as noted in HPI or Assessment and Plan.  Objective  Well appearing patient in no apparent distress; mood and affect are within normal limits.  A focused examination was performed including right upper arm. Relevant physical exam findings are noted in the Assessment and Plan.  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  Destruction of lesion  Destruction method: electrodesiccation and curettage   Informed consent: discussed and consent obtained   Timeout:  patient name, date of birth, surgical site, and procedure verified Anesthesia: the lesion was anesthetized in a standard fashion   Anesthetic:  1% lidocaine w/ epinephrine 1-100,000 local infiltration Curettage performed in three different directions: Yes   Electrodesiccation performed over the curetted area: Yes   Final wound size (cm):  0.7 Hemostasis achieved with:  pressure, aluminum chloride and electrodesiccation Outcome: patient tolerated procedure well with no complications   Post-procedure details: wound care instructions given    Actinic Damage - chronic, secondary to cumulative UV radiation exposure/sun  exposure over time - diffuse scaly erythematous macules with underlying dyspigmentation - Recommend daily broad spectrum sunscreen SPF 30+ to sun-exposed areas, reapply every 2 hours as needed.  - Recommend staying in the shade or wearing long sleeves, sun glasses (UVA+UVB protection) and wide brim hats (4-inch brim around the entire circumference of the hat). - Call for new or changing lesions.   Return in about 6 months (around 04/10/2022), or recheck SCC.  Graciella Belton, RMA, am acting as scribe for Brendolyn Patty, MD .  Documentation: I have reviewed the above documentation for accuracy and completeness, and I agree with the above.  Brendolyn Patty MD

## 2021-10-08 NOTE — Patient Instructions (Signed)

## 2021-11-23 ENCOUNTER — Other Ambulatory Visit: Payer: Self-pay

## 2021-11-23 ENCOUNTER — Emergency Department
Admission: EM | Admit: 2021-11-23 | Discharge: 2021-11-23 | Disposition: A | Discharge status: Home / Self Care | Payer: Medicare Other | Attending: Emergency Medicine | Admitting: Emergency Medicine

## 2021-11-23 DIAGNOSIS — Z85828 Personal history of other malignant neoplasm of skin: Secondary | ICD-10-CM | POA: Diagnosis not present

## 2021-11-23 DIAGNOSIS — I10 Essential (primary) hypertension: Secondary | ICD-10-CM | POA: Insufficient documentation

## 2021-11-23 DIAGNOSIS — Z9049 Acquired absence of other specified parts of digestive tract: Secondary | ICD-10-CM | POA: Diagnosis not present

## 2021-11-23 DIAGNOSIS — R319 Hematuria, unspecified: Secondary | ICD-10-CM | POA: Diagnosis present

## 2021-11-23 DIAGNOSIS — N3001 Acute cystitis with hematuria: Secondary | ICD-10-CM | POA: Insufficient documentation

## 2021-11-23 DIAGNOSIS — J449 Chronic obstructive pulmonary disease, unspecified: Secondary | ICD-10-CM | POA: Insufficient documentation

## 2021-11-23 DIAGNOSIS — Z90710 Acquired absence of both cervix and uterus: Secondary | ICD-10-CM | POA: Diagnosis not present

## 2021-11-23 LAB — COMPREHENSIVE METABOLIC PANEL
ALT: 19 U/L (ref 0–44)
AST: 20 U/L (ref 15–41)
Albumin: 4.1 g/dL (ref 3.5–5.0)
Alkaline Phosphatase: 53 U/L (ref 38–126)
Anion gap: 6 (ref 5–15)
BUN: 16 mg/dL (ref 8–23)
CO2: 24 mmol/L (ref 22–32)
Calcium: 9.4 mg/dL (ref 8.9–10.3)
Chloride: 107 mmol/L (ref 98–111)
Creatinine, Ser: 1.04 mg/dL — ABNORMAL HIGH (ref 0.44–1.00)
GFR, Estimated: 56 mL/min — ABNORMAL LOW (ref >60)
Glucose, Bld: 113 mg/dL — ABNORMAL HIGH (ref 70–99)
Potassium: 3.8 mmol/L (ref 3.5–5.1)
Sodium: 137 mmol/L (ref 135–145)
Total Bilirubin: 0.7 mg/dL (ref 0.3–1.2)
Total Protein: 7 g/dL (ref 6.5–8.1)

## 2021-11-23 LAB — CBC WITH DIFFERENTIAL/PLATELET
Abs Immature Granulocytes: 0.04 10*3/uL (ref 0.00–0.07)
Basophils Absolute: 0 10*3/uL (ref 0.0–0.1)
Basophils Relative: 0 %
Eosinophils Absolute: 0.2 10*3/uL (ref 0.0–0.5)
Eosinophils Relative: 2 %
HCT: 37.9 % (ref 36.0–46.0)
Hemoglobin: 12.3 g/dL (ref 12.0–15.0)
Immature Granulocytes: 0 %
Lymphocytes Relative: 12 %
Lymphs Abs: 1.4 10*3/uL (ref 0.7–4.0)
MCH: 33 pg (ref 26.0–34.0)
MCHC: 32.5 g/dL (ref 30.0–36.0)
MCV: 101.6 fL — ABNORMAL HIGH (ref 80.0–100.0)
Monocytes Absolute: 0.8 10*3/uL (ref 0.1–1.0)
Monocytes Relative: 7 %
Neutro Abs: 8.7 10*3/uL — ABNORMAL HIGH (ref 1.7–7.7)
Neutrophils Relative %: 79 %
Platelets: 222 10*3/uL (ref 150–400)
RBC: 3.73 MIL/uL — ABNORMAL LOW (ref 3.87–5.11)
RDW: 12.2 % (ref 11.5–15.5)
WBC: 11.2 10*3/uL — ABNORMAL HIGH (ref 4.0–10.5)
nRBC: 0 % (ref 0.0–0.2)

## 2021-11-23 LAB — URINALYSIS, ROUTINE W REFLEX MICROSCOPIC
Bilirubin Urine: NEGATIVE
Glucose, UA: NEGATIVE mg/dL
Ketones, ur: NEGATIVE mg/dL
Nitrite: NEGATIVE
Protein, ur: 30 mg/dL — AB
Specific Gravity, Urine: 1.002 — ABNORMAL LOW (ref 1.005–1.030)
WBC, UA: >50 WBC/hpf — ABNORMAL HIGH (ref 0–5)
pH: 7 (ref 5.0–8.0)

## 2021-11-23 LAB — LACTIC ACID, PLASMA: Lactic Acid, Venous: 0.9 mmol/L (ref 0.5–1.9)

## 2021-11-23 MED ORDER — SODIUM CHLORIDE 0.9 % IV SOLN
1.0000 g | Freq: Once | INTRAVENOUS | Status: AC
  Administered 2021-11-23: 1 g via INTRAVENOUS
  Filled 2021-11-23: qty 1

## 2021-11-23 MED ORDER — SODIUM CHLORIDE 0.9 % IV SOLN: INTRAVENOUS | Status: DC

## 2021-11-23 MED ORDER — ACETAMINOPHEN 500 MG PO TABS
1000.0000 mg | ORAL_TABLET | Freq: Once | ORAL | Status: AC
  Administered 2021-11-23: 1000 mg via ORAL
  Filled 2021-11-23: qty 2

## 2021-11-23 MED ORDER — FENTANYL CITRATE PF 50 MCG/ML IJ SOSY
25.0000 ug | PREFILLED_SYRINGE | Freq: Once | INTRAMUSCULAR | Status: DC
  Filled 2021-11-23: qty 1

## 2021-11-23 MED ORDER — ONDANSETRON HCL 4 MG/2ML IJ SOLN
4.0000 mg | Freq: Once | INTRAMUSCULAR | Status: DC
  Filled 2021-11-23: qty 2

## 2021-11-23 MED ORDER — CEFDINIR 300 MG PO CAPS: 300.0000 mg | ORAL_CAPSULE | Freq: Two times a day (BID) | ORAL | 0 refills | Status: DC

## 2021-11-23 NOTE — ED Triage Notes (Addendum)
Pt presents to ER with increased urinary frequency that started yesterday afternoon.  Pt states she is having to urinate every 5 minutes and has been unable to get any sleep tonight.  Pt endorse some pressure around her bladder area but denies any pain, fever, or n/v/d.  Pt is A&O x4 at this time in NAD in triage.

## 2021-11-23 NOTE — ED Provider Notes (Signed)
[facility] Provider Note    Event Date/Time   First MD Initiated Contact with Patient 11/23/21 0241     (approximate)   History   Urinary Frequency   HPI  Jeanette Harris is a 76 y.o. 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  No dysuria.  No flank pain.  No vomiting.  States it has been years since her last UTI.  According to our records her last UTI was in 2020.  No history of kidney stones.   History provided by patient and daughter.    Past Medical History:  Diagnosis Date   Arthritis    Basal cell carcinoma 12/28/2019   R upper buttock   Complication of anesthesia    COPD (chronic obstructive pulmonary disease) ([place])    patient denies   DVT (deep venous thrombosis) ([place]) 04/01/2017   left lower leg   GERD (gastroesophageal reflux disease)    Headache    Heart murmur    History of bleeding ulcers    History of colon polyps    Hypertension    IBS (irritable bowel syndrome)    Peripheral vascular disease ([facility])    phlebitis   Phlebitis    PONV (postoperative nausea and vomiting)    Skin cancer 03/2019   Right wrist BCC/SCC?   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   SCC IS, R upper arm, [facility] 10/08/21    Past Surgical History:  Procedure Laterality Date   ABDOMINAL HYSTERECTOMY     ANKLE SURGERY     APPENDECTOMY     BACK SURGERY     CATARACT EXTRACTION W/PHACO Left 02/13/2016   Procedure: CATARACT EXTRACTION PHACO AND INTRAOCULAR LENS PLACEMENT ([place]);  Surgeon: Leandrew Koyanagi, MD;  Location: [place];  Service: Ophthalmology;  Laterality: Left;  TORIC   CATARACT EXTRACTION W/PHACO Right 02/27/2016   Procedure: CATARACT EXTRACTION PHACO AND INTRAOCULAR LENS PLACEMENT ([facility]);  Surgeon: Leandrew Koyanagi, MD;  Location: [place];  Service: Ophthalmology;  Laterality: Right;  TORIC Latex sensitivity   CERVICAL FUSION  1992   CHOLECYSTECTOMY     COLONOSCOPY     COLONOSCOPY WITH PROPOFOL N/A 04/19/2018   Procedure: COLONOSCOPY WITH PROPOFOL;  Surgeon: Manya Silvas, MD;  Location: [facility] ENDOSCOPY;  Service: Endoscopy;  Laterality: N/A;   COLONOSCOPY WITH PROPOFOL N/A 05/01/2021   Procedure: COLONOSCOPY WITH PROPOFOL;  Surgeon: Robert Bellow, MD;  Location: [facility] ENDOSCOPY;  Service: Endoscopy;  Laterality: N/A;   ESOPHAGOGASTRODUODENOSCOPY     ESOPHAGOGASTRODUODENOSCOPY (EGD) WITH PROPOFOL N/A 04/19/2018   Procedure: ESOPHAGOGASTRODUODENOSCOPY (EGD) WITH PROPOFOL;  Surgeon: Manya Silvas, MD;  Location: [facility] ENDOSCOPY;  Service: Endoscopy;  Laterality: N/A;   ESOPHAGOGASTRODUODENOSCOPY (EGD) WITH PROPOFOL N/A 05/01/2021   Procedure: ESOPHAGOGASTRODUODENOSCOPY (EGD) WITH PROPOFOL;  Surgeon: Robert Bellow, MD;  Location: [facility] ENDOSCOPY;  Service: Endoscopy;  Laterality: N/A;   feet surgery  2000   FOOT SURGERY     KNEE ARTHROSCOPY WITH MENISCAL REPAIR Left 10/01/2017   Procedure: KNEE ARTHROSCOPY PARTIAL AND LATERAL MENISCECTOMY AND CHONDROPLASTY;  Surgeon: Leim Fabry, MD;  Location: [place];  Service: Orthopedics;  Laterality: Left;   right ankle surgery  1977   SHOULDER ARTHROSCOPY      MEDICATIONS:  Prior to Admission medications   Medication Sig Start Date End Date Taking? Authorizing Provider  acetaminophen (TYLENOL)  325 MG tablet Take 1,000 mg by mouth in the morning and at bedtime.    [provider]  calcium-vitamin D (OSCAL WITH D) 500-200 MG-UNIT tablet Take 1 tablet by mouth.    [provider]  clobetasol cream (TEMOVATE) 8.29 % Apply 1 Application topically 2 (two) times daily. Apply to itchy rash on back QD-BID PRN. Avoid applying to face, groin, and axilla. Use as directed. Long-term use can cause thinning of the skin. 09/11/21   Brendolyn Patty, MD   docusate sodium (COLACE) 50 MG capsule Take 50 mg by mouth 2 (two) times daily.    [provider]  lansoprazole (PREVACID) 30 MG capsule Take 30 mg by mouth daily at 12 noon.    [provider]  nortriptyline (PAMELOR) 75 MG capsule Take 75 mg by mouth at bedtime.    [provider]  Potassium 95 MG TABS Take 1 tablet by mouth daily.    [provider]  sucralfate (CARAFATE) 1 g tablet Take 1 g by mouth 4 (four) times daily -  with meals and at bedtime.    [provider]  topiramate (TOPAMAX) 100 MG tablet Take 100 mg by mouth 2 (two) times daily.    [provider]  vitamin E 180 MG (400 UNITS) capsule Take by mouth.    [provider]    Physical Exam   Triage Vital Signs: ED Triage Vitals  Enc Vitals Group     BP 11/23/21 0130 (!) 142/66     Pulse Rate 11/23/21 0130 83     Resp 11/23/21 0130 18     Temp 11/23/21 0130 98.4 F (36.9 C)     Temp Source 11/23/21 0130 Oral     SpO2 11/23/21 0130 99 %     Weight 11/23/21 0131 170 lb (77.1 kg)     Height 11/23/21 0131 '5\' 6"'$  (1.676 m)     Head Circumference --      Peak Flow --      Pain Score 11/23/21 0131 0     Pain Loc --      Pain Edu? --      Excl. in [place]? --     Most recent vital signs: Vitals:   11/23/21 0315 11/23/21 0330  BP: 135/60 130/68  Pulse: 78 76  Resp:    Temp:    SpO2: 100% 100%    CONSTITUTIONAL: Alert and oriented and responds appropriately to questions. Well-appearing; well-nourished HEAD: Normocephalic, atraumatic EYES: Conjunctivae clear, pupils appear equal, sclera nonicteric ENT: normal nose; moist mucous membranes NECK: Supple, normal ROM CARD: RRR; S1 and S2 appreciated; no murmurs, no clicks, no rubs, no gallops RESP: Normal chest excursion without splinting or tachypnea; breath sounds clear and equal bilaterally; no wheezes, no rhonchi, no rales, no hypoxia or respiratory distress, speaking full sentences ABD/GI: Normal bowel  sounds; non-distended; soft, patient has some mild suprapubic tenderness, no rebound, no guarding, no peritoneal signs BACK: The back appears normal EXT: Normal ROM in all joints; no deformity noted, no edema; no cyanosis SKIN: Normal color for age and race; warm; no rash on exposed skin NEURO: Moves all extremities equally, normal speech PSYCH: The patient's mood and manner are appropriate.   ED Results / Procedures / Treatments   LABS: (all labs ordered are listed, but only abnormal results are displayed) Labs Reviewed  URINALYSIS, ROUTINE W REFLEX MICROSCOPIC - Abnormal; Notable for the following components:      Result Value   Color,  Urine YELLOW (*)    APPearance HAZY (*)    Specific Gravity, Urine 1.002 (*)    Hgb urine dipstick LARGE (*)    Protein, ur 30 (*)    Leukocytes,Ua LARGE (*)    WBC, UA >50 (*)    Bacteria, UA RARE (*)    All other components within normal limits  CBC WITH DIFFERENTIAL/PLATELET - Abnormal; Notable for the following components:   WBC 11.2 (*)    RBC 3.73 (*)    MCV 101.6 (*)    Neutro Abs 8.7 (*)    All other components within normal limits  COMPREHENSIVE METABOLIC PANEL - Abnormal; Notable for the following components:   Glucose, Bld 113 (*)    Creatinine, Ser 1.04 (*)    GFR, Estimated 56 (*)    All other components within normal limits  URINE CULTURE  LACTIC ACID, PLASMA     EKG:   RADIOLOGY: My personal review and interpretation of imaging:    I have personally reviewed all radiology reports.   No results found.   PROCEDURES:  Critical Care performed: No      Procedures    IMPRESSION / MDM / ASSESSMENT AND PLAN / ED COURSE  I reviewed the triage vital signs and the nursing notes.    Patient here with symptoms of UTI.    DIFFERENTIAL DIAGNOSIS (includes but not limited to):   UTI, doubt pyelonephritis, kidney stone, appendicitis   Patient's presentation is most consistent with acute presentation with  potential threat to life or bodily function.   PLAN: We will obtain CBC, CMP, lactic.  Urine does appear infected.  Culture pending.  We will give IV fluids, Rocephin, Tylenol.  Offered fentanyl which she declined.   MEDICATIONS GIVEN IN ED: Medications  0.9 %  sodium chloride infusion (0 mLs Intravenous Stopped 11/23/21 0405)  cefTRIAXone (ROCEPHIN) 1 g in sodium chloride 0.9 % 100 mL IVPB (0 g Intravenous Stopped 11/23/21 0353)  acetaminophen (TYLENOL) tablet 1,000 mg (1,000 mg Oral Given 11/23/21 0326)     ED COURSE: Patient's labs reassuring.  No leukocytosis.  Normal renal function.  Negative lactic.  I feel she is safe for discharge home on oral antibiotics.  She received a dose of Rocephin here.  Culture pending.  Discussed supportive care instructions and return precautions.  At this time, I do not feel there is any life-threatening condition present. I reviewed all nursing notes, vitals, pertinent previous records.  All lab and urine results, EKGs, imaging ordered have been independently reviewed and interpreted by myself.  I reviewed all available radiology reports from any imaging ordered this visit.  Based on my assessment, I feel the patient is safe to be discharged home without further emergent workup and can continue workup as an outpatient as needed. Discussed all findings, treatment plan as well as usual and customary return precautions.  They verbalize understanding and are comfortable with this plan.  Outpatient follow-up has been provided as needed.  All questions have been answered.  CONSULTS: Admission considered but patient is well-appearing without flank pain, vomiting with normal vitals and labs.  Appropriate for further outpatient management.   OUTSIDE RECORDS REVIEWED: Reviewed patient's last office visit with Paulita Cradle on 09/02/2021.       FINAL CLINICAL IMPRESSION(S) / ED DIAGNOSES   Final diagnoses:  Acute cystitis with hematuria     Rx / DC Orders    ED Discharge Orders          Ordered  cefdinir (OMNICEF) 300 MG capsule  2 times daily,   Status:  Discontinued        11/23/21 0352    cefdinir (OMNICEF) 300 MG capsule  2 times daily        11/23/21 0404             Note:  This document was prepared using Dragon voice recognition software and may include unintentional dictation errors.   Arhan Mcmanamon, Delice Bison, DO 11/23/21 [phone]

## 2021-11-23 NOTE — Discharge Instructions (Addendum)
You may alternate Tylenol 1000 mg every 6 hours as needed for pain, fever and Ibuprofen 600 mg every 8 hours as needed for pain, fever.  Please take Ibuprofen with food.  Do not take more than 4000 mg of Tylenol (acetaminophen) in a 24 hour period.   Your lab work was reassuring today.  You do have a urinary tract infection.  You have received a dose of IV antibiotics.  You may start your oral antibiotics in the morning.  I recommend increased water intake and you may use Azo over-the-counter if you have discomfort with urination.

## 2021-11-25 LAB — URINE CULTURE
Culture: >=100000 — AB
Special Requests: NORMAL

## 2021-12-06 NOTE — Progress Notes (Signed)
MRN : [id number]  Jeanette Harris is a 76 y.o. ([birth year]) female who presents with chief complaint of legs hurt and swell.  History of Present Illness:   The patient presents to the office for follow up regarding DVT.  DVT was identified in the left leg in 2019.  The initial symptoms were pain and swelling in the lower extremity.  The patient notes the leg continues to be painful and does still swell with prolonged dependency.  However, her symptoms are occurring with laying down and at night and even in the morning.  Symptoms are much better with elevation.  The patient notes minimal edema in the morning which steadily worsens throughout the day.    The patient has not been using compression therapy at this point.  No SOB or pleuritic chest pains.  No cough or hemoptysis.  No blood per rectum or blood in any sputum.  No excessive bruising per the patient.   No recent shortening of the patient's walking distance or new symptoms consistent with claudication.  No history of rest pain symptoms. No new ulcers or wounds of the lower extremities have occurred.  The patient denies amaurosis fugax or recent TIA symptoms. There are no recent neurological changes noted. No recent episodes of angina or shortness of breath documented.    No outpatient medications have been marked as taking for the 12/09/21 encounter (Appointment) with Delana Meyer, Dolores Lory, MD.    Past Medical History:  Diagnosis Date   Arthritis    Basal cell carcinoma 12/28/2019   R upper buttock   Complication of anesthesia    COPD (chronic obstructive pulmonary disease) ([place])    patient denies   DVT (deep venous thrombosis) ([place]) 04/01/2017   left lower leg   GERD (gastroesophageal reflux disease)    Headache    Heart murmur    History of bleeding ulcers    History of colon polyps    Hypertension    IBS (irritable bowel syndrome)    Peripheral vascular disease (HCC)    phlebitis   Phlebitis    PONV  (postoperative nausea and vomiting)    Skin cancer 03/2019   Right wrist BCC/SCC?   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   SCC IS, R upper arm, [facility] 10/08/21    Past Surgical History:  Procedure Laterality Date   ABDOMINAL HYSTERECTOMY     ANKLE SURGERY     APPENDECTOMY     BACK SURGERY     CATARACT EXTRACTION W/PHACO Left 02/13/2016   Procedure: CATARACT EXTRACTION PHACO AND INTRAOCULAR LENS PLACEMENT ([place]);  Surgeon: Leandrew Koyanagi, MD;  Location: [place];  Service: Ophthalmology;  Laterality: Left;  TORIC   CATARACT EXTRACTION W/PHACO Right 02/27/2016   Procedure: CATARACT EXTRACTION PHACO AND INTRAOCULAR LENS PLACEMENT (IOC);  Surgeon: Leandrew Koyanagi, MD;  Location: [place];  Service: Ophthalmology;  Laterality: Right;  TORIC Latex sensitivity   CERVICAL FUSION  1992   CHOLECYSTECTOMY     COLONOSCOPY     COLONOSCOPY WITH PROPOFOL N/A 04/19/2018   Procedure: COLONOSCOPY WITH PROPOFOL;  Surgeon: Manya Silvas, MD;  Location: [facility] ENDOSCOPY;  Service: Endoscopy;  Laterality: N/A;   COLONOSCOPY WITH PROPOFOL N/A 05/01/2021   Procedure: COLONOSCOPY WITH PROPOFOL;  Surgeon: Robert Bellow, MD;  Location: [facility] ENDOSCOPY;  Service: Endoscopy;  Laterality: N/A;   ESOPHAGOGASTRODUODENOSCOPY     ESOPHAGOGASTRODUODENOSCOPY (EGD) WITH PROPOFOL N/A 04/19/2018   Procedure: ESOPHAGOGASTRODUODENOSCOPY (EGD)  WITH PROPOFOL;  Surgeon: Manya Silvas, MD;  Location: [facility] ENDOSCOPY;  Service: Endoscopy;  Laterality: N/A;   ESOPHAGOGASTRODUODENOSCOPY (EGD) WITH PROPOFOL N/A 05/01/2021   Procedure: ESOPHAGOGASTRODUODENOSCOPY (EGD) WITH PROPOFOL;  Surgeon: Robert Bellow, MD;  Location: [facility] ENDOSCOPY;  Service: Endoscopy;  Laterality: N/A;   feet surgery  2000   FOOT SURGERY     KNEE ARTHROSCOPY WITH MENISCAL REPAIR Left 10/01/2017   Procedure: KNEE ARTHROSCOPY PARTIAL AND LATERAL MENISCECTOMY AND CHONDROPLASTY;  Surgeon: Leim Fabry, MD;  Location: [place];  Service: Orthopedics;  Laterality: Left;   right ankle surgery  1977   SHOULDER ARTHROSCOPY      Social History Social History   Tobacco Use   Smoking status: Former    Packs/day: 0.50    Years: 3.00    Total pack years: 1.50    Types: Cigarettes    Quit date: 1980    Years since quitting: 43.7   Smokeless tobacco: Never   Tobacco comments:    only smoked for about 3 years   Vaping Use   Vaping Use: Never used  Substance Use Topics   Alcohol use: No   Drug use: No    Family History Family History  Problem Relation Age of Onset   Diabetes Mother    Heart disease Mother    Prostate cancer Father    Hypertension Father    Breast cancer Maternal Aunt    Lung cancer Maternal Aunt    Breast cancer Cousin    Colon cancer Cousin    Breast cancer Cousin    Breast cancer Niece     Allergies  Allergen Reactions   Aspirin Other (See Comments)    Bleeding ulcers in the stomach   Ivp Dye [Iodinated Contrast Media] Nausea And Vomiting   Metrizamide Nausea And Vomiting   Nsaids Other (See Comments)    Unknown   Latex Rash     REVIEW OF SYSTEMS (Negative unless checked)  Constitutional: '[]'$ Weight loss  '[]'$ Fever  '[]'$ Chills Cardiac: '[]'$ Chest pain   '[]'$ Chest pressure   '[]'$ Palpitations   '[]'$ Shortness of breath when laying flat   '[]'$ Shortness of breath with exertion. Vascular:  '[]'$ Pain in legs with walking   '[x]'$ Pain in legs at rest  '[x]'$ History of DVT   '[]'$ Phlebitis   '[x]'$ Swelling in legs   '[]'$ Varicose veins   '[]'$ Non-healing ulcers Pulmonary:   '[]'$ Uses home oxygen   '[]'$ Productive cough   '[]'$ Hemoptysis   '[]'$ Wheeze  '[]'$ COPD   '[]'$ Asthma Neurologic:  '[]'$ Dizziness   '[]'$ Seizures   '[]'$ History of stroke   '[]'$ History of TIA  '[]'$ Aphasia   '[]'$ Vissual changes   '[]'$ Weakness or numbness in arm   '[]'$ Weakness or numbness in leg Musculoskeletal:   '[]'$ Joint swelling   '[]'$ Joint pain   '[x]'$ Low back pain Hematologic:  '[]'$ Easy bruising  '[]'$ Easy bleeding   '[]'$ Hypercoagulable state   '[]'$ Anemic Gastrointestinal:   '[]'$ Diarrhea   '[]'$ Vomiting  '[x]'$ Gastroesophageal reflux/heartburn   '[]'$ Difficulty swallowing. Genitourinary:  '[]'$ Chronic kidney disease   '[]'$ Difficult urination  '[]'$ Frequent urination   '[]'$ Blood in urine Skin:  '[]'$ Rashes   '[]'$ Ulcers  Psychological:  '[]'$ History of anxiety   '[]'$  History of major depression.  Physical Examination  There were no vitals filed for this visit. There is no height or weight on file to calculate BMI. Gen: WD/WN, NAD Head: [place]/AT, No temporalis wasting.  Ear/Nose/Throat: Hearing grossly intact, nares w/o erythema or drainage, pinna without lesions Eyes: PER, EOMI, sclera nonicteric.  Neck: Supple, no gross masses.  No JVD.  Pulmonary:  Good air movement, no audible wheezing, no use of accessory muscles.  Cardiac: RRR, precordium not hyperdynamic. Vascular:  scattered varicosities present bilaterally.  Moderate venous stasis changes to the legs bilaterally.  2+ soft pitting edema  Vessel Right Left  Radial Palpable Palpable  Gastrointestinal: soft, non-distended. No guarding/no peritoneal signs.  Musculoskeletal: M/S 5/5 throughout.  No deformity.  Neurologic: CN 2-12 intact. Pain and light touch intact in extremities.  Symmetrical.  Speech is fluent. Motor exam as listed above. Psychiatric: Judgment intact, Mood & affect appropriate for pt's clinical situation. Dermatologic: Venous rashes no ulcers noted.  No changes consistent with cellulitis. Lymph : No lichenification or skin changes of chronic lymphedema.  CBC Lab Results  Component Value Date   WBC 11.2 (H) 11/23/2021   HGB 12.3 11/23/2021   HCT 37.9 11/23/2021   MCV 101.6 (H) 11/23/2021   PLT 222 11/23/2021    BMET    Component Value Date/Time   NA 137 11/23/2021 0306   NA 141 04/07/2012 0214   K 3.8 11/23/2021 0306   K 4.3 04/07/2012 0214   CL 107 11/23/2021 0306   CL 106 04/07/2012 0214   CO2 24 11/23/2021 0306   CO2 25 04/07/2012 0214   GLUCOSE 113 (H) 11/23/2021 0306   GLUCOSE 137 (H) 04/07/2012 0214    BUN 16 11/23/2021 0306   BUN 16 04/07/2012 0214   CREATININE 1.04 (H) 11/23/2021 0306   CREATININE 0.95 04/07/2012 0214   CALCIUM 9.4 11/23/2021 0306   CALCIUM 9.2 04/07/2012 0214   GFRNONAA 56 (L) 11/23/2021 0306   GFRNONAA >60 04/07/2012 0214   GFRAA >60 02/12/2015 1341   GFRAA >60 04/07/2012 0214   Estimated Creatinine Clearance: 49.7 mL/min (A) (by C-G formula based on SCr of 1.04 mg/dL (H)).  COAG Lab Results  Component Value Date   INR 1.0 03/27/2021    Radiology No results found.   Assessment/Plan 1. Acute deep vein thrombosis (DVT) of proximal vein of left lower extremity (HCC) No surgery or intervention at this point in time.     I have reviewed my discussion with the patient regarding venous insufficiency and lymphedema and why it  causes symptoms.      In addition, behavioral modification throughout the day will be continued.  This will include frequent elevation (such as in a recliner), use of over the counter pain medications as needed and exercise such as walking.   I have reviewed systemic causes for chronic edema such as liver, kidney and cardiac etiologies and there does not appear to be any significant changes in these organ systems over the past year.  The patient is under the impression that these organ systems are all stable and unchanged.     The patient will continue aggressive use of the  lymph pump.  This will continue to improve the edema control and prevent sequela such as ulcers and infections.    The patient will follow-up with me on an annual basis.    2. Chronic venous insufficiency No surgery or intervention at this point in time.     I have reviewed my discussion with the patient regarding venous insufficiency and lymphedema and why it  causes symptoms.      In addition, behavioral modification throughout the day will be continued.  This will include frequent elevation (such as in a recliner), use of over the counter pain medications as  needed and exercise such as walking.   I have reviewed systemic causes for chronic edema  such as liver, kidney and cardiac etiologies and there does not appear to be any significant changes in these organ systems over the past year.  The patient is under the impression that these organ systems are all stable and unchanged.     The patient will continue aggressive use of the  lymph pump.  This will continue to improve the edema control and prevent sequela such as ulcers and infections.    The patient will follow-up with me on an annual basis.    3. DDD (degenerative disc disease), lumbosacral I will ask Dr Izora Ribas to evaluate for her increased leg pain in the face of good edema control.  - Ambulatory referral to Neurosurgery  4. PAD (peripheral artery disease) (HCC)  Recommend:  The patient has evidence of atherosclerosis of the lower extremities with claudication.  The patient does not voice lifestyle limiting changes at this point in time.  Noninvasive studies do not suggest clinically significant change.  No invasive studies, angiography or surgery at this time The patient should continue walking and begin a more formal exercise program.  The patient should continue antiplatelet therapy and aggressive treatment of the lipid abnormalities  No changes in the patient's medications at this time  Continued surveillance is indicated as atherosclerosis is likely to progress with time.    The patient will continue follow up with noninvasive studies as ordered.    5. Hyperlipemia, mixed Continue statin as ordered and reviewed, no changes at this time    Hortencia Pilar, MD  12/06/2021 5:41 PM

## 2021-12-09 ENCOUNTER — Ambulatory Visit (INDEPENDENT_AMBULATORY_CARE_PROVIDER_SITE_OTHER): Payer: Medicare Other | Admitting: Vascular Surgery

## 2021-12-09 ENCOUNTER — Encounter (INDEPENDENT_AMBULATORY_CARE_PROVIDER_SITE_OTHER): Payer: Self-pay | Admitting: Vascular Surgery

## 2021-12-09 VITALS — BP 122/71 | HR 73 | Resp 16 | Wt 177.8 lb

## 2021-12-09 DIAGNOSIS — I824Y2 Acute embolism and thrombosis of unspecified deep veins of left proximal lower extremity: Secondary | ICD-10-CM

## 2021-12-09 DIAGNOSIS — E782 Mixed hyperlipidemia: Secondary | ICD-10-CM

## 2021-12-09 DIAGNOSIS — I739 Peripheral vascular disease, unspecified: Secondary | ICD-10-CM

## 2021-12-09 DIAGNOSIS — M5137 Other intervertebral disc degeneration, lumbosacral region: Secondary | ICD-10-CM | POA: Diagnosis not present

## 2021-12-09 DIAGNOSIS — I872 Venous insufficiency (chronic) (peripheral): Secondary | ICD-10-CM

## 2021-12-15 ENCOUNTER — Encounter (INDEPENDENT_AMBULATORY_CARE_PROVIDER_SITE_OTHER): Payer: Self-pay | Admitting: Vascular Surgery

## 2021-12-30 ENCOUNTER — Ambulatory Visit (INDEPENDENT_AMBULATORY_CARE_PROVIDER_SITE_OTHER): Payer: Medicare Other | Admitting: Vascular Surgery

## 2022-01-08 ENCOUNTER — Other Ambulatory Visit: Payer: Self-pay | Admitting: Orthopedic Surgery

## 2022-01-08 DIAGNOSIS — M549 Dorsalgia, unspecified: Secondary | ICD-10-CM

## 2022-01-08 NOTE — Progress Notes (Unsigned)
Referring Physician:  Katha Cabal, MD [address] [place],  [place] [address]  Primary Physician:  Marinda Elk, MD  History of Present Illness: 01/08/2022 [name] has a histoyr of DVT left leg (2019), chronic venous insufficiency, PAD, COPD, bleeding ulcers, skin CA, and hyperlipidemia.   Referred here by vascular for evaluation of DDD and left leg pain despite good edema control.      Duration: *** Location: *** Quality: *** Severity: ***  Precipitating: aggravated by *** Modifying factors: made better by *** Weakness: none Timing: *** Bowel/Bladder Dysfunction: none  Conservative measures:  Physical therapy: ***  Multimodal medical therapy including regular antiinflammatories: tylenol  Injections: *** epidural steroid injections  Past Surgery: ***  Jacqui L Olexa has ***no symptoms of cervical myelopathy.  The symptoms are causing a significant impact on the patient's life.   Review of Systems:  A 10 point review of systems is negative, except for the pertinent positives and negatives detailed in the HPI.  Past Medical History: Past Medical History:  Diagnosis Date   Arthritis    Basal cell carcinoma 12/28/2019   R upper buttock   Complication of anesthesia    COPD (chronic obstructive pulmonary disease) ([place])    patient denies   DVT (deep venous thrombosis) ([place]) 04/01/2017   left lower leg   GERD (gastroesophageal reflux disease)    Headache    Heart murmur    History of bleeding ulcers    History of colon polyps    Hypertension    IBS (irritable bowel syndrome)    Peripheral vascular disease (HCC)    phlebitis   Phlebitis    PONV (postoperative nausea and vomiting)    Skin cancer 03/2019   Right wrist BCC/SCC?   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   SCC IS, R upper arm, [facility] 10/08/21    Past Surgical History: Past Surgical History:  Procedure Laterality Date   ABDOMINAL HYSTERECTOMY     ANKLE SURGERY      APPENDECTOMY     BACK SURGERY     CATARACT EXTRACTION W/PHACO Left 02/13/2016   Procedure: CATARACT EXTRACTION PHACO AND INTRAOCULAR LENS PLACEMENT ([place]);  Surgeon: Leandrew Koyanagi, MD;  Location: [place];  Service: Ophthalmology;  Laterality: Left;  TORIC   CATARACT EXTRACTION W/PHACO Right 02/27/2016   Procedure: CATARACT EXTRACTION PHACO AND INTRAOCULAR LENS PLACEMENT (IOC);  Surgeon: Leandrew Koyanagi, MD;  Location: [place];  Service: Ophthalmology;  Laterality: Right;  TORIC Latex sensitivity   CERVICAL FUSION  1992   CHOLECYSTECTOMY     COLONOSCOPY     COLONOSCOPY WITH PROPOFOL N/A 04/19/2018   Procedure: COLONOSCOPY WITH PROPOFOL;  Surgeon: Manya Silvas, MD;  Location: [facility] ENDOSCOPY;  Service: Endoscopy;  Laterality: N/A;   COLONOSCOPY WITH PROPOFOL N/A 05/01/2021   Procedure: COLONOSCOPY WITH PROPOFOL;  Surgeon: Robert Bellow, MD;  Location: [facility] ENDOSCOPY;  Service: Endoscopy;  Laterality: N/A;   ESOPHAGOGASTRODUODENOSCOPY     ESOPHAGOGASTRODUODENOSCOPY (EGD) WITH PROPOFOL N/A 04/19/2018   Procedure: ESOPHAGOGASTRODUODENOSCOPY (EGD) WITH PROPOFOL;  Surgeon: Manya Silvas, MD;  Location: [facility] ENDOSCOPY;  Service: Endoscopy;  Laterality: N/A;   ESOPHAGOGASTRODUODENOSCOPY (EGD) WITH PROPOFOL N/A 05/01/2021   Procedure: ESOPHAGOGASTRODUODENOSCOPY (EGD) WITH PROPOFOL;  Surgeon: Robert Bellow, MD;  Location: [facility] ENDOSCOPY;  Service: Endoscopy;  Laterality: N/A;   feet surgery  2000   FOOT SURGERY     KNEE ARTHROSCOPY WITH MENISCAL REPAIR Left 10/01/2017   Procedure: KNEE ARTHROSCOPY PARTIAL AND LATERAL  MENISCECTOMY AND CHONDROPLASTY;  Surgeon: Leim Fabry, MD;  Location: [place];  Service: Orthopedics;  Laterality: Left;   right ankle surgery  1977   SHOULDER ARTHROSCOPY      Allergies: Allergies as of 01/09/2022 - Review Complete 12/15/2021  Allergen Reaction Noted   Iodinated contrast media Nausea And Vomiting and Hives  02/12/2015   Aspirin Other (See Comments) 07/07/2015   Metrizamide Nausea And Vomiting 04/30/2021   Nsaids Other (See Comments) 07/07/2015   Latex Rash 02/12/2015    Medications: Outpatient Encounter Medications as of 01/09/2022  Medication Sig   acetaminophen (TYLENOL) 325 MG tablet Take 1,000 mg by mouth in the morning and at bedtime.   calcium-vitamin D (OSCAL WITH D) 500-200 MG-UNIT tablet Take 1 tablet by mouth.   cefdinir (OMNICEF) 300 MG capsule Take 1 capsule (300 mg total) by mouth 2 (two) times daily. (Patient not taking: Reported on 12/09/2021)   clobetasol cream (TEMOVATE) 9.41 % Apply 1 Application topically 2 (two) times daily. Apply to itchy rash on back QD-BID PRN. Avoid applying to face, groin, and axilla. Use as directed. Long-term use can cause thinning of the skin.   docusate sodium (COLACE) 50 MG capsule Take 50 mg by mouth 2 (two) times daily.   lansoprazole (PREVACID) 30 MG capsule Take 30 mg by mouth daily at 12 noon.   nortriptyline (PAMELOR) 75 MG capsule Take 75 mg by mouth at bedtime.   Potassium 95 MG TABS Take 1 tablet by mouth daily.   sucralfate (CARAFATE) 1 g tablet Take 1 g by mouth 4 (four) times daily -  with meals and at bedtime.   topiramate (TOPAMAX) 100 MG tablet Take 100 mg by mouth 2 (two) times daily.   vitamin E 180 MG (400 UNITS) capsule Take by mouth.   No facility-administered encounter medications on file as of 01/09/2022.    Social History: Social History   Tobacco Use   Smoking status: Former    Packs/day: 0.50    Years: 3.00    Total pack years: 1.50    Types: Cigarettes    Quit date: 1980    Years since quitting: 43.8   Smokeless tobacco: Never   Tobacco comments:    only smoked for about 3 years   Vaping Use   Vaping Use: Never used  Substance Use Topics   Alcohol use: No   Drug use: No    Family Medical History: Family History  Problem Relation Age of Onset   Diabetes Mother    Heart disease Mother    Prostate  cancer Father    Hypertension Father    Breast cancer Maternal Aunt    Lung cancer Maternal Aunt    Breast cancer Cousin    Colon cancer Cousin    Breast cancer Cousin    Breast cancer Niece     Physical Examination: There were no vitals filed for this visit.  General: Patient is well developed, well nourished, calm, collected, and in no apparent distress. Attention to examination is appropriate.  Respiratory: Patient is breathing without any difficulty.   NEUROLOGICAL:     Awake, alert, oriented to person, place, and time.  Speech is clear and fluent. Fund of knowledge is appropriate.   Cranial Nerves: Pupils equal round and reactive to light.  Facial tone is symmetric.  Facial sensation is symmetric.  ROM of spine:  *** ROM of cervical spine *** pain *** ROM of lumbar spine *** pain  No abnormal lesions on exposed  skin.   Strength: Side Biceps Triceps Deltoid Interossei Grip Wrist Ext. Wrist Flex.  R '5 5 5 5 5 5 5  '$ L '5 5 5 5 5 5 5   '$ Side Iliopsoas Quads Hamstring PF DF EHL  R '5 5 5 5 5 5  '$ L '5 5 5 5 5 5   '$ Reflexes are ***2+ and symmetric at the biceps, triceps, brachioradialis, patella and achilles.   Hoffman's is absent.  Clonus is not present.   Bilateral upper and lower extremity sensation is intact to light touch.    No evidence of dysmetria noted.  Gait is normal.   ***No difficulty with tandem gait.    Medical Decision Making  Imaging: Lumbar xrays dated ***:  ***  I have personally reviewed the images and agree with the above interpretation.  Assessment and Plan: Ms. Rezek is a pleasant 76 y.o. female with ***  Above treatment options discussed with patient and following plan made:    - History of bleeding ulcers. Care with NSAIDs.   - Order for physical therapy for *** spine ***. - Continue on current medications including ***. Reviewed proper dosing along with risks and benefits. Take and NSAIDs with food.      I spent a total of ***  minutes in face-to-face and non-face-to-face activities related to this patient's care toda including review of outside records, review of imaging, review of symptoms, physical exam, discussion of differential diagnosis, discussion of treatment options, and documentation.   Thank you for involving me in the care of this patient.   Geronimo Boot PA-C Dept. of Neurosurgery

## 2022-01-09 ENCOUNTER — Ambulatory Visit
Admission: RE | Admit: 2022-01-09 | Discharge: 2022-01-09 | Disposition: A | Discharge status: Home / Self Care | Payer: Medicare Other | Source: Ambulatory Visit | Attending: Orthopedic Surgery | Admitting: Orthopedic Surgery

## 2022-01-09 ENCOUNTER — Ambulatory Visit
Admission: RE | Admit: 2022-01-09 | Discharge: 2022-01-09 | Disposition: A | Discharge status: Home / Self Care | Payer: Medicare Other | Attending: Orthopedic Surgery | Admitting: Orthopedic Surgery

## 2022-01-09 ENCOUNTER — Encounter: Payer: Self-pay | Admitting: Orthopedic Surgery

## 2022-01-09 ENCOUNTER — Ambulatory Visit (INDEPENDENT_AMBULATORY_CARE_PROVIDER_SITE_OTHER): Payer: Medicare Other | Admitting: Orthopedic Surgery

## 2022-01-09 VITALS — BP 114/74 | Ht 66.0 in | Wt 176.0 lb

## 2022-01-09 DIAGNOSIS — M5136 Other intervertebral disc degeneration, lumbar region: Secondary | ICD-10-CM

## 2022-01-09 DIAGNOSIS — M47816 Spondylosis without myelopathy or radiculopathy, lumbar region: Secondary | ICD-10-CM | POA: Diagnosis not present

## 2022-01-09 DIAGNOSIS — M549 Dorsalgia, unspecified: Secondary | ICD-10-CM | POA: Insufficient documentation

## 2022-01-09 DIAGNOSIS — Z9889 Other specified postprocedural states: Secondary | ICD-10-CM | POA: Diagnosis not present

## 2022-01-14 ENCOUNTER — Ambulatory Visit
Admission: RE | Admit: 2022-01-14 | Discharge: 2022-01-14 | Disposition: A | Discharge status: Home / Self Care | Payer: Medicare Other | Source: Ambulatory Visit | Attending: Physician Assistant | Admitting: Physician Assistant

## 2022-01-14 ENCOUNTER — Other Ambulatory Visit: Payer: Self-pay | Admitting: Physician Assistant

## 2022-01-14 DIAGNOSIS — R1012 Left upper quadrant pain: Secondary | ICD-10-CM | POA: Insufficient documentation

## 2022-01-14 DIAGNOSIS — R079 Chest pain, unspecified: Secondary | ICD-10-CM | POA: Diagnosis present

## 2022-01-29 ENCOUNTER — Other Ambulatory Visit: Payer: Self-pay | Admitting: Gastroenterology

## 2022-01-29 DIAGNOSIS — R933 Abnormal findings on diagnostic imaging of other parts of digestive tract: Secondary | ICD-10-CM

## 2022-02-12 ENCOUNTER — Ambulatory Visit
Admission: RE | Admit: 2022-02-12 | Discharge: 2022-02-12 | Disposition: A | Discharge status: Home / Self Care | Payer: Medicare Other | Source: Ambulatory Visit | Attending: Gastroenterology | Admitting: Gastroenterology

## 2022-02-12 DIAGNOSIS — R933 Abnormal findings on diagnostic imaging of other parts of digestive tract: Secondary | ICD-10-CM

## 2022-02-12 MED ORDER — IOHEXOL 300 MG/ML  SOLN
100.0000 mL | Freq: Once | INTRAMUSCULAR | Status: AC | PRN
  Administered 2022-02-12: 100 mL via INTRAVENOUS

## 2022-02-24 ENCOUNTER — Other Ambulatory Visit: Payer: Self-pay | Admitting: Physician Assistant

## 2022-02-24 DIAGNOSIS — Z1231 Encounter for screening mammogram for malignant neoplasm of breast: Secondary | ICD-10-CM

## 2022-03-16 NOTE — Progress Notes (Deleted)
MRN : [id number]  Jeanette Harris is a 77 y.o. (27-Jan-1946) female who presents with chief complaint of legs hurt and swell.  History of Present Illness:   The patient presents to the office for follow up regarding DVT.  DVT was identified in the left leg in 2019.  The initial symptoms were pain and swelling in the lower extremity.   The patient notes the leg continues to be painful and does still swell with prolonged dependency.  However, her symptoms are occurring with laying down and at night and even in the morning.  Symptoms are much better with elevation.  The patient notes minimal edema in the morning which steadily worsens throughout the day.     The patient has not been using compression therapy at this point.   No SOB or pleuritic chest pains.  No cough or hemoptysis.   No blood per rectum or blood in any sputum.  No excessive bruising per the patient.    No recent shortening of the patient's walking distance or new symptoms consistent with claudication.  No history of rest pain symptoms. No new ulcers or wounds of the lower extremities have occurred.   The patient denies amaurosis fugax or recent TIA symptoms. There are no recent neurological changes noted. No recent episodes of angina or shortness of breath documented.    No outpatient medications have been marked as taking for the 03/17/22 encounter (Appointment) with Delana Meyer, Dolores Lory, MD.    Past Medical History:  Diagnosis Date   Arthritis    Basal cell carcinoma 12/28/2019   R upper buttock   Complication of anesthesia    COPD (chronic obstructive pulmonary disease) ([place])    patient denies   DVT (deep venous thrombosis) ([place]) 04/01/2017   left lower leg   GERD (gastroesophageal reflux disease)    Headache    Heart murmur    History of bleeding ulcers    History of colon polyps    Hypertension    IBS (irritable bowel syndrome)    Peripheral vascular disease (HCC)    phlebitis   Phlebitis    PONV  (postoperative nausea and vomiting)    Skin cancer 03/2019   Right wrist BCC/SCC?   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   SCC IS, R upper arm, [facility] 10/08/21    Past Surgical History:  Procedure Laterality Date   ABDOMINAL HYSTERECTOMY     ANKLE SURGERY Right [place] AND 1990   CATARACT EXTRACTION W/PHACO Left 02/13/2016   Procedure: CATARACT EXTRACTION PHACO AND INTRAOCULAR LENS PLACEMENT ([place]);  Surgeon: Leandrew Koyanagi, MD;  Location: [place];  Service: Ophthalmology;  Laterality: Left;  TORIC   CATARACT EXTRACTION W/PHACO Right 02/27/2016   Procedure: CATARACT EXTRACTION PHACO AND INTRAOCULAR LENS PLACEMENT (IOC);  Surgeon: Leandrew Koyanagi, MD;  Location: [place];  Service: Ophthalmology;  Laterality: Right;  TORIC Latex sensitivity   CERVICAL FUSION  1992   CHOLECYSTECTOMY  2004   COLONOSCOPY     COLONOSCOPY WITH PROPOFOL N/A 04/19/2018   Procedure: COLONOSCOPY WITH PROPOFOL;  Surgeon: Manya Silvas, MD;  Location: [facility] ENDOSCOPY;  Service: Endoscopy;  Laterality: N/A;   COLONOSCOPY WITH PROPOFOL N/A 05/01/2021   Procedure: COLONOSCOPY WITH PROPOFOL;  Surgeon: Robert Bellow, MD;  Location: [facility] ENDOSCOPY;  Service: Endoscopy;  Laterality: N/A;   ESOPHAGOGASTRODUODENOSCOPY  ESOPHAGOGASTRODUODENOSCOPY (EGD) WITH PROPOFOL N/A 04/19/2018   Procedure: ESOPHAGOGASTRODUODENOSCOPY (EGD) WITH PROPOFOL;  Surgeon: Manya Silvas, MD;  Location: [facility] ENDOSCOPY;  Service: Endoscopy;  Laterality: N/A;   ESOPHAGOGASTRODUODENOSCOPY (EGD) WITH PROPOFOL N/A 05/01/2021   Procedure: ESOPHAGOGASTRODUODENOSCOPY (EGD) WITH PROPOFOL;  Surgeon: Robert Bellow, MD;  Location: [facility] ENDOSCOPY;  Service: Endoscopy;  Laterality: N/A;   feet surgery  2000   FOOT SURGERY  2014   KNEE ARTHROSCOPY WITH MENISCAL REPAIR Left 10/01/2017   Procedure: KNEE ARTHROSCOPY PARTIAL AND LATERAL MENISCECTOMY AND CHONDROPLASTY;   Surgeon: Leim Fabry, MD;  Location: [place];  Service: Orthopedics;  Laterality: Left;   right ankle surgery  1977   SHOULDER ARTHROSCOPY      Social History Social History   Tobacco Use   Smoking status: Former    Packs/day: 0.50    Years: 3.00    Total pack years: 1.50    Types: Cigarettes    Quit date: 1980    Years since quitting: 44.0   Smokeless tobacco: Never   Tobacco comments:    only smoked for about 3 years   Vaping Use   Vaping Use: Never used  Substance Use Topics   Alcohol use: No   Drug use: No    Family History Family History  Problem Relation Age of Onset   Diabetes Mother    Heart disease Mother    Prostate cancer Father    Hypertension Father    Breast cancer Maternal Aunt    Lung cancer Maternal Aunt    Breast cancer Cousin    Colon cancer Cousin    Breast cancer Cousin    Breast cancer Niece     Allergies  Allergen Reactions   Iodinated Contrast Media Nausea And Vomiting and Hives   Aspirin Other (See Comments)    Bleeding ulcers in the stomach   Metrizamide Nausea And Vomiting   Nsaids Other (See Comments)    Unknown   Latex Rash     REVIEW OF SYSTEMS (Negative unless checked)  Constitutional: '[]'$ Weight loss  '[]'$ Fever  '[]'$ Chills Cardiac: '[]'$ Chest pain   '[]'$ Chest pressure   '[]'$ Palpitations   '[]'$ Shortness of breath when laying flat   '[]'$ Shortness of breath with exertion. Vascular:  '[]'$ Pain in legs with walking   '[x]'$ Pain in legs at rest  '[]'$ History of DVT   '[]'$ Phlebitis   '[x]'$ Swelling in legs   '[]'$ Varicose veins   '[]'$ Non-healing ulcers Pulmonary:   '[]'$ Uses home oxygen   '[]'$ Productive cough   '[]'$ Hemoptysis   '[]'$ Wheeze  '[]'$ COPD   '[]'$ Asthma Neurologic:  '[]'$ Dizziness   '[]'$ Seizures   '[]'$ History of stroke   '[]'$ History of TIA  '[]'$ Aphasia   '[]'$ Vissual changes   '[]'$ Weakness or numbness in arm   '[x]'$ Weakness or numbness in leg Musculoskeletal:   '[]'$ Joint swelling   '[x]'$ Joint pain   '[x]'$ Low back pain Hematologic:  '[]'$ Easy bruising  '[]'$ Easy bleeding    '[]'$ Hypercoagulable state   '[]'$ Anemic Gastrointestinal:  '[]'$ Diarrhea   '[]'$ Vomiting  '[x]'$ Gastroesophageal reflux/heartburn   '[]'$ Difficulty swallowing. Genitourinary:  '[]'$ Chronic kidney disease   '[]'$ Difficult urination  '[]'$ Frequent urination   '[]'$ Blood in urine Skin:  '[]'$ Rashes   '[]'$ Ulcers  Psychological:  '[]'$ History of anxiety   '[]'$  History of major depression.  Physical Examination  There were no vitals filed for this visit. There is no height or weight on file to calculate BMI. Gen: WD/WN, NAD Head: [place]/AT, No temporalis wasting.  Ear/Nose/Throat: Hearing grossly intact, nares w/o erythema or drainage, pinna without lesions Eyes: PER, EOMI, sclera nonicteric.  Neck: Supple, no gross masses.  No JVD.  Pulmonary:  Good air movement, no audible wheezing, no use of accessory muscles.  Cardiac: RRR, precordium not hyperdynamic. Vascular:  scattered varicosities present bilaterally.  Moderate venous stasis changes to the legs bilaterally.  2+ soft pitting edema  Vessel Right Left  Radial Palpable Palpable  Gastrointestinal: soft, non-distended. No guarding/no peritoneal signs.  Musculoskeletal: M/S 5/5 throughout.  No deformity.  Neurologic: CN 2-12 intact. Pain and light touch intact in extremities.  Symmetrical.  Speech is fluent. Motor exam as listed above. Psychiatric: Judgment intact, Mood & affect appropriate for pt's clinical situation. Dermatologic: Venous rashes no ulcers noted.  No changes consistent with cellulitis. Lymph : No lichenification or skin changes of chronic lymphedema.  CBC Lab Results  Component Value Date   WBC 11.2 (H) 11/23/2021   HGB 12.3 11/23/2021   HCT 37.9 11/23/2021   MCV 101.6 (H) 11/23/2021   PLT 222 11/23/2021    BMET    Component Value Date/Time   NA 137 11/23/2021 0306   NA 141 04/07/2012 0214   K 3.8 11/23/2021 0306   K 4.3 04/07/2012 0214   CL 107 11/23/2021 0306   CL 106 04/07/2012 0214   CO2 24 11/23/2021 0306   CO2 25 04/07/2012 0214   GLUCOSE 113  (H) 11/23/2021 0306   GLUCOSE 137 (H) 04/07/2012 0214   BUN 16 11/23/2021 0306   BUN 16 04/07/2012 0214   CREATININE 1.04 (H) 11/23/2021 0306   CREATININE 0.95 04/07/2012 0214   CALCIUM 9.4 11/23/2021 0306   CALCIUM 9.2 04/07/2012 0214   GFRNONAA 56 (L) 11/23/2021 0306   GFRNONAA >60 04/07/2012 0214   GFRAA >60 02/12/2015 1341   GFRAA >60 04/07/2012 0214   CrCl cannot be calculated (Patient's most recent lab result is older than the maximum 21 days allowed.).  COAG Lab Results  Component Value Date   INR 1.0 03/27/2021    Radiology No results found.   Assessment/Plan There are no diagnoses linked to this encounter.   Hortencia Pilar, MD  03/16/2022 12:21 PM

## 2022-03-17 ENCOUNTER — Ambulatory Visit (INDEPENDENT_AMBULATORY_CARE_PROVIDER_SITE_OTHER): Payer: Medicare Other | Admitting: Vascular Surgery

## 2022-03-17 DIAGNOSIS — I872 Venous insufficiency (chronic) (peripheral): Secondary | ICD-10-CM

## 2022-03-17 DIAGNOSIS — K219 Gastro-esophageal reflux disease without esophagitis: Secondary | ICD-10-CM

## 2022-03-17 DIAGNOSIS — M5137 Other intervertebral disc degeneration, lumbosacral region: Secondary | ICD-10-CM

## 2022-03-17 DIAGNOSIS — E782 Mixed hyperlipidemia: Secondary | ICD-10-CM

## 2022-03-30 NOTE — Progress Notes (Signed)
MRN : [id number]  Jeanette Harris is a 77 y.o. (1945/03/31) female who presents with chief complaint of legs hurt and swell.  History of Present Illness:   The patient presents to the office for follow up regarding DVT.  DVT was identified in the left leg in 2019.  The initial symptoms were pain and swelling in the lower extremity.   The patient notes the leg continues to be painful and does still swell with prolonged dependency.  However, her symptoms are occurring with laying down and at night and even in the morning.  Symptoms are much better with elevation.  The patient notes minimal edema in the morning which steadily worsens throughout the day.     The patient has not been using compression therapy at this point.   No SOB or pleuritic chest pains.  No cough or hemoptysis.   No blood per rectum or blood in any sputum.  No excessive bruising per the patient.    No recent shortening of the patient's walking distance or new symptoms consistent with claudication.  No history of rest pain symptoms. No new ulcers or wounds of the lower extremities have occurred.   The patient denies amaurosis fugax or recent TIA symptoms. There are no recent neurological changes noted.  No recent episodes of angina or shortness of breath documented.     No outpatient medications have been marked as taking for the 03/31/22 encounter (Appointment) with Delana Meyer, Dolores Lory, MD.    Past Medical History:  Diagnosis Date   Arthritis    Basal cell carcinoma 12/28/2019   R upper buttock   Complication of anesthesia    COPD (chronic obstructive pulmonary disease) ([place])    patient denies   DVT (deep venous thrombosis) ([place]) 04/01/2017   left lower leg   GERD (gastroesophageal reflux disease)    Headache    Heart murmur    History of bleeding ulcers    History of colon polyps    Hypertension    IBS (irritable bowel syndrome)    Peripheral vascular disease (HCC)    phlebitis   Phlebitis     PONV (postoperative nausea and vomiting)    Skin cancer 03/2019   Right wrist BCC/SCC?   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   SCC IS, R upper arm, [facility] 10/08/21    Past Surgical History:  Procedure Laterality Date   ABDOMINAL HYSTERECTOMY     ANKLE SURGERY Right [place] AND 1990   CATARACT EXTRACTION W/PHACO Left 02/13/2016   Procedure: CATARACT EXTRACTION PHACO AND INTRAOCULAR LENS PLACEMENT ([place]);  Surgeon: Leandrew Koyanagi, MD;  Location: [place];  Service: Ophthalmology;  Laterality: Left;  TORIC   CATARACT EXTRACTION W/PHACO Right 02/27/2016   Procedure: CATARACT EXTRACTION PHACO AND INTRAOCULAR LENS PLACEMENT (IOC);  Surgeon: Leandrew Koyanagi, MD;  Location: [place];  Service: Ophthalmology;  Laterality: Right;  TORIC Latex sensitivity   CERVICAL FUSION  1992   CHOLECYSTECTOMY  2004   COLONOSCOPY     COLONOSCOPY WITH PROPOFOL N/A 04/19/2018   Procedure: COLONOSCOPY WITH PROPOFOL;  Surgeon: Manya Silvas, MD;  Location: [facility] ENDOSCOPY;  Service: Endoscopy;  Laterality: N/A;   COLONOSCOPY WITH PROPOFOL N/A 05/01/2021   Procedure: COLONOSCOPY WITH PROPOFOL;  Surgeon: Robert Bellow, MD;  Location: [facility] ENDOSCOPY;  Service: Endoscopy;  Laterality: N/A;   ESOPHAGOGASTRODUODENOSCOPY  ESOPHAGOGASTRODUODENOSCOPY (EGD) WITH PROPOFOL N/A 04/19/2018   Procedure: ESOPHAGOGASTRODUODENOSCOPY (EGD) WITH PROPOFOL;  Surgeon: Manya Silvas, MD;  Location: [facility] ENDOSCOPY;  Service: Endoscopy;  Laterality: N/A;   ESOPHAGOGASTRODUODENOSCOPY (EGD) WITH PROPOFOL N/A 05/01/2021   Procedure: ESOPHAGOGASTRODUODENOSCOPY (EGD) WITH PROPOFOL;  Surgeon: Robert Bellow, MD;  Location: [facility] ENDOSCOPY;  Service: Endoscopy;  Laterality: N/A;   feet surgery  2000   FOOT SURGERY  2014   KNEE ARTHROSCOPY WITH MENISCAL REPAIR Left 10/01/2017   Procedure: KNEE ARTHROSCOPY PARTIAL AND LATERAL MENISCECTOMY AND  CHONDROPLASTY;  Surgeon: Leim Fabry, MD;  Location: [place];  Service: Orthopedics;  Laterality: Left;   right ankle surgery  1977   SHOULDER ARTHROSCOPY      Social History Social History   Tobacco Use   Smoking status: Former    Packs/day: 0.50    Years: 3.00    Total pack years: 1.50    Types: Cigarettes    Quit date: 1980    Years since quitting: 44.0   Smokeless tobacco: Never   Tobacco comments:    only smoked for about 3 years   Vaping Use   Vaping Use: Never used  Substance Use Topics   Alcohol use: No   Drug use: No    Family History Family History  Problem Relation Age of Onset   Diabetes Mother    Heart disease Mother    Prostate cancer Father    Hypertension Father    Breast cancer Maternal Aunt    Lung cancer Maternal Aunt    Breast cancer Cousin    Colon cancer Cousin    Breast cancer Cousin    Breast cancer Niece     Allergies  Allergen Reactions   Iodinated Contrast Media Nausea And Vomiting and Hives   Aspirin Other (See Comments)    Bleeding ulcers in the stomach   Metrizamide Nausea And Vomiting   Nsaids Other (See Comments)    Unknown   Latex Rash     REVIEW OF SYSTEMS (Negative unless checked)  Constitutional: '[]'$ Weight loss  '[]'$ Fever  '[]'$ Chills Cardiac: '[]'$ Chest pain   '[]'$ Chest pressure   '[]'$ Palpitations   '[]'$ Shortness of breath when laying flat   '[]'$ Shortness of breath with exertion. Vascular:  '[]'$ Pain in legs with walking   '[x]'$ Pain in legs at rest  '[x]'$ History of DVT   '[]'$ Phlebitis   '[x]'$ Swelling in legs   '[]'$ Varicose veins   '[]'$ Non-healing ulcers Pulmonary:   '[]'$ Uses home oxygen   '[]'$ Productive cough   '[]'$ Hemoptysis   '[]'$ Wheeze  '[]'$ COPD   '[]'$ Asthma Neurologic:  '[]'$ Dizziness   '[]'$ Seizures   '[]'$ History of stroke   '[]'$ History of TIA  '[]'$ Aphasia   '[]'$ Vissual changes   '[]'$ Weakness or numbness in arm   '[]'$ Weakness or numbness in leg Musculoskeletal:   '[]'$ Joint swelling   '[x]'$ Joint pain   '[x]'$ Low back pain Hematologic:  '[]'$ Easy bruising  '[]'$ Easy bleeding    '[]'$ Hypercoagulable state   '[]'$ Anemic Gastrointestinal:  '[]'$ Diarrhea   '[]'$ Vomiting  '[x]'$ Gastroesophageal reflux/heartburn   '[]'$ Difficulty swallowing. Genitourinary:  '[]'$ Chronic kidney disease   '[]'$ Difficult urination  '[]'$ Frequent urination   '[]'$ Blood in urine Skin:  '[]'$ Rashes   '[]'$ Ulcers  Psychological:  '[]'$ History of anxiety   '[]'$  History of major depression.  Physical Examination  There were no vitals filed for this visit. There is no height or weight on file to calculate BMI. Gen: WD/WN, NAD Head: [place]/AT, No temporalis wasting.  Ear/Nose/Throat: Hearing grossly intact, nares w/o erythema or drainage, pinna without lesions Eyes: PER, EOMI, sclera nonicteric.  Neck: Supple, no gross masses.  No JVD.  Pulmonary:  Good air movement, no audible wheezing, no use of accessory muscles.  Cardiac: RRR, precordium not hyperdynamic. Vascular:  scattered varicosities present bilaterally.  Moderate venous stasis changes to the legs bilaterally.  2+ soft pitting edema  Vessel Right Left  Radial Palpable Palpable  Gastrointestinal: soft, non-distended. No guarding/no peritoneal signs.  Musculoskeletal: M/S 5/5 throughout.  No deformity.  Neurologic: CN 2-12 intact. Pain and light touch intact in extremities.  Symmetrical.  Speech is fluent. Motor exam as listed above. Psychiatric: Judgment intact, Mood & affect appropriate for pt's clinical situation. Dermatologic: Venous rashes no ulcers noted.  No changes consistent with cellulitis. Lymph : No lichenification or skin changes of chronic lymphedema.  CBC Lab Results  Component Value Date   WBC 11.2 (H) 11/23/2021   HGB 12.3 11/23/2021   HCT 37.9 11/23/2021   MCV 101.6 (H) 11/23/2021   PLT 222 11/23/2021    BMET    Component Value Date/Time   NA 137 11/23/2021 0306   NA 141 04/07/2012 0214   K 3.8 11/23/2021 0306   K 4.3 04/07/2012 0214   CL 107 11/23/2021 0306   CL 106 04/07/2012 0214   CO2 24 11/23/2021 0306   CO2 25 04/07/2012 0214   GLUCOSE 113  (H) 11/23/2021 0306   GLUCOSE 137 (H) 04/07/2012 0214   BUN 16 11/23/2021 0306   BUN 16 04/07/2012 0214   CREATININE 1.04 (H) 11/23/2021 0306   CREATININE 0.95 04/07/2012 0214   CALCIUM 9.4 11/23/2021 0306   CALCIUM 9.2 04/07/2012 0214   GFRNONAA 56 (L) 11/23/2021 0306   GFRNONAA >60 04/07/2012 0214   GFRAA >60 02/12/2015 1341   GFRAA >60 04/07/2012 0214   CrCl cannot be calculated (Patient's most recent lab result is older than the maximum 21 days allowed.).  COAG Lab Results  Component Value Date   INR 1.0 03/27/2021    Radiology No results found.   Assessment/Plan 1. Chronic deep vein thrombosis (DVT) of proximal vein of lower extremity, unspecified laterality (HCC) Recommend:  No surgery or intervention at this point in time.    I have reviewed my discussion with the patient regarding lymphedema and why it  causes symptoms.  Patient will continue wearing graduated compression on a daily basis. The patient should put the compression on first thing in the morning and removing them in the evening. The patient should not sleep in the compression.   In addition, behavioral modification throughout the day will be continued.  This will include frequent elevation (such as in a recliner), use of over the counter pain medications as needed and exercise such as walking.  The systemic causes for chronic edema such as liver, kidney and cardiac etiologies does not appear to have significant changed over the past year.    The patient will continue aggressive use of the  lymph pump.  This will continue to improve the edema control and prevent sequela such as ulcers and infections.   The patient will follow-up with me on an annual basis.   2. Chronic venous insufficiency Recommend:  No surgery or intervention at this point in time.    I have reviewed my discussion with the patient regarding lymphedema and why it  causes symptoms.  Patient will continue wearing graduated compression  on a daily basis. The patient should put the compression on first thing in the morning and removing them in the evening. The patient should not sleep in the  compression.   In addition, behavioral modification throughout the day will be continued.  This will include frequent elevation (such as in a recliner), use of over the counter pain medications as needed and exercise such as walking.  The systemic causes for chronic edema such as liver, kidney and cardiac etiologies does not appear to have significant changed over the past year.    The patient will continue aggressive use of the  lymph pump.  This will continue to improve the edema control and prevent sequela such as ulcers and infections.   The patient will follow-up with me on an annual basis.   3. PAD (peripheral artery disease) ([place]) Recommend:  I do not find evidence of life style limiting vascular disease. The patient specifically denies life style limitation.  Previous noninvasive studies including ABI's of the legs do not identify critical vascular problems.  The patient should continue walking and begin a more formal exercise program. The patient should continue his antiplatelet therapy and aggressive treatment of the lipid abnormalities.  The patient is instructed to call the office if there is a significant change in the lower extremity symptoms, particularly if a wound develops or there is an abrupt increase in leg pain.  4. Gastroesophageal reflux disease, unspecified whether esophagitis present Continue PPI as already ordered, this medication has been reviewed and there are no changes at this time.  Avoidence of caffeine and alcohol  Moderate elevation of the head of the bed   5. DDD (degenerative disc disease), lumbosacral Continue NSAID medications as already ordered, these medications have been reviewed and there are no changes at this time.  Continued activity and therapy was stressed.    Hortencia Pilar,  MD  03/30/2022 3:42 PM

## 2022-03-31 ENCOUNTER — Ambulatory Visit (INDEPENDENT_AMBULATORY_CARE_PROVIDER_SITE_OTHER): Payer: Medicare Other | Admitting: Vascular Surgery

## 2022-03-31 ENCOUNTER — Encounter (INDEPENDENT_AMBULATORY_CARE_PROVIDER_SITE_OTHER): Payer: Self-pay | Admitting: Vascular Surgery

## 2022-03-31 VITALS — BP 114/73 | HR 91 | Ht 66.0 in | Wt 180.0 lb

## 2022-03-31 DIAGNOSIS — I739 Peripheral vascular disease, unspecified: Secondary | ICD-10-CM | POA: Diagnosis not present

## 2022-03-31 DIAGNOSIS — K219 Gastro-esophageal reflux disease without esophagitis: Secondary | ICD-10-CM

## 2022-03-31 DIAGNOSIS — I872 Venous insufficiency (chronic) (peripheral): Secondary | ICD-10-CM

## 2022-03-31 DIAGNOSIS — I825Y9 Chronic embolism and thrombosis of unspecified deep veins of unspecified proximal lower extremity: Secondary | ICD-10-CM | POA: Diagnosis not present

## 2022-03-31 DIAGNOSIS — M5137 Other intervertebral disc degeneration, lumbosacral region: Secondary | ICD-10-CM

## 2022-04-05 ENCOUNTER — Encounter (INDEPENDENT_AMBULATORY_CARE_PROVIDER_SITE_OTHER): Payer: Self-pay | Admitting: Vascular Surgery

## 2022-04-07 ENCOUNTER — Ambulatory Visit
Admission: RE | Admit: 2022-04-07 | Discharge: 2022-04-07 | Disposition: A | Discharge status: Home / Self Care | Payer: Medicare Other | Source: Ambulatory Visit | Attending: Physician Assistant | Admitting: Physician Assistant

## 2022-04-07 DIAGNOSIS — Z1231 Encounter for screening mammogram for malignant neoplasm of breast: Secondary | ICD-10-CM | POA: Insufficient documentation

## 2022-05-01 NOTE — H&P (Signed)
Pre-Procedure H&P   Patient ID: Jeanette Harris is a 77 y.o. female.  Gastroenterology Provider: Annamaria Helling, DO  Referring Provider: Laurine Blazer, PA PCP: Marinda Elk, MD  Date: 05/02/2022  HPI Jeanette Harris is a 77 y.o. female who presents today for Colonoscopy for Abnormal CT scan, personal history of polyps, chronic abdominal pain . History of chronic abdominal pain. Patient underwent CT and CT demonstrating fibroinflammatory changes at the TI in the right colon raising concern for IBD.  Her fecal Cal is negative at 78  Regularly require stool softener for constipation which seems to help per the patient. Only notes blood associated with stool with straining.  Reports some additional blood mixed in recently. No other melena.  She has some epigastric discomfort with food.  On Pamelor for migraines  No reflux symptoms on PPI Most recent lab work creatinine 0.9 MCV 102.4 hemoglobin 12.8 platelets 255,000  Underwent colonoscopy in February 2023 with 2 tubular adenomatous polyps in sigmoid and transverse diverticulosis.  She was noted to have tubular adenomatous in 2007 2011 2016 and 2020  Maternal first cousin with colorectal cancer  She is status post hysterectomy cholecystectomy appendectomy and C-spine surgery   Past Medical History:  Diagnosis Date   Arthritis    Basal cell carcinoma 12/28/2019   R upper buttock   Complication of anesthesia    COPD (chronic obstructive pulmonary disease) ([place])    patient denies   DVT (deep venous thrombosis) ([place]) 04/01/2017   left lower leg   GERD (gastroesophageal reflux disease)    Headache    Heart murmur    History of bleeding ulcers    History of colon polyps    Hypertension    IBS (irritable bowel syndrome)    Peripheral vascular disease (HCC)    phlebitis   Phlebitis    PONV (postoperative nausea and vomiting)    Skin cancer 03/2019   Right wrist BCC/SCC?   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   SCC IS, R upper arm, [facility] 10/08/21    Past Surgical History:  Procedure Laterality Date   ABDOMINAL HYSTERECTOMY     ANKLE SURGERY Right [place] AND 1990   CATARACT EXTRACTION W/PHACO Left 02/13/2016   Procedure: CATARACT EXTRACTION PHACO AND INTRAOCULAR LENS PLACEMENT ([place]);  Surgeon: Leandrew Koyanagi, MD;  Location: [place];  Service: Ophthalmology;  Laterality: Left;  TORIC   CATARACT EXTRACTION W/PHACO Right 02/27/2016   Procedure: CATARACT EXTRACTION PHACO AND INTRAOCULAR LENS PLACEMENT (I[facility]);  Surgeon: Leandrew Koyanagi, MD;  Location: [place];  Service: Ophthalmology;  Laterality: Right;  TORIC Latex sensitivity   CERVICAL FUSION  1992   CHOLECYSTECTOMY  2004   COLONOSCOPY     COLONOSCOPY WITH PROPOFOL N/A 04/19/2018   Procedure: COLONOSCOPY WITH PROPOFOL;  Surgeon: Manya Silvas, MD;  Location: [facility] ENDOSCOPY;  Service: Endoscopy;  Laterality: N/A;   COLONOSCOPY WITH PROPOFOL N/A 05/01/2021   Procedure: COLONOSCOPY WITH PROPOFOL;  Surgeon: Robert Bellow, MD;  Location: [facility] ENDOSCOPY;  Service: Endoscopy;  Laterality: N/A;   ESOPHAGOGASTRODUODENOSCOPY     ESOPHAGOGASTRODUODENOSCOPY (EGD) WITH PROPOFOL N/A 04/19/2018   Procedure: ESOPHAGOGASTRODUODENOSCOPY (EGD) WITH PROPOFOL;  Surgeon: Manya Silvas, MD;  [facility]: [facility] ENDOSCOPY;  Service: Endoscopy;  Laterality: N/A;   ESOPHAGOGASTRODUODENOSCOPY (EGD) WITH PROPOFOL N/A 05/01/2021   Procedure: ESOPHAGOGASTRODUODENOSCOPY (EGD) WITH PROPOFOL;  Surgeon: Robert Bellow, MD;  Location: [facility]  ENDOSCOPY;  Service: Endoscopy;  Laterality: N/A;   feet surgery  2000   FOOT SURGERY  2014   KNEE ARTHROSCOPY WITH MENISCAL REPAIR Left 10/01/2017   Procedure: KNEE ARTHROSCOPY PARTIAL AND LATERAL MENISCECTOMY AND CHONDROPLASTY;  Surgeon: Leim Fabry, MD;  Location: [place];  Service: Orthopedics;  Laterality: Left;   right ankle  surgery  1977   SHOULDER ARTHROSCOPY      Family History Maternal first cousin colorectal cancer No h/o GI disease or malignancy  Review of Systems  Constitutional:  Negative for activity change, appetite change, chills, diaphoresis, fatigue, fever and unexpected weight change.  HENT:  Negative for trouble swallowing and voice change.   Respiratory:  Negative for shortness of breath and wheezing.   Cardiovascular:  Negative for chest pain, palpitations and leg swelling.  Gastrointestinal:  Positive for abdominal pain and constipation. Negative for abdominal distention, anal bleeding, blood in stool, diarrhea, nausea, rectal pain and vomiting.  Musculoskeletal:  Negative for arthralgias and myalgias.  Skin:  Negative for color change and pallor.  Neurological:  Negative for dizziness, syncope and weakness.  Psychiatric/Behavioral:  Negative for confusion.   All other systems reviewed and are negative.    Medications No current facility-administered medications on file prior to encounter.   Current Outpatient Medications on File Prior to Encounter  Medication Sig Dispense Refill   lansoprazole (PREVACID) 30 MG capsule Take 30 mg by mouth daily at 12 noon.     sucralfate (CARAFATE) 1 g tablet Take 1 g by mouth 4 (four) times daily -  with meals and at bedtime.     topiramate (TOPAMAX) 100 MG tablet Take 100 mg by mouth 2 (two) times daily.     acetaminophen (TYLENOL) 325 MG tablet Take 1,000 mg by mouth in the morning and at bedtime.     calcium-vitamin D (OSCAL WITH D) 500-200 MG-UNIT tablet Take 1 tablet by mouth.     docusate sodium (COLACE) 50 MG capsule Take 50 mg by mouth 2 (two) times daily.     nortriptyline (PAMELOR) 75 MG capsule Take 75 mg by mouth at bedtime.     Potassium 95 MG TABS Take 1 tablet by mouth daily.     vitamin E 180 MG (400 UNITS) capsule Take by mouth.      Pertinent medications related to GI and procedure were reviewed by me with the patient prior to the  procedure  No current facility-administered medications for this encounter.      Allergies  Allergen Reactions   Iodinated Contrast Media Nausea And Vomiting and Hives   Aspirin Other (See Comments)    Bleeding ulcers in the stomach   Metrizamide Nausea And Vomiting   Nsaids Other (See Comments)    Unknown   Latex Rash   Allergies were reviewed by me prior to the procedure  Objective   Body mass index is 28.5 kg/m. Vitals:   05/02/22 1228  BP: 134/70  Pulse: 96  Resp: 18  Temp: (!) 97 F (36.1 C)  TempSrc: Temporal  SpO2: 100%  Weight: 80.1 kg  Height: '5\' 6"'$  (1.676 m)     Physical Exam Vitals and nursing note reviewed.  Constitutional:      General: She is not in acute distress.    Appearance: Normal appearance. She is not ill-appearing, toxic-appearing or diaphoretic.  HENT:     Head: Normocephalic and atraumatic.     Nose: Nose normal.     Mouth/Throat:     Mouth: Mucous  membranes are moist.     Pharynx: Oropharynx is clear.  Eyes:     General: No scleral icterus.    Extraocular Movements: Extraocular movements intact.  Cardiovascular:     Rate and Rhythm: Normal rate and regular rhythm.     Heart sounds: Normal heart sounds. No murmur heard.    No friction rub. No gallop.  Pulmonary:     Effort: Pulmonary effort is normal. No respiratory distress.     Breath sounds: Normal breath sounds. No wheezing, rhonchi or rales.  Abdominal:     General: Bowel sounds are normal. There is no distension.     Palpations: Abdomen is soft.     Tenderness: There is no abdominal tenderness. There is no guarding or rebound.  Musculoskeletal:     Cervical back: Neck supple.     Right lower leg: No edema.     Left lower leg: No edema.  Skin:    General: Skin is warm and dry.     Coloration: Skin is not jaundiced or pale.  Neurological:     Mental Status: She is alert and oriented to person, place, and time. Mental status is at baseline.     Comments: Jaw tremor   Psychiatric:        Mood and Affect: Mood normal.        Behavior: Behavior normal.        Thought Content: Thought content normal.        Judgment: Judgment normal.      Assessment:  Jeanette Harris is a 77 y.o. female  who presents today for Colonoscopy for Abnormal CT scan, personal history of polyps, chronic abdominal pain .  Plan:  Colonoscopy with possible intervention today  Colonoscopy with possible biopsy, control of bleeding, polypectomy, and interventions as necessary has been discussed with the patient/patient representative. Informed consent was obtained from the patient/patient representative after explaining the indication, nature, and risks of the procedure including but not limited to death, bleeding, perforation, missed neoplasm/lesions, cardiorespiratory compromise, and reaction to medications. Opportunity for questions was given and appropriate answers were provided. Patient/patient representative has verbalized understanding is amenable to undergoing the procedure.   Annamaria Helling, DO  [facility]  Portions of the record may have been created with voice recognition software. Occasional wrong-word or 'sound-a-like' substitutions may have occurred due to the inherent limitations of voice recognition software.  Read the chart carefully and recognize, using context, where substitutions may have occurred.

## 2022-05-02 ENCOUNTER — Encounter: Payer: Self-pay | Admitting: Gastroenterology

## 2022-05-02 ENCOUNTER — Other Ambulatory Visit: Payer: Self-pay

## 2022-05-02 ENCOUNTER — Ambulatory Visit: Payer: Medicare Other | Admitting: Anesthesiology

## 2022-05-02 ENCOUNTER — Ambulatory Visit
Admission: RE | Admit: 2022-05-02 | Discharge: 2022-05-02 | Disposition: A | Discharge status: Home / Self Care | Payer: Medicare Other | Attending: Gastroenterology | Admitting: Gastroenterology

## 2022-05-02 ENCOUNTER — Encounter
Admission: RE | Disposition: A | Discharge status: Home / Self Care | Payer: Self-pay | Source: Home / Self Care | Attending: Gastroenterology

## 2022-05-02 DIAGNOSIS — M1990 Unspecified osteoarthritis, unspecified site: Secondary | ICD-10-CM | POA: Diagnosis not present

## 2022-05-02 DIAGNOSIS — K640 First degree hemorrhoids: Secondary | ICD-10-CM | POA: Diagnosis not present

## 2022-05-02 DIAGNOSIS — D122 Benign neoplasm of ascending colon: Secondary | ICD-10-CM | POA: Diagnosis not present

## 2022-05-02 DIAGNOSIS — Z87891 Personal history of nicotine dependence: Secondary | ICD-10-CM | POA: Diagnosis not present

## 2022-05-02 DIAGNOSIS — K5730 Diverticulosis of large intestine without perforation or abscess without bleeding: Secondary | ICD-10-CM | POA: Diagnosis not present

## 2022-05-02 DIAGNOSIS — K6389 Other specified diseases of intestine: Secondary | ICD-10-CM | POA: Diagnosis not present

## 2022-05-02 DIAGNOSIS — D124 Benign neoplasm of descending colon: Secondary | ICD-10-CM | POA: Diagnosis not present

## 2022-05-02 DIAGNOSIS — F419 Anxiety disorder, unspecified: Secondary | ICD-10-CM | POA: Insufficient documentation

## 2022-05-02 DIAGNOSIS — I10 Essential (primary) hypertension: Secondary | ICD-10-CM | POA: Diagnosis not present

## 2022-05-02 DIAGNOSIS — D120 Benign neoplasm of cecum: Secondary | ICD-10-CM | POA: Diagnosis not present

## 2022-05-02 DIAGNOSIS — R933 Abnormal findings on diagnostic imaging of other parts of digestive tract: Secondary | ICD-10-CM | POA: Insufficient documentation

## 2022-05-02 DIAGNOSIS — K621 Rectal polyp: Secondary | ICD-10-CM | POA: Insufficient documentation

## 2022-05-02 DIAGNOSIS — J449 Chronic obstructive pulmonary disease, unspecified: Secondary | ICD-10-CM | POA: Insufficient documentation

## 2022-05-02 DIAGNOSIS — D125 Benign neoplasm of sigmoid colon: Secondary | ICD-10-CM | POA: Insufficient documentation

## 2022-05-02 DIAGNOSIS — K219 Gastro-esophageal reflux disease without esophagitis: Secondary | ICD-10-CM | POA: Insufficient documentation

## 2022-05-02 DIAGNOSIS — F32A Depression, unspecified: Secondary | ICD-10-CM | POA: Diagnosis not present

## 2022-05-02 DIAGNOSIS — D123 Benign neoplasm of transverse colon: Secondary | ICD-10-CM | POA: Insufficient documentation

## 2022-05-02 DIAGNOSIS — I739 Peripheral vascular disease, unspecified: Secondary | ICD-10-CM | POA: Diagnosis not present

## 2022-05-02 HISTORY — PX: COLONOSCOPY: SHX5424

## 2022-05-02 SURGERY — COLONOSCOPY
Anesthesia: General

## 2022-05-02 MED ORDER — LIDOCAINE HCL (CARDIAC) PF 100 MG/5ML IV SOSY
PREFILLED_SYRINGE | INTRAVENOUS | Status: DC | PRN
  Administered 2022-05-02: 40 mg via INTRAVENOUS

## 2022-05-02 MED ORDER — PROPOFOL 500 MG/50ML IV EMUL
INTRAVENOUS | Status: DC | PRN
  Administered 2022-05-02: 150 ug/kg/min via INTRAVENOUS

## 2022-05-02 MED ORDER — PROPOFOL 10 MG/ML IV BOLUS
INTRAVENOUS | Status: DC | PRN
  Administered 2022-05-02: 50 mg via INTRAVENOUS

## 2022-05-02 MED ORDER — SODIUM CHLORIDE 0.9 % IV SOLN: INTRAVENOUS | Status: DC

## 2022-05-02 NOTE — Anesthesia Preprocedure Evaluation (Signed)
Anesthesia Evaluation  Patient identified by MRN, date of birth, ID band Patient awake    Reviewed: Allergy & Precautions, NPO status , Patient's Chart, lab work & pertinent test results  History of Anesthesia Complications (+) PONV and history of anesthetic complications  Airway Mallampati: III  TM Distance: >3 FB Neck ROM: full    Dental  (+) Chipped   Pulmonary COPD, former smoker   Pulmonary exam normal        Cardiovascular hypertension, + Peripheral Vascular Disease  Normal cardiovascular exam+ Valvular Problems/Murmurs      Neuro/Psych  Headaches PSYCHIATRIC DISORDERS Anxiety Depression     Neuromuscular disease    GI/Hepatic Neg liver ROS,GERD  Controlled,,  Endo/Other  negative endocrine ROS    Renal/GU negative Renal ROS  negative genitourinary   Musculoskeletal  (+) Arthritis ,    Abdominal   Peds  Hematology negative hematology ROS (+)   Anesthesia Other Findings Past Medical History: No date: Arthritis 12/28/2019: Basal cell carcinoma     Comment:  R upper buttock No date: Complication of anesthesia No date: COPD (chronic obstructive pulmonary disease) (HCC)     Comment:  patient denies 04/01/2017: DVT (deep venous thrombosis) (HCC)     Comment:  left lower leg No date: GERD (gastroesophageal reflux disease) No date: Headache No date: Heart murmur No date: History of bleeding ulcers No date: History of colon polyps No date: Hypertension No date: IBS (irritable bowel syndrome) No date: Peripheral vascular disease (HCC)     Comment:  phlebitis No date: Phlebitis No date: PONV (postoperative nausea and vomiting) 03/2019: Skin cancer     Comment:  Right wrist BCC/SCC? 09/11/2021: Squamous cell carcinoma of skin     Comment:  SCC IS, R upper arm, [facility] 10/08/21  Past Surgical History: No date: ABDOMINAL HYSTERECTOMY 1977: ANKLE SURGERY; Right 1987: APPENDECTOMY No date: BACK SURGERY      Comment:  [place] 02/13/2016: CATARACT EXTRACTION W/PHACO; Left     Comment:  Procedure: CATARACT EXTRACTION PHACO AND INTRAOCULAR               LENS PLACEMENT (I[facility]);  Surgeon: Leandrew Koyanagi, MD;               Location: [place];  Service: Ophthalmology;                Laterality: Left;  TORIC 02/27/2016: CATARACT EXTRACTION W/PHACO; Right     Comment:  Procedure: CATARACT EXTRACTION PHACO AND INTRAOCULAR               LENS PLACEMENT ([facility]);  Surgeon: Leandrew Koyanagi, MD;               Location: [place];  Service: Ophthalmology;                Laterality: Right;  TORIC Latex sensitivity 1992: CERVICAL FUSION 2004: CHOLECYSTECTOMY No date: COLONOSCOPY 04/19/2018: COLONOSCOPY WITH PROPOFOL; N/A     Comment:  Procedure: COLONOSCOPY WITH PROPOFOL;  Surgeon: Manya Silvas, MD;  Location: [facility] ENDOSCOPY;  Service:               Endoscopy;  Laterality: N/A; 05/01/2021: COLONOSCOPY WITH PROPOFOL; N/A     Comment:  Procedure: COLONOSCOPY WITH PROPOFOL;  Surgeon: Robert Bellow, MD;  Location: [facility] ENDOSCOPY;  Service:  Endoscopy;  Laterality: N/A; No date: ESOPHAGOGASTRODUODENOSCOPY 04/19/2018: ESOPHAGOGASTRODUODENOSCOPY (EGD) WITH PROPOFOL; N/A     Comment:  Procedure: ESOPHAGOGASTRODUODENOSCOPY (EGD) WITH               PROPOFOL;  Surgeon: Manya Silvas, MD;  Location:               [facility] ENDOSCOPY;  Service: Endoscopy;  Laterality: N/A; 05/01/2021: ESOPHAGOGASTRODUODENOSCOPY (EGD) WITH PROPOFOL; N/A     Comment:  Procedure: ESOPHAGOGASTRODUODENOSCOPY (EGD) WITH               PROPOFOL;  Surgeon: Robert Bellow, MD;  Location:               [facility] ENDOSCOPY;  Service: Endoscopy;  Laterality: N/A; 2000: feet surgery 2014: FOOT SURGERY 10/01/2017: KNEE ARTHROSCOPY WITH MENISCAL REPAIR; Left     Comment:  Procedure: KNEE ARTHROSCOPY PARTIAL AND LATERAL               MENISCECTOMY AND CHONDROPLASTY;   Surgeon: Leim Fabry,               MD;  Location: [place];  Service:               Orthopedics;  Laterality: Left; 1977: right ankle surgery No date: SHOULDER ARTHROSCOPY  BMI    Body Mass Index: 28.50 kg/m      Reproductive/Obstetrics negative OB ROS                              Anesthesia Physical Anesthesia Plan  ASA: 2  Anesthesia Plan: General   Post-op Pain Management:    Induction: Intravenous  PONV Risk Score and Plan: Propofol infusion and TIVA  Airway Management Planned: Natural Airway and Nasal Cannula  Additional Equipment:   Intra-op Plan:   Post-operative Plan:   Informed Consent: I have reviewed the patients History and Physical, chart, labs and discussed the procedure including the risks, benefits and alternatives for the proposed anesthesia with the patient or authorized representative who has indicated his/her understanding and acceptance.     Dental Advisory Given  Plan Discussed with: Anesthesiologist, CRNA and Surgeon  Anesthesia Plan Comments: (Patient consented for risks of anesthesia including but not limited to:  - adverse reactions to medications - risk of airway placement if required - damage to eyes, teeth, lips or other oral mucosa - nerve damage due to positioning  - sore throat or hoarseness - Damage to heart, brain, nerves, lungs, other parts of body or loss of life  Patient voiced understanding.)         Anesthesia Quick Evaluation

## 2022-05-02 NOTE — Anesthesia Postprocedure Evaluation (Signed)
Anesthesia Post Note  Patient: Jeanette Harris  Procedure(s) Performed: COLONOSCOPY  Patient location during evaluation: Endoscopy Anesthesia Type: General Level of consciousness: awake and alert Pain management: pain level controlled Vital Signs Assessment: post-procedure vital signs reviewed and stable Respiratory status: spontaneous breathing, nonlabored ventilation and respiratory function stable Cardiovascular status: blood pressure returned to baseline and stable Postop Assessment: no apparent nausea or vomiting Anesthetic complications: no   No notable events documented.   Last Vitals:  Vitals:   05/02/22 1228 05/02/22 1323  BP: 134/70 117/71  Pulse: 96 76  Resp: 18 13  Temp: (!) 36.1 C   SpO2: 100% 100%    Last Pain:  Vitals:   05/02/22 1323  TempSrc:   PainSc: 0-No pain                 Alphonsus Sias

## 2022-05-02 NOTE — Transfer of Care (Signed)
Immediate Anesthesia Transfer of Care Note  Patient: Jeanette Harris  Procedure(s) Performed: COLONOSCOPY  Patient Location: PACU  Anesthesia Type:General  Level of Consciousness: awake and alert   Airway & Oxygen Therapy: Patient Spontanous Breathing  Post-op Assessment: Report given to RN and Post -op Vital signs reviewed and stable  Post vital signs: Reviewed and stable  Last Vitals:  Vitals Value Taken Time  BP 117/71 05/02/22 1329  Temp    Pulse 76 05/02/22 1330  Resp 13 05/02/22 1330  SpO2 100 % 05/02/22 1330  Vitals shown include unvalidated device data.  Last Pain:  Vitals:   05/02/22 1228  TempSrc: Temporal  PainSc: 0-No pain         Complications: No notable events documented.

## 2022-05-02 NOTE — Op Note (Signed)
[facility] Patient Name: Jeanette Harris Procedure Date: 05/02/2022 11:52 AM MRN: [id number] Account #: [id number] Date of Birth: 1945-11-26 Admit Type: Outpatient Age: 77 Room: [facility] ENDO ROOM 1 Gender: Female Note Status: Finalized Instrument Name: Peds Colonoscope [id number] Procedure:             Colonoscopy Indications:           Abnormal CT of the GI tract Providers:             Annamaria Helling DO, DO Medicines:             Monitored Anesthesia Care Complications:         No immediate complications. Estimated blood loss:                         Minimal. Procedure:             Pre-Anesthesia Assessment:                        - Prior to the procedure, a History and Physical was                         performed, and patient medications and allergies were                         reviewed. The patient is competent. The risks and                         benefits of the procedure and the sedation options and                         risks were discussed with the patient. All questions                         were answered and informed consent was obtained.                         Patient identification and proposed procedure were                         verified by the physician, the nurse, the anesthetist                         and the technician in the endoscopy suite. Mental                         Status Examination: alert and oriented. Airway                         Examination: normal oropharyngeal airway and neck                         mobility. Respiratory Examination: clear to                         auscultation. CV Examination: normal. Prophylactic                         Antibiotics: The patient does not require prophylactic  antibiotics. Prior Anticoagulants: The patient has                         taken no anticoagulant or antiplatelet agents. ASA                         Grade Assessment: II - A patient with  mild systemic                         disease. After reviewing the risks and benefits, the                         patient was deemed in satisfactory condition to                         undergo the procedure. The anesthesia plan was to use                         monitored anesthesia care (MAC). Immediately prior to                         administration of medications, the patient was                         re-assessed for adequacy to receive sedatives. The                         heart rate, respiratory rate, oxygen saturations,                         blood pressure, adequacy of pulmonary ventilation, and                         response to care were monitored throughout the                         procedure. The physical status of the patient was                         re-assessed after the procedure.                        After obtaining informed consent, the colonoscope was                         passed under direct vision. Throughout the procedure,                         the patient's blood pressure, pulse, and oxygen                         saturations were monitored continuously. The                         Colonoscope was introduced through the anus and                         advanced to the the terminal ileum, with  identification of the appendiceal orifice and IC                         valve. The colonoscopy was performed without                         difficulty. The patient tolerated the procedure well.                         The quality of the bowel preparation was evaluated                         using the BBPS [facility] Bowel Preparation Scale) with                         scores of: Right Colon = 2 (minor amount of residual                         staining, small fragments of stool and/or opaque                         liquid, but mucosa seen well), Transverse Colon = 3                         (entire mucosa seen well with no residual  staining,                         small fragments of stool or opaque liquid) and Left                         Colon = 2 (minor amount of residual staining, small                         fragments of stool and/or opaque liquid, but mucosa                         seen well). The total BBPS score equals 7. The quality                         of the bowel preparation was good. The terminal ileum,                         ileocecal valve, appendiceal orifice, and rectum were                         photographed. Findings:      The perianal and digital rectal examinations were normal. Pertinent       negatives include normal sphincter tone.      The terminal ileum appeared normal. Biopsies were taken with a cold       forceps for histology. Estimated blood loss was minimal.      A diffuse area of mild melanosis was found in the ascending colon and in       the cecum. Biopsies were taken with a cold forceps for histology.       Estimated blood loss was minimal.      Multiple small-mouthed diverticula were found in the recto-sigmoid  colon       and sigmoid colon. Estimated blood loss: none.      Four sessile polyps were found in the rectum (2), descending colon and       cecum. The polyps were 1 to 2 mm in size. These polyps were removed with       a jumbo cold forceps. Resection and retrieval were complete. Estimated       blood loss was minimal.      Three sessile polyps were found in the sigmoid colon, descending colon       and ascending colon. The polyps were 4 to 5 mm in size. These polyps       were removed with a cold snare. Resection and retrieval were complete.       Estimated blood loss was minimal.      A 10 to 12 mm polyp was found in the hepatic flexure. The polyp was       sessile. The polyp was removed with a cold snare. Resection and       retrieval were complete. To prevent bleeding after the polypectomy, one       hemostatic clip was successfully placed (MR conditional). There  was no       bleeding at the end of the procedure. Estimated blood loss was minimal.      No signs of IBD in ileum or right colon as described on previous       imaging. Estimated blood loss: none.      Retroflexion in the right colon was performed.      Non-bleeding internal hemorrhoids were found during retroflexion. The       hemorrhoids were Grade I (internal hemorrhoids that do not prolapse).       Estimated blood loss: none.      The exam was otherwise without abnormality on direct and retroflexion       views. Impression:            - The examined portion of the ileum was normal.                         Biopsied.                        - Melanosis in the colon. Biopsied.                        - Diverticulosis in the recto-sigmoid colon and in the                         sigmoid colon.                        - Four 1 to 2 mm polyps in the rectum, in the                         descending colon and in the cecum, removed with a                         jumbo cold forceps. Resected and retrieved.                        - Three 4 to 5 mm polyps in the sigmoid colon, in the  descending colon and in the ascending colon, removed                         with a cold snare. Resected and retrieved.                        - One 10 to 12 mm polyp at the hepatic flexure,                         removed with a cold snare. Resected and retrieved.                         Clip (MR conditional) was placed.                        - Non-bleeding internal hemorrhoids.                        - The examination was otherwise normal on direct and                         retroflexion views. Recommendation:        - Patient has a contact number available for                         emergencies. The signs and symptoms of potential                         delayed complications were discussed with the patient.                         Return to normal activities tomorrow. Written                          discharge instructions were provided to the patient.                        - Discharge patient to home.                        - Resume previous diet.                        - Continue present medications.                        - No aspirin, ibuprofen, naproxen, or other                         non-steroidal anti-inflammatory drugs for 5 days after                         polyp removal.                        - Await pathology results.                        - Repeat colonoscopy in 3 years for surveillance based  on pathology results.                        - Return to GI office as previously scheduled.                        - The findings and recommendations were discussed with                         the patient. Procedure Code(s):     --- Professional ---                        [phone], Colonoscopy, flexible; with removal of                         tumor(s), polyp(s), or other lesion(s) by snare                         technique                        45380, 77, Colonoscopy, flexible; with biopsy, single                         or multiple Diagnosis Code(s):     --- Professional ---                        K64.0, First degree hemorrhoids                        K63.89, Other specified diseases of intestine                        D12.8, Benign neoplasm of rectum                        D12.0, Benign neoplasm of cecum                        D12.5, Benign neoplasm of sigmoid colon                        D12.4, Benign neoplasm of descending colon                        D12.2, Benign neoplasm of ascending colon                        D12.3, Benign neoplasm of transverse colon (hepatic                         flexure or splenic flexure)                        K57.30, Diverticulosis of large intestine without                         perforation or abscess without bleeding                        R93.3, Abnormal findings on diagnostic imaging of  other parts of digestive tract CPT copyright 2022 [employer]. All rights reserved. The codes documented in this report are preliminary and upon coder review may  be revised to meet current compliance requirements. Attending Participation:      I personally performed the entire procedure. Volney American, DO Annamaria Helling DO, DO 05/02/2022 1:31:09 PM This report has been signed electronically. Number of Addenda: 0 Note Initiated On: 05/02/2022 11:52 AM Scope Withdrawal Time: 0 hours 29 minutes 28 seconds  Total Procedure Duration: 0 hours 34 minutes 2 seconds  Estimated Blood Loss:  Estimated blood loss was minimal.      [facility]

## 2022-05-02 NOTE — Interval H&P Note (Signed)
History and Physical Interval Note: Preprocedure H&P from 05/02/22  was reviewed and there was no interval change after seeing and examining the patient.  Written consent was obtained from the patient after discussion of risks, benefits, and alternatives. Patient has consented to proceed with Colonoscopy with possible intervention   05/02/2022 12:34 PM  Jeanette Harris  has presented today for surgery, with the diagnosis of Abnormal CT scan, gastrointestinal tract (R93.3).  The various methods of treatment have been discussed with the patient and family. After consideration of risks, benefits and other options for treatment, the patient has consented to  Procedure(s): COLONOSCOPY (N/A) as a surgical intervention.  The patient's history has been reviewed, patient examined, no change in status, stable for surgery.  I have reviewed the patient's chart and labs.  Questions were answered to the patient's satisfaction.     Annamaria Helling

## 2022-05-05 ENCOUNTER — Encounter: Payer: Self-pay | Admitting: Gastroenterology

## 2022-05-05 LAB — SURGICAL PATHOLOGY

## 2022-05-06 ENCOUNTER — Ambulatory Visit: Payer: Medicare Other | Admitting: Dermatology

## 2022-05-06 ENCOUNTER — Other Ambulatory Visit: Payer: Self-pay

## 2022-05-06 VITALS — BP 119/62 | HR 74

## 2022-05-06 DIAGNOSIS — L821 Other seborrheic keratosis: Secondary | ICD-10-CM

## 2022-05-06 DIAGNOSIS — L578 Other skin changes due to chronic exposure to nonionizing radiation: Secondary | ICD-10-CM | POA: Diagnosis not present

## 2022-05-06 DIAGNOSIS — L820 Inflamed seborrheic keratosis: Secondary | ICD-10-CM

## 2022-05-06 DIAGNOSIS — Z85828 Personal history of other malignant neoplasm of skin: Secondary | ICD-10-CM | POA: Diagnosis not present

## 2022-05-06 DIAGNOSIS — L814 Other melanin hyperpigmentation: Secondary | ICD-10-CM | POA: Diagnosis not present

## 2022-05-06 DIAGNOSIS — D7589 Other specified diseases of blood and blood-forming organs: Secondary | ICD-10-CM

## 2022-05-06 DIAGNOSIS — E538 Deficiency of other specified B group vitamins: Secondary | ICD-10-CM

## 2022-05-06 NOTE — Progress Notes (Signed)
   Follow-Up Visit   Subjective  Jeanette Harris is a 77 y.o. female who presents for the following: Follow-up.  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 Treated with EDC last visit. She has a few itchy, irritated spots  on her left hand, legs, left arm. Patient picks at.    The following portions of the chart were reviewed this encounter and updated as appropriate:       Review of Systems:  No other skin or systemic complaints except as noted in HPI or Assessment and Plan.  Objective  Well appearing patient in no apparent distress; mood and affect are within normal limits.  A focused examination was performed including face, arms, hands. Relevant physical exam findings are noted in the Assessment and Plan.  L thenar hand x 1, L mid forearm x 1, R pretibia x 1, L lat lower leg x 1, L upper calf x 2 (6) Erythematous stuck-on, waxy papule  Right Upper Arm Well healed scar with no evidence of recurrence.     Assessment & Plan  Actinic Damage - chronic, secondary to cumulative UV radiation exposure/sun exposure over time - diffuse scaly erythematous macules with underlying dyspigmentation - Recommend daily broad spectrum sunscreen SPF 30+ to sun-exposed areas, reapply every 2 hours as needed.  - Recommend staying in the shade or wearing long sleeves, sun glasses (UVA+UVB protection) and wide brim hats (4-inch brim around the entire circumference of the hat). - Call for new or changing lesions.  Seborrheic Keratoses - Stuck-on, waxy, tan-brown papules and/or plaques  - Benign-appearing - Discussed benign etiology and prognosis. - Observe - Call for any changes  Lentigines - Scattered tan macules - Due to sun exposure - Benign-appearing, observe - Recommend daily broad spectrum sunscreen SPF 30+ to sun-exposed areas, reapply every 2 hours as needed. - Call for any changes  Inflamed seborrheic keratosis (6) L thenar hand x 1, L mid  forearm x 1, R pretibia x 1, L lat lower leg x 1, L upper calf x 2  Symptomatic, irritating, patient would like treated.  Destruction of lesion - L thenar hand x 1, L mid forearm x 1, R pretibia x 1, L lat lower leg x 1, L upper calf x 2  Destruction method: cryotherapy   Informed consent: discussed and consent obtained   Lesion destroyed using liquid nitrogen: Yes   Region frozen until ice ball extended beyond lesion: Yes   Outcome: patient tolerated procedure well with no complications   Post-procedure details: wound care instructions given   Additional details:  Prior to procedure, discussed risks of blister formation, small wound, skin dyspigmentation, or rare scar following cryotherapy. Recommend Vaseline ointment to treated areas while healing.   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 Observe for recurrence. Call clinic for new or changing lesions.  Recommend regular skin exams, daily broad-spectrum spf 30+ sunscreen use, and photoprotection.     Return as scheduled, for TBSE, Hx SCC, Hx BCC.  IJamesetta Orleans, CMA, am acting as scribe for Brendolyn Patty, MD .  Documentation: I have reviewed the above documentation for accuracy and completeness, and I agree with the above.  Brendolyn Patty MD

## 2022-05-06 NOTE — Patient Instructions (Addendum)

## 2022-05-07 ENCOUNTER — Ambulatory Visit: Payer: Medicare Other | Admitting: Oncology

## 2022-05-07 ENCOUNTER — Inpatient Hospital Stay: Payer: Medicare Other | Attending: Oncology

## 2022-05-07 DIAGNOSIS — D7589 Other specified diseases of blood and blood-forming organs: Secondary | ICD-10-CM | POA: Insufficient documentation

## 2022-05-07 DIAGNOSIS — E538 Deficiency of other specified B group vitamins: Secondary | ICD-10-CM | POA: Insufficient documentation

## 2022-05-07 LAB — CMP (CANCER CENTER ONLY)
ALT: 17 U/L (ref 0–44)
AST: 18 U/L (ref 15–41)
Albumin: 4.3 g/dL (ref 3.5–5.0)
Alkaline Phosphatase: 63 U/L (ref 38–126)
Anion gap: 11 (ref 5–15)
BUN: 16 mg/dL (ref 8–23)
CO2: 23 mmol/L (ref 22–32)
Calcium: 9.4 mg/dL (ref 8.9–10.3)
Chloride: 102 mmol/L (ref 98–111)
Creatinine: 0.9 mg/dL (ref 0.44–1.00)
GFR, Estimated: >60 mL/min (ref >60)
Glucose, Bld: 96 mg/dL (ref 70–99)
Potassium: 3.8 mmol/L (ref 3.5–5.1)
Sodium: 136 mmol/L (ref 135–145)
Total Bilirubin: 0.4 mg/dL (ref 0.3–1.2)
Total Protein: 7.7 g/dL (ref 6.5–8.1)

## 2022-05-07 LAB — CBC WITH DIFFERENTIAL (CANCER CENTER ONLY)
Abs Immature Granulocytes: 0.01 10*3/uL (ref 0.00–0.07)
Basophils Absolute: 0.1 10*3/uL (ref 0.0–0.1)
Basophils Relative: 1 %
Eosinophils Absolute: 0.2 10*3/uL (ref 0.0–0.5)
Eosinophils Relative: 4 %
HCT: 39.9 % (ref 36.0–46.0)
Hemoglobin: 13.1 g/dL (ref 12.0–15.0)
Immature Granulocytes: 0 %
Lymphocytes Relative: 31 %
Lymphs Abs: 1.7 10*3/uL (ref 0.7–4.0)
MCH: 33.9 pg (ref 26.0–34.0)
MCHC: 32.8 g/dL (ref 30.0–36.0)
MCV: 103.4 fL — ABNORMAL HIGH (ref 80.0–100.0)
Monocytes Absolute: 0.5 10*3/uL (ref 0.1–1.0)
Monocytes Relative: 8 %
Neutro Abs: 3.2 10*3/uL (ref 1.7–7.7)
Neutrophils Relative %: 56 %
Platelet Count: 240 10*3/uL (ref 150–400)
RBC: 3.86 MIL/uL — ABNORMAL LOW (ref 3.87–5.11)
RDW: 12.1 % (ref 11.5–15.5)
WBC Count: 5.6 10*3/uL (ref 4.0–10.5)
nRBC: 0 % (ref 0.0–0.2)

## 2022-05-07 LAB — VITAMIN B12: Vitamin B-12: 877 pg/mL (ref 180–914)

## 2022-05-07 LAB — FOLATE: Folate: 10 ng/mL (ref >5.9)

## 2022-05-08 LAB — KAPPA/LAMBDA LIGHT CHAINS
Kappa free light chain: 30.5 mg/L — ABNORMAL HIGH (ref 3.3–19.4)
Kappa, lambda light chain ratio: 1.48 (ref 0.26–1.65)
Lambda free light chains: 20.6 mg/L (ref 5.7–26.3)

## 2022-05-13 LAB — MULTIPLE MYELOMA PANEL, SERUM
Albumin SerPl Elph-Mcnc: 4.1 g/dL (ref 2.9–4.4)
Albumin/Glob SerPl: 1.4 (ref 0.7–1.7)
Alpha 1: 0.3 g/dL (ref 0.0–0.4)
Alpha2 Glob SerPl Elph-Mcnc: 0.8 g/dL (ref 0.4–1.0)
B-Globulin SerPl Elph-Mcnc: 1.1 g/dL (ref 0.7–1.3)
Gamma Glob SerPl Elph-Mcnc: 1 g/dL (ref 0.4–1.8)
Globulin, Total: 3.1 g/dL (ref 2.2–3.9)
IgA: 206 mg/dL (ref 64–422)
IgG (Immunoglobin G), Serum: 973 mg/dL (ref 586–1602)
IgM (Immunoglobulin M), Srm: 102 mg/dL (ref 26–217)
Total Protein ELP: 7.2 g/dL (ref 6.0–8.5)

## 2022-05-14 ENCOUNTER — Inpatient Hospital Stay: Payer: Medicare Other | Attending: Oncology | Admitting: Oncology

## 2022-05-14 ENCOUNTER — Encounter: Payer: Self-pay | Admitting: Oncology

## 2022-05-14 VITALS — BP 125/58 | HR 78 | Temp 97.2°F | Resp 16 | Ht 66.0 in | Wt 181.0 lb

## 2022-05-14 DIAGNOSIS — Z87891 Personal history of nicotine dependence: Secondary | ICD-10-CM | POA: Diagnosis not present

## 2022-05-14 DIAGNOSIS — Z800 Family history of malignant neoplasm of digestive organs: Secondary | ICD-10-CM | POA: Diagnosis not present

## 2022-05-14 DIAGNOSIS — I10 Essential (primary) hypertension: Secondary | ICD-10-CM | POA: Insufficient documentation

## 2022-05-14 DIAGNOSIS — E538 Deficiency of other specified B group vitamins: Secondary | ICD-10-CM

## 2022-05-14 DIAGNOSIS — R230 Cyanosis: Secondary | ICD-10-CM | POA: Diagnosis not present

## 2022-05-14 DIAGNOSIS — Z8042 Family history of malignant neoplasm of prostate: Secondary | ICD-10-CM | POA: Diagnosis not present

## 2022-05-14 DIAGNOSIS — R200 Anesthesia of skin: Secondary | ICD-10-CM | POA: Diagnosis not present

## 2022-05-14 DIAGNOSIS — Z85828 Personal history of other malignant neoplasm of skin: Secondary | ICD-10-CM | POA: Diagnosis not present

## 2022-05-14 DIAGNOSIS — D7589 Other specified diseases of blood and blood-forming organs: Secondary | ICD-10-CM | POA: Diagnosis not present

## 2022-05-14 DIAGNOSIS — Z803 Family history of malignant neoplasm of breast: Secondary | ICD-10-CM | POA: Insufficient documentation

## 2022-05-14 DIAGNOSIS — R202 Paresthesia of skin: Secondary | ICD-10-CM | POA: Diagnosis not present

## 2022-05-14 DIAGNOSIS — Z90710 Acquired absence of both cervix and uterus: Secondary | ICD-10-CM | POA: Insufficient documentation

## 2022-05-14 DIAGNOSIS — Z801 Family history of malignant neoplasm of trachea, bronchus and lung: Secondary | ICD-10-CM | POA: Diagnosis not present

## 2022-05-14 NOTE — Progress Notes (Signed)
Hematology/Harris Progress note [phone] [phone]    CHIEF COMPLAINTS/REASON FOR VISIT:  Follow up for macrocytosis  ASSESSMENT & PLAN:   Macrocytosis Macrocytosis, chronic, no anemia.  MCV is increasing. Labs reviewed and discussed with patient. Patient had extensive work-up previously including adequate B12, folate, normal LDH, SPEP showed negative M protein, normal light chain ratio.  Cannot rule out underlying bone marrow disorder, i.e. MDS Currently her counts are stable with no signs of cytopenia.  No constitutional symptoms. I recommend observation.  Peripheral cyanosis Previous workup showed normal ANA, normal viscosity, normal cryoglobulin level.  Normal protein electrophoresis.  Patient does not need to follow-up routinely with hematology clinic.  I recommend patient to continue follow up with primary care physician. Patient may re-establish care in the future if clinically indicated.   All questions were answered. The patient knows to call the clinic with any problems, questions or concerns.  Jeanette Server, Jeanette Harris, Jeanette Harris 05/14/2022   HISTORY OF PRESENTING ILLNESS:   Jeanette Harris is a  77 y.o.  female with PMH listed below was seen in consultation at the request of  Marinda Elk, Jeanette Harris  for evaluation of macrocytosis  Reviewed patient's blood work done chronic hepatitis obvious. 02/07/2020, CBC showed hemoglobin 12.8, MCV 104.5, WBC 4.4, decreased lymphocyte, increased eosinophil percentage.  Reviewed past lab work.  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  Folate and vitamin B12 level have been both as stated in past and within normal limits. Patient was referred to hematology Harris for further evaluation.  Patient reports chronic lower extremity peripheral for 10 to 20 years.  She was previously seen by vascular surgeon and was told that " this is too deep and  nothing can be done:.  He reports bilateral toes numbness tingling, cold sensation. Denies weight loss, fever, chills, fatigue, night sweats.  Patient was accompanied by her sister.  Patient has migraine and is on Topamax, chronically.  #Bilateral lower extremity bluish skin discoloration This is a chronic condition for her. Normal ANA, viscosity, negative cryoglobulin.  February 2023 patient recently had EGD and colonoscopy by Dr. Bary Castilla.  Distal esophagus biopsy showed reflux.  Colon polyp x2, 1 polyp is tubular adenoma.  The other polyp is hyperplastic polyp negative for high-grade dysplasia and malignancy  INTERVAL HISTORY Jeanette Harris is a 77 y.o. female who has above history reviewed by me today presents for follow up visit for management of macrocytosis No constitutional symptoms.  Bilateral lower extremity bluish skin discoloration worse.   Review of Systems  Constitutional:  Negative for appetite change, chills, fatigue and fever.  HENT:   Negative for hearing loss and voice change.   Eyes:  Negative for eye problems.  Respiratory:  Negative for chest tightness and cough.   Cardiovascular:  Negative for chest pain.  Gastrointestinal:  Negative for abdominal distention, abdominal pain and blood in stool.  Endocrine: Negative for hot flashes.  Genitourinary:  Negative for difficulty urinating and frequency.   Musculoskeletal:  Negative for arthralgias.  Skin:  Negative for itching and rash.  Neurological:  Negative for extremity weakness.  Hematological:  Negative for adenopathy.  Psychiatric/Behavioral:  Negative for confusion.      MEDICAL HISTORY:  Past Medical History:  Diagnosis Date   Arthritis    Basal cell carcinoma 12/28/2019   R upper buttock   Complication of anesthesia    COPD (chronic obstructive pulmonary disease) ([place])  patient denies   DVT (deep venous thrombosis) ([place]) 04/01/2017   left lower leg   GERD (gastroesophageal reflux disease)     Headache    Heart murmur    History of bleeding ulcers    History of colon polyps    Hypertension    IBS (irritable bowel syndrome)    Peripheral vascular disease (HCC)    phlebitis   Phlebitis    PONV (postoperative nausea and vomiting)    Skin cancer 03/2019   Right wrist BCC/SCC?   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   SCC IS, R upper arm, EDC 10/08/21    SURGICAL HISTORY: Past Surgical History:  Procedure Laterality Date   ABDOMINAL HYSTERECTOMY     ANKLE SURGERY Right [place] AND 1990   CATARACT EXTRACTION W/PHACO Left 02/13/2016   Procedure: CATARACT EXTRACTION PHACO AND INTRAOCULAR LENS PLACEMENT ([place]);  Surgeon: Leandrew Koyanagi, Jeanette Harris;  Location: [place];  Service: Ophthalmology;  Laterality: Left;  TORIC   CATARACT EXTRACTION W/PHACO Right 02/27/2016   Procedure: CATARACT EXTRACTION PHACO AND INTRAOCULAR LENS PLACEMENT (I[facility]);  Surgeon: Leandrew Koyanagi, Jeanette Harris;  Location: [place];  Service: Ophthalmology;  Laterality: Right;  TORIC Latex sensitivity   CERVICAL FUSION  1992   CHOLECYSTECTOMY  2004   COLONOSCOPY     COLONOSCOPY N/A 05/02/2022   Procedure: COLONOSCOPY;  Surgeon: Annamaria Helling, DO;  Location: [facility] ENDOSCOPY;  Service: Gastroenterology;  Laterality: N/A;   COLONOSCOPY WITH PROPOFOL N/A 04/19/2018   Procedure: COLONOSCOPY WITH PROPOFOL;  Surgeon: Manya Silvas, Jeanette Harris;  Location: [facility] ENDOSCOPY;  Service: Endoscopy;  Laterality: N/A;   COLONOSCOPY WITH PROPOFOL N/A 05/01/2021   Procedure: COLONOSCOPY WITH PROPOFOL;  Surgeon: Robert Bellow, Jeanette Harris;  Location: [facility] ENDOSCOPY;  Service: Endoscopy;  Laterality: N/A;   ESOPHAGOGASTRODUODENOSCOPY     ESOPHAGOGASTRODUODENOSCOPY (EGD) WITH PROPOFOL N/A 04/19/2018   Procedure: ESOPHAGOGASTRODUODENOSCOPY (EGD) WITH PROPOFOL;  Surgeon: Manya Silvas, Jeanette Harris;  Location: [facility] ENDOSCOPY;  Service: Endoscopy;  Laterality: N/A;    ESOPHAGOGASTRODUODENOSCOPY (EGD) WITH PROPOFOL N/A 05/01/2021   Procedure: ESOPHAGOGASTRODUODENOSCOPY (EGD) WITH PROPOFOL;  Surgeon: Robert Bellow, Jeanette Harris;  Location: [facility] ENDOSCOPY;  Service: Endoscopy;  Laterality: N/A;   feet surgery  2000   FOOT SURGERY  2014   KNEE ARTHROSCOPY WITH MENISCAL REPAIR Left 10/01/2017   Procedure: KNEE ARTHROSCOPY PARTIAL AND LATERAL MENISCECTOMY AND CHONDROPLASTY;  Surgeon: Leim Fabry, Jeanette Harris;  Location: [facility];  Service: Orthopedics;  Laterality: Left;   right ankle surgery  1977   SHOULDER ARTHROSCOPY      SOCIAL HISTORY: Social History   Socioeconomic History   Marital status: Married    Spouse name: Not on file   Number of children: Not on file   Years of education: Not on file   Highest education level: Not on file  Occupational History   Not on file  Tobacco Use   Smoking status: Former    Packs/day: 0.50    Years: 3.00    Total pack years: 1.50    Types: Cigarettes    Quit date: 86    Years since quitting: 44.2   Smokeless tobacco: Never   Tobacco comments:    only smoked for about 3 years   Vaping Use   Vaping Use: Never used  Substance and Sexual Activity   Alcohol use: No   Drug use: No   Sexual activity: Not on file  Other Topics  Concern   Not on file  Social History Narrative   Not on file   Social Determinants of Health   Financial Resource Strain: Not on file  Food Insecurity: Not on file  Transportation Needs: Not on file  Physical Activity: Not on file  Stress: Not on file  Social Connections: Not on file  Intimate Partner Violence: Not on file    FAMILY HISTORY: Family History  Problem Relation Age of Onset   Diabetes Mother    Heart disease Mother    Prostate cancer Father    Hypertension Father    Breast cancer Maternal Aunt    Lung cancer Maternal Aunt    Breast cancer Cousin    Colon cancer Cousin    Breast cancer Cousin    Breast cancer Niece     ALLERGIES:  is allergic to  iodinated contrast media, aspirin, metrizamide, nsaids, and latex.  MEDICATIONS:  Current Outpatient Medications  Medication Sig Dispense Refill   acetaminophen (TYLENOL) 325 MG tablet Take 1,000 mg by mouth in the morning and at bedtime.     calcium-vitamin D (OSCAL WITH D) 500-200 MG-UNIT tablet Take 1 tablet by mouth.     Cholecalciferol (VITAMIN D3) 50 MCG (2000 UT) CAPS Take 2,000 Units by mouth.     docusate sodium (COLACE) 50 MG capsule Take 50 mg by mouth 2 (two) times daily.     lansoprazole (PREVACID) 30 MG capsule Take 30 mg by mouth daily at 12 noon.     nortriptyline (PAMELOR) 75 MG capsule Take 75 mg by mouth at bedtime.     Potassium 95 MG TABS Take 1 tablet by mouth daily.     sucralfate (CARAFATE) 1 g tablet Take 1 g by mouth 4 (four) times daily -  with meals and at bedtime.     topiramate (TOPAMAX) 100 MG tablet Take 100 mg by mouth 2 (two) times daily.     vitamin E 180 MG (400 UNITS) capsule Take by mouth.     No current facility-administered medications for this visit.     PHYSICAL EXAMINATION:  Vitals:   05/14/22 1321  BP: (!) 125/58  Pulse: 78  Resp: 16  Temp: (!) 97.2 F (36.2 C)  SpO2: 100%   Filed Weights   05/14/22 1321  Weight: 181 lb (82.1 kg)   Physical Exam Constitutional:      General: She is not in acute distress.    Appearance: She is not diaphoretic.  HENT:     Head: Normocephalic.     Nose: Nose normal.  Eyes:     General: No scleral icterus.    Pupils: Pupils are equal, round, and reactive to light.  Cardiovascular:     Rate and Rhythm: Normal rate.     Heart sounds: No murmur heard. Pulmonary:     Effort: Pulmonary effort is normal. No respiratory distress.  Abdominal:     General: There is no distension.     Palpations: Abdomen is soft.     Tenderness: There is no abdominal tenderness.  Musculoskeletal:        General: Normal range of motion.     Cervical back: Normal range of motion.  Skin:    General: Skin is warm and  dry.     Findings: No erythema.     Comments: Bilateral feet bluish skin discoloration [Cyanosis]  Neurological:     Mental Status: She is alert and oriented to person, place, and time.     Cranial  Nerves: No cranial nerve deficit.     Motor: No abnormal muscle tone.     Coordination: Coordination normal.  Psychiatric:        Mood and Affect: Affect normal.        LABORATORY DATA:  I have reviewed the data as listed    Latest Ref Rng & Units 05/07/2022    1:10 PM 11/23/2021    3:06 AM 05/08/2021   11:43 AM  CBC  WBC 4.0 - 10.5 K/uL 5.6  11.2  4.1   Hemoglobin 12.0 - 15.0 g/dL 13.1  12.3  12.7   Hematocrit 36.0 - 46.0 % 39.9  37.9  38.9   Platelets 150 - 400 K/uL 240  222  215       Latest Ref Rng & Units 05/07/2022    1:10 PM 11/23/2021    3:06 AM 05/08/2021   11:43 AM  CMP  Glucose 70 - 99 mg/dL 96  113  88   BUN 8 - 23 mg/dL '16  16  15   '$ Creatinine 0.44 - 1.00 mg/dL 0.90  1.04  1.05   Sodium 135 - 145 mmol/L 136  137  137   Potassium 3.5 - 5.1 mmol/L 3.8  3.8  3.9   Chloride 98 - 111 mmol/L 102  107  106   CO2 22 - 32 mmol/L '23  24  26   '$ Calcium 8.9 - 10.3 mg/dL 9.4  9.4  9.2   Total Protein 6.5 - 8.1 g/dL 7.7  7.0  7.2   Total Bilirubin 0.3 - 1.2 mg/dL 0.4  0.7  0.3   Alkaline Phos 38 - 126 U/L 63  53  55   AST 15 - 41 U/L '18  20  14   '$ ALT 0 - 44 U/L '17  19  13      '$ Iron/TIBC/Ferritin/ %Sat No results found for: "IRON", "TIBC", "FERRITIN", "IRONPCTSAT"    RADIOGRAPHIC STUDIES: I have personally reviewed the radiological images as listed and agreed with the findings in the report. No results found.

## 2022-05-14 NOTE — Assessment & Plan Note (Signed)
Macrocytosis, chronic, no anemia.  MCV is increasing. Labs reviewed and discussed with patient. Patient had extensive work-up previously including adequate B12, folate, normal LDH, SPEP showed negative M protein, normal light chain ratio.  Cannot rule out underlying bone marrow disorder, i.e. MDS Currently her counts are stable with no signs of cytopenia.  No constitutional symptoms. I recommend observation.

## 2022-05-14 NOTE — Assessment & Plan Note (Signed)
Previous workup showed normal ANA, normal viscosity, normal cryoglobulin level.  Normal protein electrophoresis.

## 2022-07-02 NOTE — Progress Notes (Signed)
 CC:  Chief Complaint  Patient presents with  . New Patient  . Bilateral foot pain    Jeanette Harris is a 77 y.o. with a complaint of bilateral foot pain.  This been chronic in nature.  She provides a list of multiple surgeries that she has had to both lower extremities over the years.  She has a longstanding history of chronic lower extremity issues.  She has been seen by vein and vascular.  She has been diagnosed with peripheral neuropathy.  She states she has a lot of pain and tightness into the forefoot regions.  I did perform a chart review through the [facility] system where she is seen Dr. Dreama.  She has a history of a DVT as well.  They recommended use of a lymph pump on a regular basis.  She is on antiplatelet therapy as well.    PMH: Past Medical History:  Diagnosis Date  . Basal cell carcinoma 12/28/2019  . BRBPR (bright red blood per rectum) 03/31/2018  . Chronic abdominal pain, unspecified   . Chronic back pain   . 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 COPD (chronic obstructive pulmonary disease) (CMS/HHS-HCC)   . DVT (deep venous thrombosis) (CMS/HHS-HCC) 04/01/2017  . GERD (gastroesophageal reflux disease)   . H/O adenomatous polyp of colon 04/17/2014  . H/O mammogram 05/16/2013  . History of bone density study 10/15/2011  . History of colonic polyps   . Hyperlipidemia   . Hypertension   . Irritable bowel syndrome   . Irritable bowel syndrome with constipation and diarrhea 04/17/2014  . Migraines   . Murmur, cardiac   . Osteoarthritis   . Peripheral neuropathy   . Pernicious anemia   . Plantar fasciitis   . PVD (peripheral vascular disease) (CMS-HCC)   . S/P foot surgery 03/2012   Dr. Lilli  . Seasonal allergic rhinitis     Medication:  Current Outpatient Medications:  .  acetaminophen  (TYLENOL ) 500 mg capsule, Take 1,000 mg by mouth 2 (two) times daily., Disp: , Rfl:  .  calcium carbonate-vitamin D3 (OS-CAL 500+D) 500 mg(1,250mg )  -200 unit tablet, Take 1 tablet by mouth once daily  , Disp: , Rfl:  .  cholecalciferol (VITAMIN D3) 2,000 unit capsule, Take 2,000 Units by mouth, Disp: , Rfl:  .  cyanocobalamin  (VITAMIN B12) 1000 MCG tablet, Take 1,000 mcg by mouth once daily, Disp: , Rfl:  .  lansoprazole (PREVACID) 30 MG DR capsule, TAKE 1 CAPSULE BY MOUTH TWICE  DAILY, Disp: 200 capsule, Rfl: 2 .  nortriptyline  (PAMELOR ) 75 MG capsule, TAKE 1 CAPSULE BY MOUTH AT NIGHT, Disp: 90 capsule, Rfl: 3 .  potassium 99 mg Tab, Take by mouth once daily, Disp: , Rfl:  .  sodium, potassium, and magnesium (SUPREP) oral solution, Take 1 Bottle by mouth as directed One kit contains 2 bottles.  Take both bottles at the times instructed by your provider., Disp: 354 mL, Rfl: 0 .  sucralfate (CARAFATE) 1 gram tablet, Take 1 tablet (1 g total) by mouth once daily, Disp: 90 tablet, Rfl: 1 .  topiramate  (TOPAMAX ) 100 MG tablet, TAKE 1 TABLET BY MOUTH TWICE  DAILY, Disp: 180 tablet, Rfl: 2 .  vitamin E 400 UNIT capsule, Take 400 Units by mouth once daily, Disp: , Rfl:  .  alendronate (FOSAMAX) 70 MG tablet, Take 1 tablet (70 mg total) by mouth every 7 (seven) days Take with a full glass of water. Do not  lie down for the next 30 min. (Patient not taking: Reported on 04/22/2022), Disp: 4 tablet, Rfl: 3 .  fluticasone propionate (FLONASE) 50 mcg/actuation nasal spray, Place 2 sprays into both nostrils once daily (Patient not taking: Reported on 04/22/2022), Disp: 16 g, Rfl: 2  Allergies: Allergies as of 07/02/2022 - Reviewed 07/02/2022  Allergen Reaction Noted  . Aspirin Unknown and Other (See Comments) 10/27/2013  . Iodinated contrast media Hives and Nausea And Vomiting 02/12/2015  . Latex Hives and Rash 04/17/2014  . Nsaids (non-steroidal anti-inflammatory drug) Unknown and Other (See Comments) 10/27/2013  . Other Hives 10/27/2013  . Metrizamide Nausea And Vomiting 02/12/2015    Surgical History: Past Surgical History:  Procedure Laterality  Date  . HYSTERECTOMY  1972  . LAMINECTOMY LUMBAR SPINE  1986  . APPENDECTOMY  1988  . S/P cervical discectomy  1992  . Right shoulder surgery  1992  . Bilateral foot surgery  1999  . CHOLECYSTECTOMY  2004  . COLONOSCOPY  04/18/2005   Adenomatous Polyps  . COLONOSCOPY  04/03/2009   Adenomatous Polyps: CBF 03/2014; recall ltr mailed 01/31/2014 (dw)  . COLONOSCOPY  05/05/2014   Adenomatous Polyps: CBF 04/2017; Recall Ltr mailed 03/31/2017 (dh); See msg 04/03/2017 will postpone d/t blood clots in legs (dh)  . KNEE ARTHROSCOPY Left 10/01/2017  . ARTHROSCOPY KNEE W/MENISCUS REPAIR Left 10/01/2017  . COLONOSCOPY  04/19/2018   Adenomatous Polyps: CBF 04/2021  . EGD  04/19/2018   No repeat per RTE  . COLONOSCOPY  05/01/2021   Tubular adenoma/Hyperplastic polyp/Repeat 50yrs/JWB  . EGD  05/01/2021   Gastritis/Esophagitis/Gastric polyps/Repeat PRN/JWB  . Colon @ [facility]  05/02/2022   Tubular adenomas/Hyperplastic polyps/Melanosis coli/Repeat 17yrs/SMR  . EGD  11/17/2002,01/23/2003,04/18/2005,04/03/2009   No repeat per RTE (dw)    Social History:  Social History   Socioeconomic History  . Marital status: Married  Tobacco Use  . Smoking status: Former    Current packs/day: 0.00    Average packs/day: 0.5 packs/day for 3.0 years (1.5 ttl pk-yrs)    Types: Cigarettes    Start date: 03/11/1975    Quit date: 03/10/1978    Years since quitting: 44.3  . Smokeless tobacco: Never  . Tobacco comments:    quit greater than 30 years ago  Vaping Use  . Vaping status: Never Used  Substance and Sexual Activity  . Alcohol use: No    Alcohol/week: 0.0 standard drinks of alcohol  . Drug use: No  . Sexual activity: Defer   Social History   Tobacco Use  Smoking Status Former  . Current packs/day: 0.00  . Average packs/day: 0.5 packs/day for 3.0 years (1.5 ttl pk-yrs)  . Types: Cigarettes  . Start date: 03/11/1975  . Quit date: 03/10/1978  . Years since quitting: 44.3  Smokeless Tobacco Never  Tobacco  Comments   quit greater than 30 years ago    Past Family History: Family History  Problem Relation Age of Onset  . Diabetes type II Mother   . Heart disease Mother   . Stroke Mother   . High blood pressure (Hypertension) Father   . Hip fracture Father   . Prostate cancer Father   . Diabetes type II Other   . Lung cancer Maternal Aunt   . Breast cancer Maternal Aunt   . Colon cancer Cousin   . Breast cancer Cousin      Review of Systems:  A comprehensive 14 point ROS was performed, reviewed, and the pertinent orthopaedic findings are documented in the  HPI.  Objective:  General/Constitutional: No apparent distress: well-nourished and well developed.  Vascular:Left foot:  Dorsalis Pedis:  present Posterior Tibial:  present      Right foot:  Dorsalis Pedis:  present Posterior Tibial:  present  Neuro: Full sensation to light touch, vibratory, protective sensation  Derm: No ulcers or pre-ulcerative lesions.  She does have discoloration to bilateral lower extremities from the ankle distal.  This consistent with venous insufficiency hemosiderin deposits.  Ortho/MS:Painfree ROM to ankle, subtalar, midtarsal, and metatarsalphalangeal joints.  Full muscle strength to all major muscle groups of the lower extremity She is quite tight with at the gastroc regions bilaterally.  Can only get her to 90 degrees and she is very tight in the gastroc soleal region.  She has pain and discomfort diffusely through the forefoot area.  She complains of subjective pins-and-needles numbness and burning pain.  Xrays: none  The following labs were personally reviewed by myself: - Lab Results  Component Value Date   WBC 3.7 (L) 03/18/2022   HGB 12.8 03/18/2022   HCT 39.0 03/18/2022   PLT 255 03/18/2022   - Lab Results  Component Value Date   NA 140 03/18/2022   K 4.4 03/18/2022   CL 108 03/18/2022   CO2 27.5 03/18/2022   BUN 12 03/18/2022   CREATININE 0.9 03/18/2022   GLUCOSE 96  03/18/2022   - Lab Results  Component Value Date   NA 140 03/18/2022   K 4.4 03/18/2022   CL 108 03/18/2022   CO2 27.5 03/18/2022   BUN 12 03/18/2022   CREATININE 0.9 03/18/2022   CALCIUM 9.5 03/18/2022   ALB 4.2 03/18/2022   TBILI 0.3 03/18/2022   ALKPHOS 63 03/18/2022   AST 15 03/18/2022   ALT 17 03/18/2022   GLUCOSE 96 03/18/2022   GFR 67 03/18/2022   - Lab Results  Component Value Date   HGBA1C 6.1 (H) 03/18/2022    Assessment: Encounter Diagnoses  Name Primary?  . Idiopathic neuropathy Yes  . Gastrocnemius equinus, left   . Gastrocnemius equinus, right   . Idiopathic progressive neuropathy   . PAD (peripheral artery disease) (CMS-HCC)     Plan: I believe her pain is multifactorial.  She likely has idiopathic neuropathy progressive in nature.  We discussed the use of over-the-counter capsaicin cream.  We discussed oral medications but I would prefer using the topicals at this time.  She will start with low-dose capsaicin and graduate up.  We discussed stretching in great detail as she is quite tight in the gastroc soleal region as well as the entire posterior chain of the lower extremities.  This can be helpful.  We discussed continued ambulation and activity just to keep the circulation up.  She can closely monitor going forward and if there is any progressive deformity or pain I am happy to see her back at her convenience.  No orders of the defined types were placed in this encounter.   Return if symptoms worsen or fail to improve.

## 2022-09-16 ENCOUNTER — Encounter: Payer: Medicare Other | Admitting: Dermatology

## 2022-09-29 ENCOUNTER — Encounter: Payer: Self-pay | Admitting: Internal Medicine

## 2022-09-29 ENCOUNTER — Telehealth (INDEPENDENT_AMBULATORY_CARE_PROVIDER_SITE_OTHER): Payer: Self-pay

## 2022-09-29 ENCOUNTER — Encounter (INDEPENDENT_AMBULATORY_CARE_PROVIDER_SITE_OTHER): Payer: Self-pay | Admitting: Vascular Surgery

## 2022-09-29 ENCOUNTER — Ambulatory Visit (INDEPENDENT_AMBULATORY_CARE_PROVIDER_SITE_OTHER): Payer: Medicare Other | Admitting: Vascular Surgery

## 2022-09-29 ENCOUNTER — Other Ambulatory Visit: Payer: Self-pay | Admitting: Internal Medicine

## 2022-09-29 VITALS — BP 132/74 | HR 82 | Resp 17 | Ht 65.5 in | Wt 178.2 lb

## 2022-09-29 DIAGNOSIS — K219 Gastro-esophageal reflux disease without esophagitis: Secondary | ICD-10-CM

## 2022-09-29 DIAGNOSIS — I825Y9 Chronic embolism and thrombosis of unspecified deep veins of unspecified proximal lower extremity: Secondary | ICD-10-CM | POA: Diagnosis not present

## 2022-09-29 DIAGNOSIS — I872 Venous insufficiency (chronic) (peripheral): Secondary | ICD-10-CM

## 2022-09-29 DIAGNOSIS — M1712 Unilateral primary osteoarthritis, left knee: Secondary | ICD-10-CM

## 2022-09-29 DIAGNOSIS — I739 Peripheral vascular disease, unspecified: Secondary | ICD-10-CM

## 2022-09-29 NOTE — Telephone Encounter (Signed)
This was sent from Sherlyn Lick at [employer] :   Jeanette Harris, this is the patient you just called me about that said she's been calling and we haven't responded.  I just called and spoke with her. She told me she has never tried to call us because Dr. Gilda Crease told her that he was going to tell me to call her and she's been waiting months on that call. I asked her to save my number and just call my cell when she has an issue.  I'm going to adjust her pressure for comfort. Wanted to make you aware. Thank you!  Jeanette Harris Sr. Programme researcher, broadcasting/film/video Healthcare [phone] (F) [email or web address]

## 2022-09-29 NOTE — Progress Notes (Addendum)
MRN : [id number]  Jeanette Harris is a 77 y.o. (08/21/1945) female who presents with chief complaint of legs hurt and swell.  History of Present Illness:   The patient presents to the office for follow up regarding DVT.  DVT was identified in the left leg in 2019.  The initial symptoms were pain and swelling in the lower extremity.   The patient notes the leg continues to be painful especially with walking.  However, her symptoms are occurring with laying down and at night and even in the morning.  She notes that the pain is her entire leg on both sides.  Symptoms are better with elevation.  The patient notes minimal edema in the morning which steadily worsens throughout the day.     The patient has not been using compression therapy at this point because it exacerbates her pain.   No SOB or pleuritic chest pains.  No cough or hemoptysis.   No blood per rectum or blood in any sputum.  No excessive bruising per the patient.    No recent shortening of the patient's walking distance or new symptoms consistent with claudication.  No history of rest pain symptoms. No new ulcers or wounds of the lower extremities have occurred.    No outpatient medications have been marked as taking for the 09/29/22 encounter (Appointment) with Gilda Crease, Latina Craver, MD.    Past Medical History:  Diagnosis Date   Arthritis    Basal cell carcinoma 12/28/2019   R upper buttock   Complication of anesthesia    COPD (chronic obstructive pulmonary disease) (HCC)    patient denies   DVT (deep venous thrombosis) (HCC) 04/01/2017   left lower leg   GERD (gastroesophageal reflux disease)    Headache    Heart murmur    History of bleeding ulcers    History of colon polyps    Hypertension    IBS (irritable bowel syndrome)    Peripheral vascular disease (HCC)    phlebitis   Phlebitis    PONV (postoperative nausea and vomiting)    Skin cancer 03/2019   Right wrist BCC/SCC?   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   SCC IS, R upper arm, [facility] 10/08/21    Past Surgical History:  Procedure Laterality Date   ABDOMINAL HYSTERECTOMY     ANKLE SURGERY Right 1977   APPENDECTOMY  1987   BACK SURGERY     1986 AND 1990   CATARACT EXTRACTION W/PHACO Left 02/13/2016   Procedure: CATARACT EXTRACTION PHACO AND INTRAOCULAR LENS PLACEMENT (IOC);  Surgeon: Lockie Mola, MD;  Location: [facility] SURGERY [facility]TR;  Service: Ophthalmology;  Laterality: Left;  TORIC   CATARACT EXTRACTION W/PHACO Right 02/27/2016   Procedure: CATARACT EXTRACTION PHACO AND INTRAOCULAR LENS PLACEMENT (IOC);  Surgeon: Lockie Mola, MD;  Location: [facility] SUR[facility];  Service: Ophthalmology;  Laterality: Right;  TORIC Latex sensitivity   CERVICAL FUSION  1992   CHOLECYSTECTOMY  2004   COLONOSCOPY     COLONOSCOPY N/A 05/02/2022   Procedure: COLONOSCOPY;  Surgeon: Jaynie Collins, DO;  Location: [facility] ENDOSCOPY;  Service: Gastroenterology;  Laterality: N/A;   COLONOSCOPY WITH PROPOFOL N/A 04/19/2018   Procedure: COLONOSCOPY WITH PROPOFOL;  Surgeon: Scot Jun, MD;  Location: [facility] ENDOSCOPY;  Service: Endoscopy;  Laterality: N/A;   COLONOSCOPY WITH PROPOFOL N/A 05/01/2021   Procedure: COLONOSCOPY WITH PROPOFOL;  Surgeon: Earline Mayotte, MD;  Location: [facility] ENDOSCOPY;  Service: Endoscopy;  Laterality: N/A;  ESOPHAGOGASTRODUODENOSCOPY     ESOPHAGOGASTRODUODENOSCOPY (EGD) WITH PROPOFOL N/A 04/19/2018   Procedure: ESOPHAGOGASTRODUODENOSCOPY (EGD) WITH PROPOFOL;  Surgeon: Scot Jun, MD;  Location[facility] ENDOSCOPY;  Service: Endoscopy;  Laterality: N/A;   ESOPHAGOGASTRODUODENOSCOPY (EGD) WITH PROPOFOL N/A 05/01/2021   Procedure: ESOPHAGOGASTRODUODENOSCOPY (EGD) WITH PROPOFOL;  Surgeon: Earline Mayotte, MD;  Location: [facility] ENDOSCOPY;  Service: Endoscopy;  Laterality: N/A;   feet surgery  2000   FOOT SURGERY  2014   KNEE ARTHROSCOPY WITH MENISCAL REPAIR Left 10/01/2017   Procedure: KNEE ARTHROSCOPY  PARTIAL AND LATERAL MENISCECTOMY AND CHONDROPLASTY;  Surgeon: Signa Kell, MD;  Location: [facility] SURGERY CNTR;  Service: Orthopedics;  Laterality: Left;   right ankle surgery  1977   SHOULDER ARTHROSCOPY      Social History Social History   Tobacco Use   Smoking status: Former    Current packs/day: 0.00    Average packs/day: 0.5 packs/day for 3.0 years (1.5 ttl pk-yrs)    Types: Cigarettes    Start date: 1    Quit date: 1980    Years since quitting: 44.5   Smokeless tobacco: Never   Tobacco comments:    only smoked for about 3 years   Vaping Use   Vaping status: Never Used  Substance Use Topics   Alcohol use: No   Drug use: No    Family History Family History  Problem Relation Age of Onset   Diabetes Mother    Heart disease Mother    Prostate cancer Father    Hypertension Father    Breast cancer Maternal Aunt    Lung cancer Maternal Aunt    Breast cancer Cousin    Colon cancer Cousin    Breast cancer Cousin    Breast cancer Niece     Allergies  Allergen Reactions   Iodinated Contrast Media Nausea And Vomiting and Hives   Aspirin Other (See Comments)    Bleeding ulcers in the stomach   Metrizamide Nausea And Vomiting   Nsaids Other (See Comments)    Unknown   Latex Rash     REVIEW OF SYSTEMS (Negative unless checked)  Constitutional: [] Weight loss  [] Fever  [] Chills Cardiac: [] Chest pain   [] Chest pressure   [] Palpitations   [] Shortness of breath when laying flat   [] Shortness of breath with exertion. Vascular:  [] Pain in legs with walking   [x] Pain in legs at rest  [x] History of DVT   [] Phlebitis   [x] Swelling in legs   [] Varicose veins   [] Non-healing ulcers Pulmonary:   [] Uses home oxygen   [] Productive cough   [] Hemoptysis   [] Wheeze  [] COPD   [] Asthma Neurologic:  [] Dizziness   [] Seizures   [] History of stroke   [] History of TIA  [] Aphasia   [] Vissual changes   [] Weakness or numbness in arm   [x] Weakness or numbness in leg Musculoskeletal:   [] Joint  swelling   [x] Joint pain   [] Low back pain Hematologic:  [] Easy bruising  [] Easy bleeding   [] Hypercoagulable state   [] Anemic Gastrointestinal:  [] Diarrhea   [] Vomiting  [x] Gastroesophageal reflux/heartburn   [] Difficulty swallowing. Genitourinary:  [] Chronic kidney disease   [] Difficult urination  [] Frequent urination   [] Blood in urine Skin:  [] Rashes   [] Ulcers  Psychological:  [] History of anxiety   []  History of major depression.  Physical Examination  There were no vitals filed for this visit. There is no height or weight on file to calculate BMI. Gen: WD/WN, NAD Head: [place]/AT, No temporalis wasting.  Ear/Nose/Throat: Hearing grossly intact,  nares w/o erythema or drainage, pinna without lesions Eyes: PER, EOMI, sclera nonicteric.  Neck: Supple, no gross masses.  No JVD.  Pulmonary:  Good air movement, no audible wheezing, no use of accessory muscles.  Cardiac: RRR, precordium not hyperdynamic. Vascular:  scattered varicosities present bilaterally.  Severe venous stasis changes to the feet and ankles bilaterally.  1-2+ soft pitting edema. CEAP [id number]   Vessel Right Left  Radial Palpable Palpable  Gastrointestinal: soft, non-distended. No guarding/no peritoneal signs.  Musculoskeletal: M/S 5/5 throughout.  No deformity.  Neurologic: CN 2-12 intact. Pain and light touch intact in extremities.  Symmetrical.  Speech is fluent. Motor exam as listed above. Psychiatric: Judgment intact, Mood & affect appropriate for pt's clinical situation. Dermatologic: Venous rashes no ulcers noted.  No changes consistent with cellulitis. Lymph : No lichenification or skin changes of chronic lymphedema.  CBC Lab Results  Component Value Date   WBC 5.6 05/07/2022   HGB 13.1 05/07/2022   HCT 39.9 05/07/2022   MCV 103.4 (H) 05/07/2022   PLT 240 05/07/2022    BMET    Component Value Date/Time   NA 136 05/07/2022 1310   NA 141 04/07/2012 0214   K 3.8 05/07/2022 1310   K 4.3 04/07/2012 0214    CL 102 05/07/2022 1310   CL 106 04/07/2012 0214   CO2 23 05/07/2022 1310   CO2 25 04/07/2012 0214   GLUCOSE 96 05/07/2022 1310   GLUCOSE 137 (H) 04/07/2012 0214   BUN 16 05/07/2022 1310   BUN 16 04/07/2012 0214   CREATININE 0.90 05/07/2022 1310   CREATININE 0.95 04/07/2012 0214   CALCIUM 9.4 05/07/2022 1310   CALCIUM 9.2 04/07/2012 0214   GFRNONAA >60 05/07/2022 1310   GFRNONAA >60 04/07/2012 0214   GFRAA >60 02/12/2015 1341   GFRAA >60 04/07/2012 0214   CrCl cannot be calculated (Patient's most recent lab result is older than the maximum 21 days allowed.).  COAG Lab Results  Component Value Date   INR 1.0 03/27/2021    Radiology No results found.   Assessment/Plan 1. Chronic deep vein thrombosis (DVT) of proximal vein of lower extremity, unspecified laterality (HCC) Recommend:   No surgery or intervention at this point in time.     I have reviewed my discussion with the patient regarding lymphedema and why it  causes symptoms.  Patient will continue wearing graduated compression on a daily basis. The patient should put the compression on first thing in the morning and removing them in the evening. The patient should not sleep in the compression.    In addition, behavioral modification throughout the day will be continued.  This will include frequent elevation (such as in a recliner), use of over the counter pain medications as needed and exercise such as walking.   The systemic causes for chronic edema such as liver, kidney and cardiac etiologies does not appear to have significant changed over the past year.     The patient will continue aggressive use of the  lymph pump.  This will continue to improve the edema control and prevent sequela such as ulcers and infections.    The patient will follow-up with me on a PRN basis.  2. Chronic venous insufficiency Recommend:   No surgery or intervention at this point in time.     I have reviewed my discussion with the  patient regarding lymphedema and why it  causes symptoms.  Patient will continue wearing graduated compression on a daily basis. The patient should  put the compression on first thing in the morning and removing them in the evening. The patient should not sleep in the compression.    In addition, behavioral modification throughout the day will be continued.  This will include frequent elevation (such as in a recliner), use of over the counter pain medications as needed and exercise such as walking.   The systemic causes for chronic edema such as liver, kidney and cardiac etiologies does not appear to have significant changed over the past year.    3. PAD (peripheral artery disease) (HCC) Recommend:   I do not find evidence of life style limiting vascular disease. The patient specifically denies life style limitation.   Previous noninvasive studies including ABI's of the legs do not identify critical vascular problems.   The patient should continue walking and begin a more formal exercise program. The patient should continue his antiplatelet therapy and aggressive treatment of the lipid abnormalities.   The patient is instructed to call the office if there is a significant change in the lower extremity symptoms, particularly if a wound develops or there is an abrupt increase in leg pain.  4. Gastroesophageal reflux disease, unspecified whether esophagitis present Continue PPI as already ordered, this medication has been reviewed and there are no changes at this time.  Avoidence of caffeine and alcohol  Moderate elevation of the head of the bed   5. Primary osteoarthritis of left knee Continue NSAID medications as already ordered, these medications have been reviewed and there are no changes at this time.  Continued activity and therapy was stressed.    Levora Dredge, MD  09/29/2022 10:01 AM

## 2022-09-30 ENCOUNTER — Ambulatory Visit
Admission: RE | Admit: 2022-09-30 | Discharge: 2022-09-30 | Disposition: A | Discharge status: Home / Self Care | Payer: Medicare Other | Source: Ambulatory Visit | Attending: Internal Medicine | Admitting: Internal Medicine

## 2022-09-30 DIAGNOSIS — I872 Venous insufficiency (chronic) (peripheral): Secondary | ICD-10-CM

## 2023-03-09 ENCOUNTER — Encounter: Payer: Self-pay | Admitting: Physician Assistant

## 2023-04-01 ENCOUNTER — Other Ambulatory Visit: Payer: Self-pay | Admitting: Physician Assistant

## 2023-04-01 DIAGNOSIS — Z1231 Encounter for screening mammogram for malignant neoplasm of breast: Secondary | ICD-10-CM

## 2023-04-01 DIAGNOSIS — N644 Mastodynia: Secondary | ICD-10-CM

## 2023-04-10 ENCOUNTER — Other Ambulatory Visit: Payer: Medicare Other

## 2023-04-10 ENCOUNTER — Ambulatory Visit
Admission: RE | Admit: 2023-04-10 | Discharge: 2023-04-10 | Disposition: A | Discharge status: Home / Self Care | Payer: Medicare Other | Source: Ambulatory Visit | Attending: Physician Assistant | Admitting: Physician Assistant

## 2023-04-10 DIAGNOSIS — Z1231 Encounter for screening mammogram for malignant neoplasm of breast: Secondary | ICD-10-CM

## 2023-04-10 DIAGNOSIS — N644 Mastodynia: Secondary | ICD-10-CM | POA: Diagnosis present

## 2023-05-21 ENCOUNTER — Telehealth (INDEPENDENT_AMBULATORY_CARE_PROVIDER_SITE_OTHER): Payer: Self-pay | Admitting: Vascular Surgery

## 2023-05-21 ENCOUNTER — Telehealth (INDEPENDENT_AMBULATORY_CARE_PROVIDER_SITE_OTHER): Payer: Self-pay

## 2023-05-21 NOTE — Telephone Encounter (Signed)
 Per secure chat to dr dew. pt needs to be scheduled for consult, when speaking with pt she advised she would like to see dr dew because dr dew has worked on her sister. Are you both okay with switching?

## 2023-05-21 NOTE — Telephone Encounter (Signed)
 Per secure chat, LVM for pt TCB and schedule appt  consult. see JD only. PAD. per secure chat.

## 2023-05-26 ENCOUNTER — Other Ambulatory Visit: Payer: Self-pay | Admitting: Vascular Surgery

## 2023-05-26 ENCOUNTER — Other Ambulatory Visit (INDEPENDENT_AMBULATORY_CARE_PROVIDER_SITE_OTHER)

## 2023-05-26 ENCOUNTER — Ambulatory Visit (INDEPENDENT_AMBULATORY_CARE_PROVIDER_SITE_OTHER): Admitting: Vascular Surgery

## 2023-05-26 ENCOUNTER — Encounter (INDEPENDENT_AMBULATORY_CARE_PROVIDER_SITE_OTHER): Payer: Self-pay | Admitting: Vascular Surgery

## 2023-05-26 VITALS — BP 115/70 | HR 79 | Resp 16 | Wt 183.2 lb

## 2023-05-26 DIAGNOSIS — M79604 Pain in right leg: Secondary | ICD-10-CM

## 2023-05-26 DIAGNOSIS — I70223 Atherosclerosis of native arteries of extremities with rest pain, bilateral legs: Secondary | ICD-10-CM

## 2023-05-26 DIAGNOSIS — E782 Mixed hyperlipidemia: Secondary | ICD-10-CM

## 2023-05-26 DIAGNOSIS — Z86718 Personal history of other venous thrombosis and embolism: Secondary | ICD-10-CM | POA: Diagnosis not present

## 2023-05-26 DIAGNOSIS — M79605 Pain in left leg: Secondary | ICD-10-CM

## 2023-05-26 NOTE — Progress Notes (Unsigned)
 MRN : [id number]  Jeanette Harris is a 78 y.o. (1946/02/03) female who presents with chief complaint of No chief complaint on file. Marland Kitchen  History of Present Illness: Patient returns today in follow up of ***  Current Outpatient Medications  Medication Sig Dispense Refill   acetaminophen (TYLENOL) 325 MG tablet Take 1,000 mg by mouth in the morning and at bedtime.     calcium-vitamin D (OSCAL WITH D) 500-200 MG-UNIT tablet Take 1 tablet by mouth.     Cholecalciferol (VITAMIN D3) 50 MCG (2000 UT) CAPS Take 2,000 Units by mouth.     docusate sodium (COLACE) 50 MG capsule Take 50 mg by mouth 2 (two) times daily.     lansoprazole (PREVACID) 30 MG capsule Take 30 mg by mouth daily at 12 noon.     nortriptyline (PAMELOR) 75 MG capsule Take 75 mg by mouth at bedtime.     sucralfate (CARAFATE) 1 g tablet Take 1 g by mouth 4 (four) times daily -  with meals and at bedtime.     topiramate (TOPAMAX) 100 MG tablet Take 100 mg by mouth 2 (two) times daily.     vitamin E 180 MG (400 UNITS) capsule Take by mouth.     No current facility-administered medications for this visit.    Past Medical History:  Diagnosis Date   Arthritis    Basal cell carcinoma 12/28/2019   R upper buttock   Complication of anesthesia    COPD (chronic obstructive pulmonary disease) (HCC)    patient denies   DVT (deep venous thrombosis) (HCC) 04/01/2017   left lower leg   GERD (gastroesophageal reflux disease)    Headache    Heart murmur    History of bleeding ulcers    History of colon polyps    Hypertension    IBS (irritable bowel syndrome)    Peripheral vascular disease (HCC)    phlebitis   Phlebitis    PONV (postoperative nausea and vomiting)    Skin cancer 03/2019   Right wrist BCC/SCC?   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   SCC IS, R upper arm, [facility] 10/08/21    Past Surgical History:  Procedure Laterality Date   ABDOMINAL HYSTERECTOMY     ANKLE SURGERY Right 1977   APPENDECTOMY  1987   BACK  SURGERY     1986 AND 1990   CATARACT EXTRACTION W/PHACO Left 02/13/2016   Procedure: CATARACT EXTRACTION PHACO AND INTRAOCULAR LENS PLACEMENT (IOC);  Surgeon: Lockie Mola, MD;  Location: [facility] SURGERY CNTR;  Service: Ophthalmology;  Laterality: Left;  TORIC   CATARACT EXTRACTION W/PHACO Right 02/27/2016   Procedure: CATARACT EXTRACTION PHACO AND INTRAOCULAR LENS PLACEMENT (IOC);  Surgeon: Lockie Mola, MD;  Location: [facility] SURGERY CNTR;  Service: Ophthalmology;  Laterality: Right;  TORIC Latex sensitivity   CERVICAL FUSION  1992   CHOLECYSTECTOMY  2004   COLONOSCOPY     COLONOSCOPY N/A 05/02/2022   Procedure: COLONOSCOPY;  Surgeon: Jaynie Collins, DO;  Location: [facility] ENDOSCOPY;  Service: Gastroenterology;  Laterality: N/A;   COLONOSCOPY WITH PROPOFOL N/A 04/19/2018   Procedure: COLONOSCOPY WITH PROPOFOL;  Surgeon: Scot Jun, MD;  Location: [facility] ENDOSCOPY;  Service: Endoscopy;  Laterality: N/A;   COLONOSCOPY WITH PROPOFOL N/A 05/01/2021   Procedure: COLONOSCOPY WITH PROPOFOL;  Surgeon: Earline Mayotte, MD;  Location: [facility] ENDOSCOPY;  Service: Endoscopy;  Laterality: N/A;   ESOPHAGOGASTRODUODENOSCOPY     ESOPHAGOGASTRODUODENOSCOPY (EGD) WITH PROPOFOL N/A 04/19/2018   Procedure: ESOPHAGOGASTRODUODENOSCOPY (EGD) WITH PROPOFOL;  Surgeon: Scot Jun, MD;  Location: [facility] ENDOSCOPY;  Service: Endoscopy;  Laterality: N/A;   ESOPHAGOGASTRODUODENOSCOPY (EGD) WITH PROPOFOL N/A 05/01/2021   Procedure: ESOPHAGOGASTRODUODENOSCOPY (EGD) WITH PROPOFOL;  Surgeon: Earline Mayotte, MD;  Location: [facility] ENDOSCOPY;  Service: Endoscopy;  Laterality: N/A;   feet surgery  2000   FOOT SURGERY  2014   KNEE ARTHROSCOPY WITH MENISCAL REPAIR Left 10/01/2017   Procedure: KNEE ARTHROSCOPY PARTIAL AND LATERAL MENISCECTOMY AND CHONDROPLASTY;  Surgeon: Signa Kell, MD;  Location: [facility] SURGERY CNTR;  Service: Orthopedics;  Laterality: Left;   right ankle surgery  1977   SHOULDER  ARTHROSCOPY       Social History   Tobacco Use   Smoking status: Former    Current packs/day: 0.00    Average packs/day: 0.5 packs/day for 3.0 years (1.5 ttl pk-yrs)    Types: Cigarettes    Start date: 50    Quit date: 44    Years since quitting: 45.2   Smokeless tobacco: Never   Tobacco comments:    only smoked for about 3 years   Vaping Use   Vaping status: Never Used  Substance Use Topics   Alcohol use: No   Drug use: No      Family History  Problem Relation Age of Onset   Diabetes Mother    Heart disease Mother    Prostate cancer Father    Hypertension Father    Breast cancer Maternal Aunt    Lung cancer Maternal Aunt    Breast cancer Cousin    Colon cancer Cousin    Breast cancer Cousin    Breast cancer Niece      Allergies  Allergen Reactions   Iodinated Contrast Media Nausea And Vomiting and Hives   Aspirin Other (See Comments)    Bleeding ulcers in the stomach   Metrizamide Nausea And Vomiting   Nsaids Other (See Comments)    Unknown   Latex Rash    REVIEW OF SYSTEMS (Negative unless checked)   Constitutional: [] Weight loss  [] Fever  [] Chills Cardiac: [] Chest pain   [] Chest pressure   [] Palpitations   [] Shortness of breath when laying flat   [] Shortness of breath with exertion. Vascular:  [] Pain in legs with walking   [x] Pain in legs at rest  [x] History of DVT   [] Phlebitis   [x] Swelling in legs   [] Varicose veins   [] Non-healing ulcers Pulmonary:   [] Uses home oxygen   [] Productive cough   [] Hemoptysis   [] Wheeze  [] COPD   [] Asthma Neurologic:  [] Dizziness   [] Seizures   [] History of stroke   [] History of TIA  [] Aphasia   [] Vissual changes   [] Weakness or numbness in arm   [x] Weakness or numbness in leg Musculoskeletal:   [] Joint swelling   [x] Joint pain   [] Low back pain Hematologic:  [] Easy bruising  [] Easy bleeding   [] Hypercoagulable state   [] Anemic Gastrointestinal:  [] Diarrhea   [] Vomiting  [x] Gastroesophageal reflux/heartburn    [] Difficulty swallowing. Genitourinary:  [] Chronic kidney disease   [] Difficult urination  [] Frequent urination   [] Blood in urine Skin:  [] Rashes   [] Ulcers  Psychological:  [] History of anxiety   []  History of major depression.  Physical Examination  There were no vitals taken for this visit. Gen:  WD/WN, NAD Head: [place]/AT, No temporalis wasting. Ear/Nose/Throat: Hearing grossly intact, nares w/o erythema or drainage Eyes: Conjunctiva clear. Sclera non-icteric Neck: Supple.  Trachea midline Pulmonary:  Good air movement, no use of accessory muscles.  Cardiac: RRR, no JVD  Vascular:  Vessel Right Left  Radial Palpable Palpable                          PT Palpable Palpable  DP Palpable Palpable   Gastrointestinal: soft, non-tender/non-distended. No guarding/reflex.  Musculoskeletal: M/S 5/5 throughout.  No deformity or atrophy. *** edema. Neurologic: Sensation grossly intact in extremities.  Symmetrical.  Speech is fluent.  Psychiatric: Judgment intact, Mood & affect appropriate for pt's clinical situation. Dermatologic: No rashes or ulcers noted.  No cellulitis or open wounds.      Labs No results found for this or any previous visit (from the past 2160 hours).  Radiology No results found.  Assessment/Plan  No problem-specific Assessment & Plan notes found for this encounter.    Festus Barren, MD  05/26/2023 8:27 AM    This note was created with Dragon medical transcription system.  Any errors from dictation are purely unintentional

## 2023-05-28 DIAGNOSIS — M79609 Pain in unspecified limb: Secondary | ICD-10-CM | POA: Insufficient documentation

## 2023-05-28 LAB — VAS US ABI WITH/WO TBI
Left ABI: 1.21
Right ABI: 1.19

## 2023-05-28 NOTE — Assessment & Plan Note (Signed)
 No current DVT.  Postphlebitic symptoms could be contributing to discoloration as well as some discomfort of the lower extremities.  She cannot tolerate compression socks due to her neuropathy.

## 2023-05-28 NOTE — Assessment & Plan Note (Signed)
 lipid control important in reducing the progression of atherosclerotic disease. Continue statin therapy

## 2023-05-28 NOTE — Assessment & Plan Note (Signed)
 We did noninvasive studies today showing a right ABI of 1.19 and a left ABI of 1.21.  Her waveforms were triphasic.  Her digit pressures were slightly reduced at 97 on the right and 83 on the left.  Given these findings, I think it is much more likely that her pain is neuropathic than arterial.  That being said, her digit pressures are reduced and I think an angiogram is an option if she would like to pursue this for further evaluation.  She does not want to have an angiogram at this time and says she has lived with the pain for years and she will continue to.  I said that is certainly reasonable and she does not have any limb threatening ischemia.  She likely has a scheduled follow-up appointment later this year and can keep this visit.

## 2023-06-14 IMAGING — MG MM DIGITAL SCREENING BILAT W/ TOMO AND CAD
8 series · 8 of 24 positions shown · non-contrast
Comparison: Previous exam(s).

CLINICAL DATA: Screening.

EXAM:
DIGITAL SCREENING BILATERAL MAMMOGRAM WITH TOMOSYNTHESIS AND CAD
TECHNIQUE: Bilateral screening digital craniocaudal and mediolateral oblique
mammograms were obtained. Bilateral screening digital breast
tomosynthesis was performed. The images were evaluated with
computer-aided detection.

[R CC synth-2D]
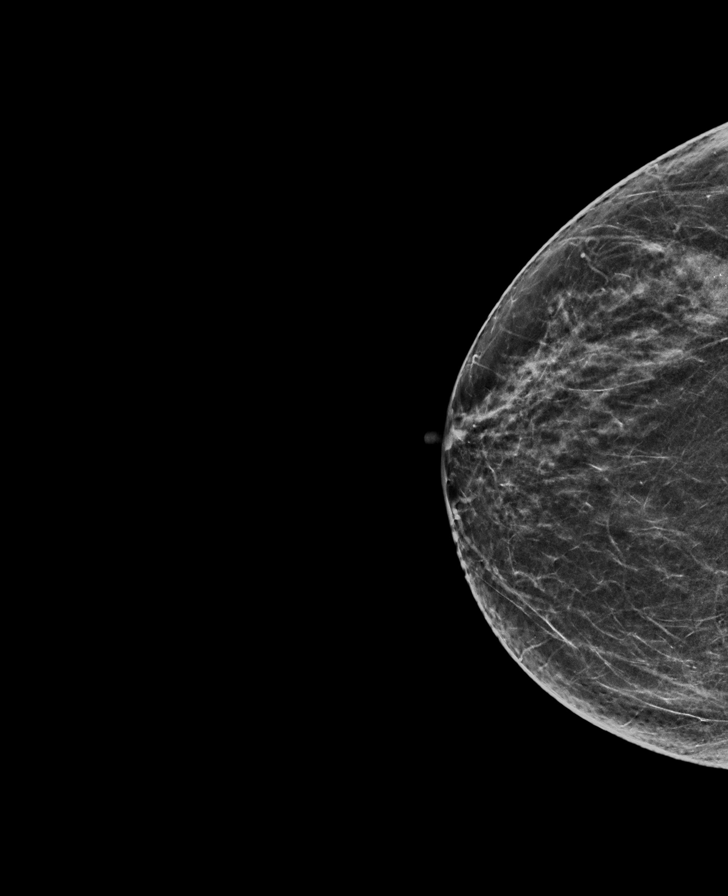

[R MLO synth-2D]
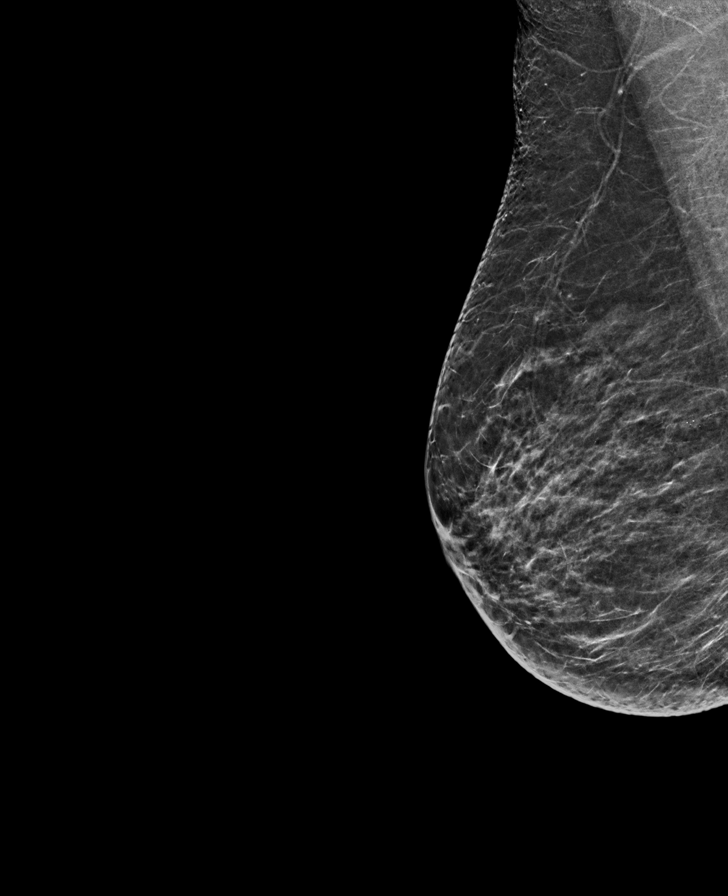

[L MLO synth-2D]
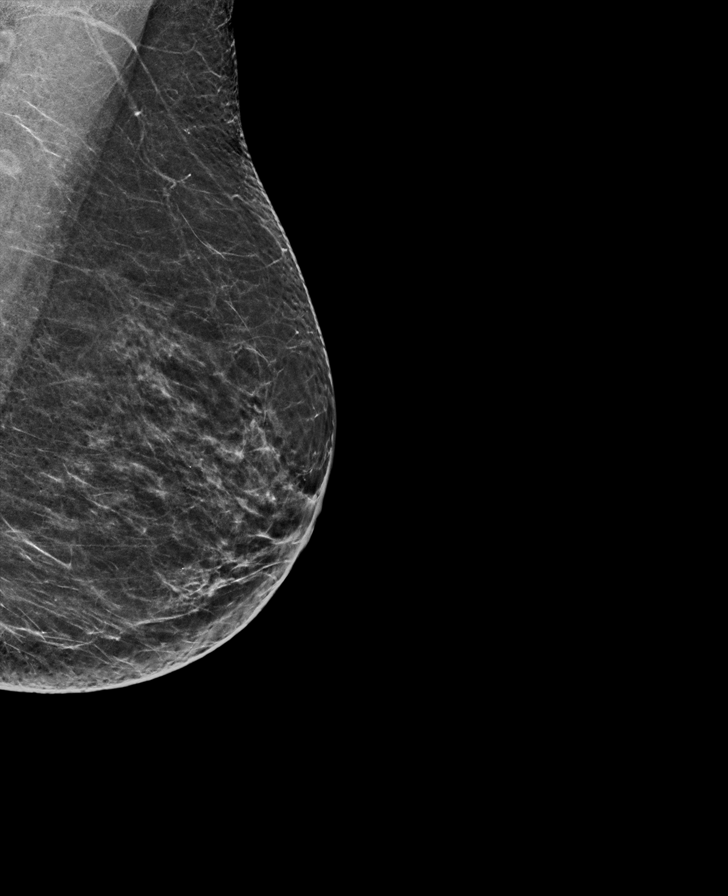

[L CC synth-2D]
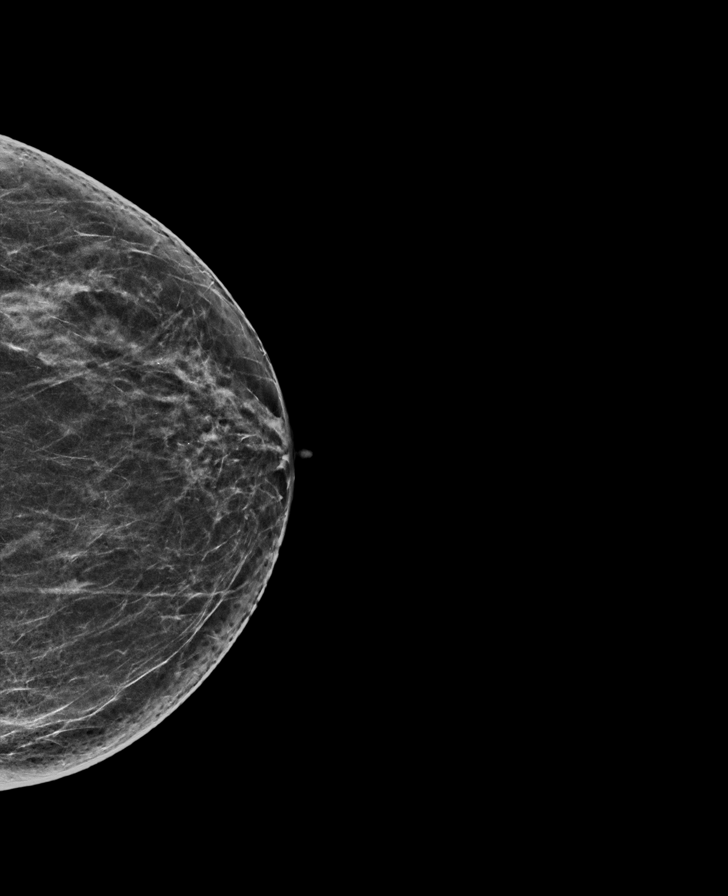

[L MLO tomo · tomo slice 33/66.0]
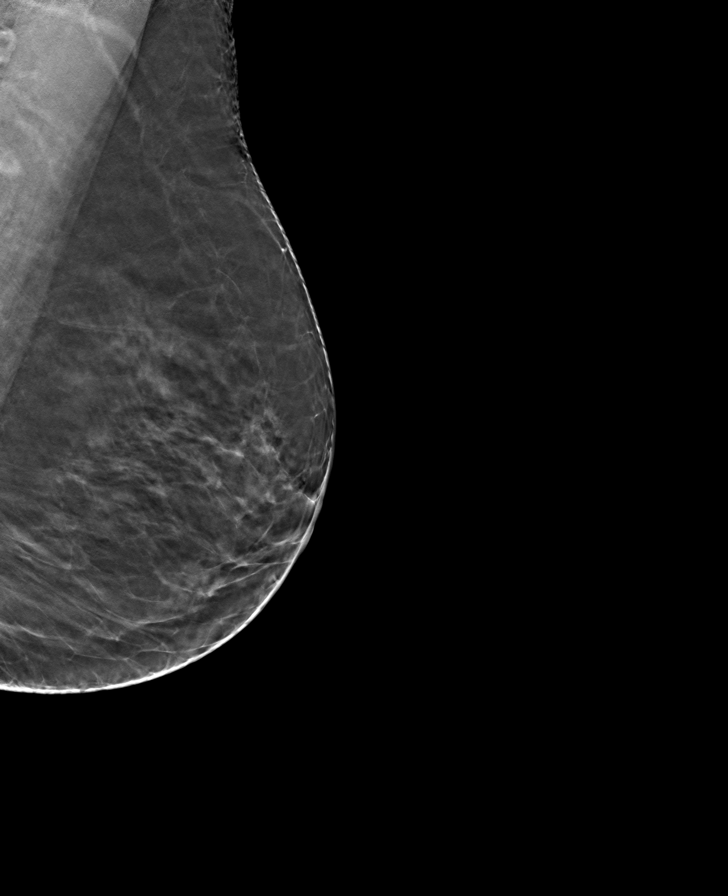

[L CC tomo · tomo slice 31/60.0]
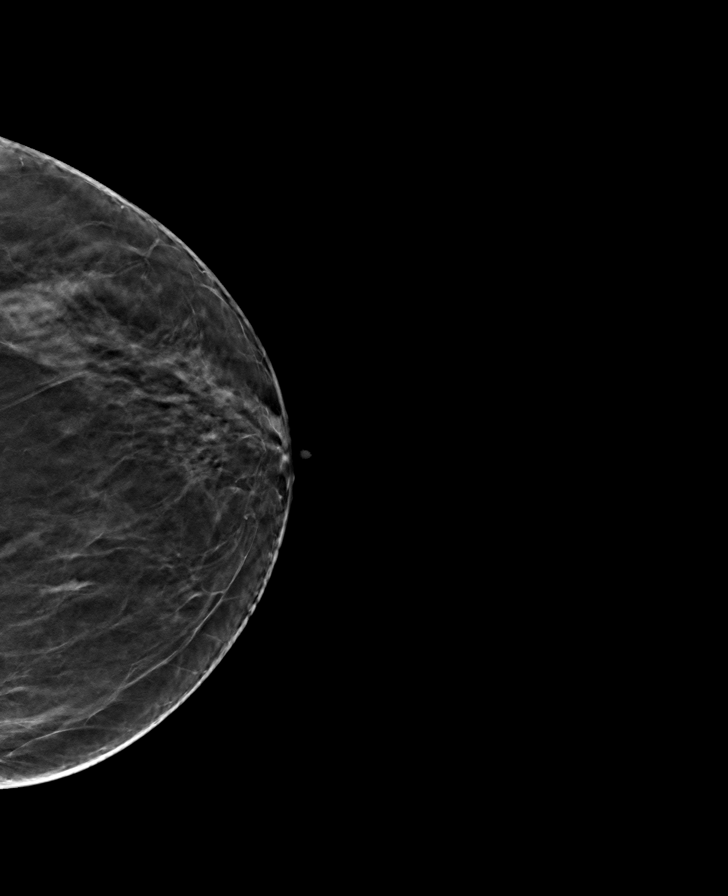

[R MLO tomo · tomo slice 32/63.0]
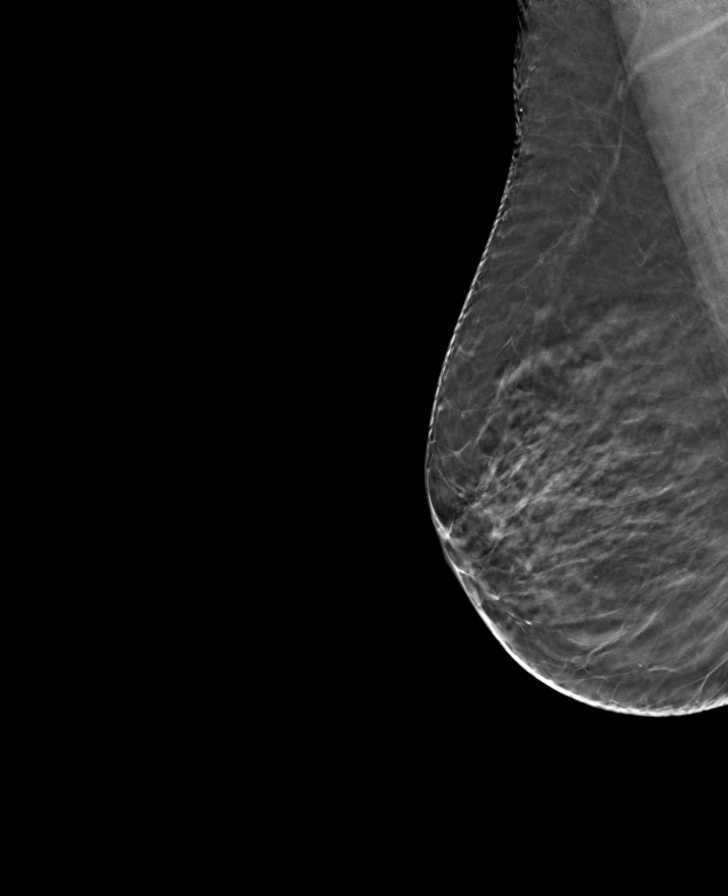

[R CC tomo · tomo slice 30/59.0]
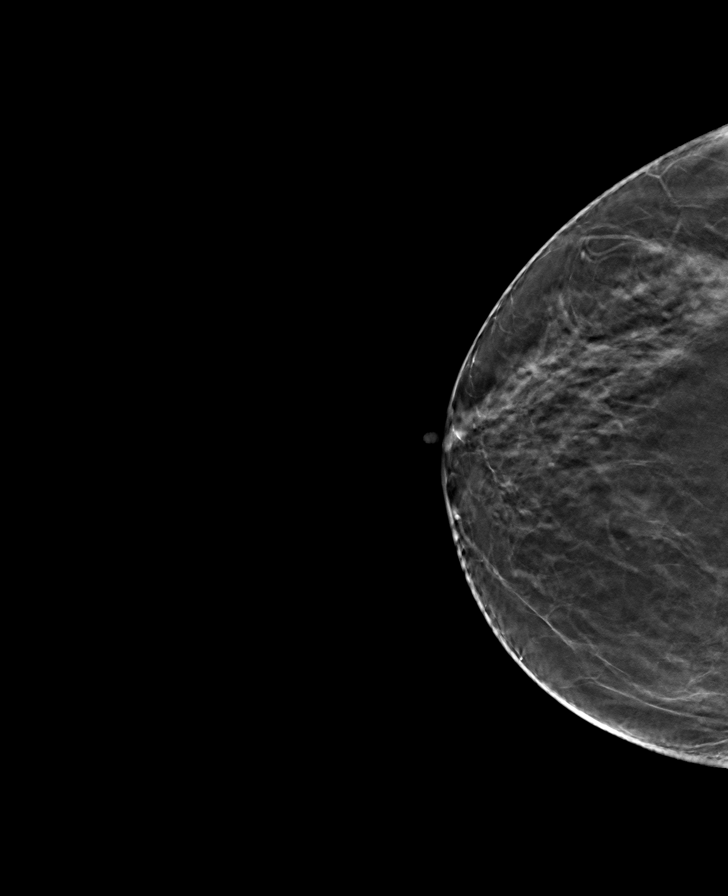

[8 of 24 positions shown; findings below may reference images not displayed]

ACR Breast Density Category b: There are scattered areas of
fibroglandular density.
FINDINGS: There are no findings suspicious for malignancy.
IMPRESSION: No mammographic evidence of malignancy. A result letter of this
screening mammogram will be mailed directly to the patient.

RECOMMENDATION:
Screening mammogram in one year. (Code:51-O-LD2)

BI-RADS CATEGORY  1: Negative.

## 2023-12-03 ENCOUNTER — Ambulatory Visit (INDEPENDENT_AMBULATORY_CARE_PROVIDER_SITE_OTHER)

## 2023-12-03 ENCOUNTER — Ambulatory Visit

## 2023-12-03 DIAGNOSIS — M79671 Pain in right foot: Secondary | ICD-10-CM

## 2023-12-03 DIAGNOSIS — I739 Peripheral vascular disease, unspecified: Secondary | ICD-10-CM

## 2023-12-03 DIAGNOSIS — M79672 Pain in left foot: Secondary | ICD-10-CM

## 2023-12-03 DIAGNOSIS — I872 Venous insufficiency (chronic) (peripheral): Secondary | ICD-10-CM | POA: Diagnosis not present

## 2023-12-03 DIAGNOSIS — G629 Polyneuropathy, unspecified: Secondary | ICD-10-CM | POA: Diagnosis not present

## 2023-12-03 DIAGNOSIS — I890 Lymphedema, not elsewhere classified: Secondary | ICD-10-CM

## 2023-12-03 DIAGNOSIS — Z86718 Personal history of other venous thrombosis and embolism: Secondary | ICD-10-CM

## 2023-12-03 DIAGNOSIS — R202 Paresthesia of skin: Secondary | ICD-10-CM

## 2023-12-03 DIAGNOSIS — M79604 Pain in right leg: Secondary | ICD-10-CM

## 2023-12-03 DIAGNOSIS — M79605 Pain in left leg: Secondary | ICD-10-CM

## 2023-12-03 NOTE — Progress Notes (Signed)
 Subjective:  Patient ID: Jeanette Harris, female    DOB: 1945-04-22,  MRN: [id number]  Bilateral foot pain  78 y.o. female presents with the above complaint.  Patient states that she has had significant burning, tingling type pains to bilateral lower extremities for extended period of time.  In her calves, she gets significant soreness.  She has been diagnosed with chronic venous stasis with associated hyperpigmentation.  She has been previously treated with lymphedema pumps, compression socks and other modalities.  She does not tolerate the lymphedema pumps or compression socks. She does have a history of DVT and is on antiplatelet therapy.  A1C 6.1%  Review of Systems: Negative except as noted in the HPI. Denies N/V/F/Ch.  Past Medical History:  Diagnosis Date   Arthritis    Basal cell carcinoma 12/28/2019   R upper buttock   Complication of anesthesia    COPD (chronic obstructive pulmonary disease) (HCC)    patient denies   DVT (deep venous thrombosis) (HCC) 04/01/2017   left lower leg   GERD (gastroesophageal reflux disease)    Headache    Heart murmur    History of bleeding ulcers    History of colon polyps    Hypertension    IBS (irritable bowel syndrome)    Peripheral vascular disease    phlebitis   Phlebitis    PONV (postoperative nausea and vomiting)    Skin cancer 03/2019   Right wrist BCC/SCC?   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   SCC IS, R upper arm, EDC 10/08/21    Current Outpatient Medications:    acetaminophen  (TYLENOL ) 325 MG tablet, Take 1,000 mg by mouth in the morning and at bedtime., Disp: , Rfl:    calcium-vitamin D (OSCAL WITH D) 500-200 MG-UNIT tablet, Take 1 tablet by mouth., Disp: , Rfl:    Cholecalciferol (VITAMIN D3) 50 MCG (2000 UT) CAPS, Take 2,000 Units by mouth., Disp: , Rfl:    cilostazol (PLETAL) 50 MG tablet, Take 1 tablet by mouth 2 (two) times daily., Disp: , Rfl:    docusate sodium (COLACE) 50 MG capsule, Take 50 mg by mouth 2  (two) times daily., Disp: , Rfl:    lansoprazole (PREVACID) 30 MG capsule, Take 30 mg by mouth daily at 12 noon., Disp: , Rfl:    nortriptyline  (PAMELOR ) 75 MG capsule, Take 75 mg by mouth at bedtime., Disp: , Rfl:    rosuvastatin (CRESTOR) 10 MG tablet, Take 1 tablet by mouth daily., Disp: , Rfl:    sucralfate (CARAFATE) 1 g tablet, Take 1 g by mouth 4 (four) times daily -  with meals and at bedtime., Disp: , Rfl:    topiramate  (TOPAMAX ) 100 MG tablet, Take 100 mg by mouth 2 (two) times daily., Disp: , Rfl:    vitamin E 180 MG (400 UNITS) capsule, Take by mouth., Disp: , Rfl:   Social History   Tobacco Use  Smoking Status Former   Current packs/day: 0.00   Average packs/day: 0.5 packs/day for 3.0 years (1.5 ttl pk-yrs)   Types: Cigarettes   Start date: 81   Quit date: 1980   Years since quitting: 45.7  Smokeless Tobacco Never  Tobacco Comments   only smoked for about 3 years     Allergies  Allergen Reactions   Iodinated Contrast Media Nausea And Vomiting and Hives   Aspirin Other (See Comments)    Bleeding ulcers in the stomach   Metrizamide Nausea And Vomiting   Nsaids Other (See Comments)  Unknown   Latex Rash   Objective:  There were no vitals filed for this visit. There is no height or weight on file to calculate BMI. Constitutional Well developed. Well nourished. Oriented to person, place, and time.  Vascular Dorsalis pedis pulses palpable bilaterally. Posterior tibial pulses palpable bilaterally. Capillary refill under 5 seconds to all digits.  Toes are cool to touch. No cyanosis or clubbing noted. Pedal hair growth normal.  Neurologic Normal speech. Epicritic sensation to light touch grossly present bilaterally. Negative tinel sign at tarsal tunnel bilaterally.   Dermatologic Skin texture and turgor are within normal limits.  No open wounds. Skin distal to the ankle throughout the foot and toes is hyperpigmented consistent with chronic venous stasis  changes as well as varicose veins.  Minimal ankle and calf edema, patient states this is lower than normal.  Musculoskeletal: 5 out of 5 muscle strength in all major pedal muscle groups.  Rectus foot shape bilaterally.    Radiographs: Taken and reviewed.  3 views of bilateral feet were taken.  Bilateral feet demonstrate rectus foot shape with well-preserved joint spaces.  Os peroneum present bilaterally.  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  No acute osseous fractures or other pathology identified.   ABI 05/26/23 +-------+-----------+-----------+------------+------------+  ABI/TBIToday's ABIToday's TBIPrevious ABIPrevious TBI  +-------+-----------+-----------+------------+------------+  Right 1.19       0.69       1.30        0.90          +-------+-----------+-----------+------------+------------+  Left  1.21       0.59       1.36        1.05          +-------+-----------+-----------+------------+------------+   Bilateral ABIs appear essentially unchanged compared to prior study on  06/25/2020.    Summary:  Right: Resting right ankle-brachial index is within normal range. The  right toe-brachial index is abnormal.   Left: Resting left ankle-brachial index is within normal range. The left  toe-brachial index is abnormal.   Assessment:   1. Foot pain, bilateral   2. Chronic venous insufficiency   3. PAD (peripheral artery disease)   4. History of DVT (deep vein thrombosis)   5. Pain in both lower extremities   6. Lymphedema   7. Leg paresthesia   8. Peripheral polyneuropathy    Plan:  - Patient was evaluated and treated and all questions answered.  Foot pain, bilateral Chronic venous insufficiency Neuropathy Peripheral Arterial Disease -Discussed with patient the diagnosis of foot pain likely due to a combination of neuropathy and vascular contributions. She does not have any open wounds.  - The patient  has been worked up by vein and vascular, she reports that no further intervention can be done -We discussed multiple treatment modalities.  Patient states that she has already attempted the vast majority of these including compression therapy, gabapentin, lymphedema treatment.  She has not had success with any of these. -We did discuss the option of doing a compound cream for her neuropathy.  I discussed that this will not cause improved sensation but may desensitize the nerves to decrease her pain.  She expresses understanding of this and wishes to proceed.  If she develops any open wounds, she is not to place this over the wounds. Discussed with compounding pharamcist at [facility]. Peripheral neuropathy cream 120g ordered from [facility] drug store.    RTC as needed  Prentice Ovens, DPM AACFAS  Fellowship Trained Podiatric Surgeon [facility]

## 2023-12-04 ENCOUNTER — Ambulatory Visit

## 2024-02-11 ENCOUNTER — Ambulatory Visit

## 2024-03-07 ENCOUNTER — Other Ambulatory Visit: Payer: Self-pay | Admitting: Physician Assistant

## 2024-03-07 DIAGNOSIS — Z1231 Encounter for screening mammogram for malignant neoplasm of breast: Secondary | ICD-10-CM

## 2024-04-12 ENCOUNTER — Ambulatory Visit

## 2024-05-05 ENCOUNTER — Ambulatory Visit
